# Patient Record
Sex: Female | Born: 1960 | Race: Black or African American | Hispanic: No | Smoking: Light tobacco smoker
Health system: Southern US, Community
[De-identification: ages and names within clinical notes are randomized; demographics above are authoritative.]

## PROBLEM LIST (undated history)

## (undated) DIAGNOSIS — I10 Essential (primary) hypertension: Secondary | ICD-10-CM

## (undated) DIAGNOSIS — I639 Cerebral infarction, unspecified: Secondary | ICD-10-CM

## (undated) HISTORY — PX: CHOLECYSTECTOMY: SHX55

---

## 2018-06-13 ENCOUNTER — Emergency Department (HOSPITAL_COMMUNITY): Payer: Medicaid Other

## 2018-06-13 ENCOUNTER — Emergency Department (HOSPITAL_COMMUNITY)
Admission: EM | Admit: 2018-06-13 | Discharge: 2018-06-14 | Disposition: A | Discharge status: Home / Self Care | Payer: Medicaid Other | Attending: Emergency Medicine | Admitting: Emergency Medicine

## 2018-06-13 ENCOUNTER — Encounter (HOSPITAL_COMMUNITY): Payer: Self-pay | Admitting: Emergency Medicine

## 2018-06-13 DIAGNOSIS — I10 Essential (primary) hypertension: Secondary | ICD-10-CM | POA: Diagnosis not present

## 2018-06-13 DIAGNOSIS — R0789 Other chest pain: Secondary | ICD-10-CM

## 2018-06-13 DIAGNOSIS — N939 Abnormal uterine and vaginal bleeding, unspecified: Secondary | ICD-10-CM | POA: Diagnosis not present

## 2018-06-13 DIAGNOSIS — R079 Chest pain, unspecified: Secondary | ICD-10-CM | POA: Diagnosis not present

## 2018-06-13 DIAGNOSIS — G44209 Tension-type headache, unspecified, not intractable: Secondary | ICD-10-CM

## 2018-06-13 DIAGNOSIS — D649 Anemia, unspecified: Secondary | ICD-10-CM | POA: Diagnosis not present

## 2018-06-13 DIAGNOSIS — R1030 Lower abdominal pain, unspecified: Secondary | ICD-10-CM | POA: Insufficient documentation

## 2018-06-13 DIAGNOSIS — K439 Ventral hernia without obstruction or gangrene: Secondary | ICD-10-CM | POA: Diagnosis not present

## 2018-06-13 DIAGNOSIS — R0602 Shortness of breath: Secondary | ICD-10-CM | POA: Diagnosis not present

## 2018-06-13 DIAGNOSIS — G4489 Other headache syndrome: Secondary | ICD-10-CM | POA: Diagnosis not present

## 2018-06-13 HISTORY — DX: Cerebral infarction, unspecified: I63.9

## 2018-06-13 HISTORY — DX: Essential (primary) hypertension: I10

## 2018-06-13 NOTE — ED Triage Notes (Signed)
Pt c/o non-radiating chest pain that started this morning at 1100, headache started at 1900 tonight. Denies nausea/vomiting/diaphoresis. Hx HTN/ CVA with right sided deficits. Given 324mg  aspirin PTA.

## 2018-06-14 ENCOUNTER — Emergency Department (HOSPITAL_COMMUNITY): Payer: Medicaid Other

## 2018-06-14 DIAGNOSIS — R0602 Shortness of breath: Secondary | ICD-10-CM | POA: Diagnosis not present

## 2018-06-14 DIAGNOSIS — R079 Chest pain, unspecified: Secondary | ICD-10-CM | POA: Diagnosis not present

## 2018-06-14 DIAGNOSIS — K439 Ventral hernia without obstruction or gangrene: Secondary | ICD-10-CM | POA: Diagnosis not present

## 2018-06-14 DIAGNOSIS — R279 Unspecified lack of coordination: Secondary | ICD-10-CM | POA: Diagnosis not present

## 2018-06-14 DIAGNOSIS — Z743 Need for continuous supervision: Secondary | ICD-10-CM | POA: Diagnosis not present

## 2018-06-14 LAB — BASIC METABOLIC PANEL
Anion gap: 9 (ref 5–15)
BUN: 13 mg/dL (ref 6–20)
CHLORIDE: 106 mmol/L (ref 98–111)
CO2: 22 mmol/L (ref 22–32)
CREATININE: 1.77 mg/dL — AB (ref 0.44–1.00)
Calcium: 8.4 mg/dL — ABNORMAL LOW (ref 8.9–10.3)
GFR calc Af Amer: 36 mL/min — ABNORMAL LOW (ref >60)
GFR calc non Af Amer: 31 mL/min — ABNORMAL LOW (ref >60)
GLUCOSE: 105 mg/dL — AB (ref 70–99)
Potassium: 4 mmol/L (ref 3.5–5.1)
Sodium: 137 mmol/L (ref 135–145)

## 2018-06-14 LAB — CBC
HCT: 33.7 % — ABNORMAL LOW (ref 36.0–46.0)
Hemoglobin: 9.9 g/dL — ABNORMAL LOW (ref 12.0–15.0)
MCH: 25.4 pg — AB (ref 26.0–34.0)
MCHC: 29.4 g/dL — ABNORMAL LOW (ref 30.0–36.0)
MCV: 86.6 fL (ref 78.0–100.0)
PLATELETS: 259 10*3/uL (ref 150–400)
RBC: 3.89 MIL/uL (ref 3.87–5.11)
RDW: 15.9 % — ABNORMAL HIGH (ref 11.5–15.5)
WBC: 8.5 10*3/uL (ref 4.0–10.5)

## 2018-06-14 LAB — I-STAT BETA HCG BLOOD, ED (MC, WL, AP ONLY): I-stat hCG, quantitative: <5 m[IU]/mL (ref <5)

## 2018-06-14 LAB — HEPATIC FUNCTION PANEL
ALT: 16 U/L (ref 0–44)
AST: 14 U/L — ABNORMAL LOW (ref 15–41)
Albumin: 3.2 g/dL — ABNORMAL LOW (ref 3.5–5.0)
Alkaline Phosphatase: 67 U/L (ref 38–126)
Total Bilirubin: 0.6 mg/dL (ref 0.3–1.2)
Total Protein: 7.5 g/dL (ref 6.5–8.1)

## 2018-06-14 LAB — I-STAT TROPONIN, ED
TROPONIN I, POC: 0.01 ng/mL (ref 0.00–0.08)
Troponin i, poc: 0 ng/mL (ref 0.00–0.08)

## 2018-06-14 LAB — LIPASE, BLOOD: LIPASE: 25 U/L (ref 11–51)

## 2018-06-14 MED ORDER — AMLODIPINE BESYLATE 5 MG PO TABS: 5.0000 mg | ORAL_TABLET | Freq: Every day | ORAL | 0 refills | Status: AC

## 2018-06-14 MED ORDER — AMLODIPINE BESYLATE 5 MG PO TABS
5.0000 mg | ORAL_TABLET | Freq: Once | ORAL | Status: AC
  Administered 2018-06-14: 5 mg via ORAL
  Filled 2018-06-14: qty 1

## 2018-06-14 MED ORDER — PROCHLORPERAZINE EDISYLATE 10 MG/2ML IJ SOLN
10.0000 mg | Freq: Once | INTRAMUSCULAR | Status: AC
  Administered 2018-06-14: 10 mg via INTRAVENOUS
  Filled 2018-06-14: qty 2

## 2018-06-14 MED ORDER — HYDROCHLOROTHIAZIDE 25 MG PO TABS: 25.0000 mg | ORAL_TABLET | Freq: Every day | ORAL | Status: DC

## 2018-06-14 MED ORDER — DIPHENHYDRAMINE HCL 50 MG/ML IJ SOLN
25.0000 mg | Freq: Once | INTRAMUSCULAR | Status: AC
  Administered 2018-06-14: 25 mg via INTRAVENOUS
  Filled 2018-06-14: qty 1

## 2018-06-14 NOTE — ED Notes (Signed)
Patient transported to CT 

## 2018-06-14 NOTE — ED Notes (Signed)
Signature pad unavailable at time of pt discharge. Pt verbalized understanding of instructions and prescriptions.

## 2018-06-14 NOTE — Discharge Instructions (Addendum)
He was seen today for multiple complaints.  Your work-up is largely reassuring.  You were found to be hypertensive.  You will be started on Norvasc.  You need to establish primary care for blood pressure recheck and adjustment of medications.  Additionally you should follow-up at [facility] given your vaginal bleeding.  You were found to be anemic but her vital signs were otherwise reassuring.  Increase iron intake.  If you have any new or worsening symptoms you should be reevaluated immediately.

## 2018-06-14 NOTE — ED Notes (Signed)
ED Provider at bedside. 

## 2018-06-14 NOTE — ED Provider Notes (Signed)
[place] EMERGENCY DEPARTMENT Provider Note   CSN: [id number] Arrival date & time: 06/13/18  2327     History   Chief Complaint Chief Complaint  Patient presents with  . Chest Pain    HPI Cristina Jordan is a 57 y.o. female.  HPI  This is a 57 year old female with history of morbid obesity, hypertension, stroke who presents with multiple complaints.  Patient reports chest pain, headache, and vaginal bleeding.  Patient reports today she had onset of anterior chest pain.  It was nonradiating.  Describes it as pressure.  No exertional symptoms.  Denies any shortness of breath or cough.  She additionally reports headache onset this afternoon.  It is frontal and nonradiating.  Rates her pain at 10 out of 10.  She took an Excedrin with no relief.  Denies neck pain or fevers.  Additionally, patient states that 10 years ago she had a "procedure" and has not had a.  Since that time.  However 2 to 3 months ago she began to have vaginal bleeding.  At times it is heavy with clots.  Sometimes she will go a day or 2 without bleeding but she has had continuous bleeding over the last 2 to 3 months.  She states in general she has not felt well since that time.  She does report some crampy lower abdominal pain.  Denies any nausea, vomiting, diarrhea.  Past Medical History:  Diagnosis Date  . Hypertension   . Stroke [facility])     There are no active problems to display for this patient.   Past Surgical History:  Procedure Laterality Date  . CHOLECYSTECTOMY       OB History   None      Home Medications    Prior to Admission medications   Medication Sig Start Date End Date Taking? Authorizing Provider  amLODipine (NORVASC) 5 MG tablet Take 1 tablet (5 mg total) by mouth daily. 06/14/18   Horton, Barbette Hair, MD    Family History No family history on file.  Social History Social History   Tobacco Use  . Smoking status: Never Smoker  . Smokeless tobacco: Never Used    Substance Use Topics  . Alcohol use: Not Currently    Frequency: Never  . Drug use: Never     Allergies   Patient has no known allergies.   Review of Systems Review of Systems  Constitutional: Negative for fever.  Eyes: Negative for photophobia.  Respiratory: Negative for shortness of breath.   Cardiovascular: Positive for chest pain. Negative for leg swelling.  Gastrointestinal: Positive for abdominal pain. Negative for diarrhea, nausea and vomiting.  Genitourinary: Positive for vaginal bleeding.  Musculoskeletal: Negative for neck pain and neck stiffness.  Skin: Negative for color change and wound.  Neurological: Positive for headaches. Negative for dizziness, weakness and numbness.  All other systems reviewed and are negative.    Physical Exam Updated Vital Signs BP (!) 177/74   Pulse 84   Temp 98.5 F (36.9 C) (Oral)   Resp 19   Ht 5\' 6"  (1.676 m)   Wt (!) 178.3 kg (393 lb)   SpO2 100%   BMI 63.43 kg/m   Physical Exam  Constitutional: She is oriented to person, place, and time.  Chronically ill-appearing, morbidly obese  HENT:  Head: Normocephalic and atraumatic.  Eyes: Pupils are equal, round, and reactive to light.  Neck: Normal range of motion. Neck supple.  Cardiovascular: Normal rate, regular rhythm, normal heart sounds and  normal pulses.  Pulmonary/Chest: Effort normal. No respiratory distress. She has no wheezes.  Limited secondary to body habitus  Abdominal: Soft. Bowel sounds are normal. There is no tenderness.  Musculoskeletal:       Right lower leg: She exhibits edema.       Left lower leg: She exhibits edema.  Neurological: She is alert and oriented to person, place, and time.  Speech is somewhat slowed, 5 out of 5 strength bilateral upper and lower extremities  Skin: Skin is warm and dry.  Psychiatric: She has a normal mood and affect.  Nursing note and vitals reviewed.    ED Treatments / Results  Labs (all labs ordered are listed, but  only abnormal results are displayed) Labs Reviewed  BASIC METABOLIC PANEL - Abnormal; Notable for the following components:      Result Value   Glucose, Bld 105 (*)    Creatinine, Ser 1.77 (*)    Calcium 8.4 (*)    GFR calc non Af Amer 31 (*)    GFR calc Af Amer 36 (*)    All other components within normal limits  CBC - Abnormal; Notable for the following components:   Hemoglobin 9.9 (*)    HCT 33.7 (*)    MCH 25.4 (*)    MCHC 29.4 (*)    RDW 15.9 (*)    All other components within normal limits  HEPATIC FUNCTION PANEL - Abnormal; Notable for the following components:   Albumin 3.2 (*)    AST 14 (*)    All other components within normal limits  LIPASE, BLOOD  I-STAT TROPONIN, ED  I-STAT BETA HCG BLOOD, ED ([facility], [facility], [facility] ONLY)  I-STAT TROPONIN, ED    EKG EKG Interpretation  Date/Time:  Tuesday June 13 2018 23:30:43 EDT Ventricular Rate:  92 PR Interval:    QRS Duration: 109 QT Interval:  396 QTC Calculation: 490 R Axis:   43 Text Interpretation:  Sinus rhythm Borderline prolonged PR interval Low voltage, precordial leads Borderline prolonged QT interval No prior for comparison Confirmed by Thayer Jew [phone]) on 06/14/2018 12:12:41 AM   Radiology Ct Abdomen Pelvis Wo Contrast  Result Date: 06/14/2018 CLINICAL DATA:  Chest and left-sided abdominal pain EXAM: CT ABDOMEN AND PELVIS WITHOUT CONTRAST TECHNIQUE: Multidetector CT imaging of the abdomen and pelvis was performed following the standard protocol without IV contrast. COMPARISON:  Chest x-ray 06/13/2018 FINDINGS: Lower chest: Lung bases demonstrate no acute consolidation or effusion. Mild cardiomegaly. Small hiatal hernia Hepatobiliary: No focal liver abnormality is seen. Status post cholecystectomy. No biliary dilatation. Pancreas: Unremarkable. No pancreatic ductal dilatation or surrounding inflammatory changes. Spleen: Normal in size without focal abnormality. Adrenals/Urinary Tract: Adrenal glands are unremarkable.  Kidneys are normal, without renal calculi, focal lesion, or hydronephrosis. Bladder is unremarkable. Stomach/Bowel: Stomach is within normal limits. Appendix appears normal. No evidence of bowel wall thickening, distention, or inflammatory changes. Sigmoid colon diverticular disease without acute inflammation. Vascular/Lymphatic: Mild aortic atherosclerosis without aneurysm. No significantly enlarged lymph nodes Reproductive: Calcified fibroid within the left uterus. No adnexal mass. Other: Negative for free air or free fluid. Umbilical and periumbilical hernia. Midline ventral hernia contains fat. Right paramedian hernia contains a small portion of small bowel. No obstruction identified. Musculoskeletal: Degenerative changes. No acute or suspicious abnormality IMPRESSION: 1. No CT evidence for acute intra-abdominal or pelvic abnormality 2. Mild cardiomegaly 3. Sigmoid colon diverticular disease without acute inflammation 4. Calcified fibroid in the uterus 5. Ventral hernias. Right paramedian ventral hernia contains a small  amount of small bowel but there is no evidence for obstruction or incarceration. Electronically Signed   By: Donavan Foil M.D.   On: 06/14/2018 02:36   Dg Chest 2 View  Result Date: 06/14/2018 CLINICAL DATA:  Chest pain and shortness of breath for 1 day. History of stroke, hypertension, nonsmoker. EXAM: CHEST - 2 VIEW COMPARISON:  None. FINDINGS: Shallow inspiration with vascular crowding in the lung bases. Heart size is prominent but likely normal for technique. No vascular congestion. No edema or consolidation. No blunting of costophrenic angles. No pneumothorax. Mediastinal contours appear intact. IMPRESSION: No active cardiopulmonary disease. Electronically Signed   By: Lucienne Capers M.D.   On: 06/14/2018 00:08    Procedures Procedures (including critical care time)  Medications Ordered in ED Medications  hydrochlorothiazide (HYDRODIURIL) tablet 25 mg (has no administration in  time range)  prochlorperazine (COMPAZINE) injection 10 mg (10 mg Intravenous Given 06/14/18 0021)  diphenhydrAMINE (BENADRYL) injection 25 mg (25 mg Intravenous Given 06/14/18 0021)  amLODipine (NORVASC) tablet 5 mg (5 mg Oral Given 06/14/18 0413)     Initial Impression / Assessment and Plan / ED Course  I have reviewed the triage vital signs and the nursing notes.  Pertinent labs & imaging results that were available during my care of the patient were reviewed by me and considered in my medical decision making (see chart for details).  Clinical Course as of Jun 15 447  Wed Jun 14, 2018  0405 She reports improvement of headache with migraine cocktail.  She is overall nontoxic-appearing.  She has been hypertensive.  Reports that her medications got lost during her move.  She is unsure what she used to take.  We will have pharmacy evaluate.   [CH]    Clinical Course User Index [CH] Horton, Barbette Hair, MD    Patient presents with multiple complaints.  She has never been seen in our system headache and chest pain is her primary complaints.  Also reports ongoing vaginal bleeding for the last 2 months.  She is overall nontoxic-appearing.  Notably hypertensive upon arrival.  Patient was given a migraine cocktail.  No other red flags of headache.  No signs or symptoms of meningitis.  Doubt subarachnoid hemorrhage.  Regarding her chest pain, she certainly has risk factors for ACS including obesity, hypertension.  EKG shows no evidence of acute ischemia.  Chest x-ray without pneumothorax or pneumonia.  Troponin x2-.  Basic lab work reveals a hemoglobin of 9.9.  Unknown baseline.  She does not appear symptomatic as her heart rate is 88 and her blood pressure is reassuring.  Additionally she has creatinine of 1.77.  Again, unknown baseline.  Patient remained hypertensive while in the ED.  Reports she has not had a blood pressure medication since moving to [place].  She does not know what she used to take.   Patient was given Norvasc and HCTZ.  Will initiate Norvasc.  I have encouraged patient to follow-up to establish primary care for repeat blood pressure check and medication adjustment.  Additionally, pelvic exam was deferred given multiple complaints and chronicity of symptoms.  However, follow-up with limited hospital recommended.  Given her age, she likely needs a vaginal ultrasound to rule out malignancy or abnormal endometrial lining.  Patient stated understanding.  After history, exam, and medical workup I feel the patient has been appropriately medically screened and is safe for discharge home. Pertinent diagnoses were discussed with the patient. Patient was given return precautions.   Final Clinical Impressions(s) /  ED Diagnoses   Final diagnoses:  Atypical chest pain  Acute non intractable tension-type headache  Anemia, unspecified type  Essential hypertension    ED Discharge Orders        Ordered    amLODipine (NORVASC) 5 MG tablet  Daily     06/14/18 0446       [name], MD 06/14/18 [phone]

## 2018-06-14 NOTE — ED Notes (Signed)
Cristina Jordan, Network engineer, to call [facility].

## 2020-05-27 ENCOUNTER — Encounter (HOSPITAL_COMMUNITY): Payer: Self-pay

## 2020-05-27 ENCOUNTER — Emergency Department (HOSPITAL_COMMUNITY): Payer: Medicaid Other

## 2020-05-27 ENCOUNTER — Emergency Department (HOSPITAL_COMMUNITY)
Admission: EM | Admit: 2020-05-27 | Discharge: 2020-05-28 | Disposition: A | Discharge status: Home / Self Care | Payer: Medicaid Other | Attending: Emergency Medicine | Admitting: Emergency Medicine

## 2020-05-27 ENCOUNTER — Other Ambulatory Visit: Payer: Self-pay

## 2020-05-27 DIAGNOSIS — S43004A Unspecified dislocation of right shoulder joint, initial encounter: Secondary | ICD-10-CM | POA: Diagnosis not present

## 2020-05-27 DIAGNOSIS — S0993XA Unspecified injury of face, initial encounter: Secondary | ICD-10-CM

## 2020-05-27 DIAGNOSIS — Z8673 Personal history of transient ischemic attack (TIA), and cerebral infarction without residual deficits: Secondary | ICD-10-CM | POA: Insufficient documentation

## 2020-05-27 DIAGNOSIS — Y9301 Activity, walking, marching and hiking: Secondary | ICD-10-CM | POA: Insufficient documentation

## 2020-05-27 DIAGNOSIS — Y92002 Bathroom of unspecified non-institutional (private) residence single-family (private) house as the place of occurrence of the external cause: Secondary | ICD-10-CM | POA: Insufficient documentation

## 2020-05-27 DIAGNOSIS — S00502A Unspecified superficial injury of oral cavity, initial encounter: Secondary | ICD-10-CM | POA: Diagnosis not present

## 2020-05-27 DIAGNOSIS — W19XXXA Unspecified fall, initial encounter: Secondary | ICD-10-CM | POA: Diagnosis not present

## 2020-05-27 DIAGNOSIS — S4991XA Unspecified injury of right shoulder and upper arm, initial encounter: Secondary | ICD-10-CM | POA: Diagnosis not present

## 2020-05-27 DIAGNOSIS — S43011A Anterior subluxation of right humerus, initial encounter: Secondary | ICD-10-CM | POA: Diagnosis not present

## 2020-05-27 DIAGNOSIS — R52 Pain, unspecified: Secondary | ICD-10-CM | POA: Diagnosis not present

## 2020-05-27 DIAGNOSIS — Z4789 Encounter for other orthopedic aftercare: Secondary | ICD-10-CM | POA: Diagnosis not present

## 2020-05-27 DIAGNOSIS — I10 Essential (primary) hypertension: Secondary | ICD-10-CM | POA: Insufficient documentation

## 2020-05-27 DIAGNOSIS — Y998 Other external cause status: Secondary | ICD-10-CM | POA: Diagnosis not present

## 2020-05-27 DIAGNOSIS — Z79899 Other long term (current) drug therapy: Secondary | ICD-10-CM | POA: Diagnosis not present

## 2020-05-27 DIAGNOSIS — M25519 Pain in unspecified shoulder: Secondary | ICD-10-CM | POA: Diagnosis not present

## 2020-05-27 NOTE — ED Triage Notes (Signed)
Pt fell at home while trying to transfer from her wheelchair, she fell on the right side of her body and complains of right shoulder pain, right face pain Pt had a stroke in 2017 with right sided deficit

## 2020-05-27 NOTE — ED Provider Notes (Signed)
[place] DEPT Provider Note   CSN: [id number] Arrival date & time: 05/27/20  2002     History Chief Complaint  Patient presents with  . Fall    Cristina Jordan is a 59 y.o. female.  The history is provided by the patient and medical records.   59 year old female with history of hypertension and prior stroke with residual right-sided deficits, presenting to the ED after a fall.  States she was walking to the bathroom with her daughter, daughter lost her footing causing patient to fall on her right side.  She fell directly on right shoulder and struck her face on the floor.  There was no loss of consciousness.  She reports she broke one of her upper teeth and knocked out one of her lower teeth, now feels like she has some swelling of her gums.  She mostly reports right shoulder pain and upper arm pain.  She cannot distinguish any new numbness or weakness, she does have decreased sensation at baseline from prior stroke.  She is right-hand dominant.  Past Medical History:  Diagnosis Date  . Hypertension   . Stroke [facility])     There are no problems to display for this patient.   Past Surgical History:  Procedure Laterality Date  . CHOLECYSTECTOMY       OB History   No obstetric history on file.     History reviewed. No pertinent family history.  Social History   Tobacco Use  . Smoking status: Never Smoker  . Smokeless tobacco: Never Used  Substance Use Topics  . Alcohol use: Not Currently  . Drug use: Never    Home Medications Prior to Admission medications   Medication Sig Start Date End Date Taking? Authorizing Provider  acetaminophen (TYLENOL) 500 MG tablet Take 500 mg by mouth every 6 (six) hours as needed for moderate pain.   Yes [provider]  amLODipine (NORVASC) 5 MG tablet Take 1 tablet (5 mg total) by mouth daily. Patient not taking: Reported on 05/27/2020 06/14/18   Horton, Barbette Hair, MD    Allergies    Patient  has no known allergies.  Review of Systems   Review of Systems  HENT: Positive for dental problem.   Musculoskeletal: Positive for arthralgias.  All other systems reviewed and are negative.   Physical Exam Updated Vital Signs BP (!) 146/114 (BP Location: Left Arm)   Pulse 94   Temp 98.8 F (37.1 C) (Oral)   Resp 18   Ht 5' 6.5" (1.689 m)   Wt (!) 176.9 kg   SpO2 100%   BMI 62.00 kg/m   Physical Exam Vitals and nursing note reviewed.  Constitutional:      Appearance: She is well-developed.  HENT:     Head: Normocephalic and atraumatic.     Comments: No visible head trauma    Mouth/Throat:     Comments: Teeth largely in poor dentition, left upper canine is broken horizontally and right lower canine is absent, no active bleeding from these sites, no signs of dental infection, handling secretions appropriately, no trismus, no facial or neck swelling, normal phonation without stridor      Eyes:     Conjunctiva/sclera: Conjunctivae normal.     Pupils: Pupils are equal, round, and reactive to light.  Cardiovascular:     Rate and Rhythm: Normal rate and regular rhythm.     Heart sounds: Normal heart sounds.  Pulmonary:     Effort: Pulmonary effort is normal. No  respiratory distress.     Breath sounds: Normal breath sounds. No rhonchi.  Abdominal:     General: Bowel sounds are normal.     Palpations: Abdomen is soft.     Tenderness: There is no abdominal tenderness. There is no rebound.  Musculoskeletal:        General: Normal range of motion.     Cervical back: Normal range of motion.     Comments: Right shoulder is grossly normal in appearance, there is no significant deformity noted, joint spaces feel overall symmetric when compared with left, she has limited passive range of motion of the right arm, does appear to have some baseline spasticity, radial pulse intact  Skin:    General: Skin is warm and dry.  Neurological:     Mental Status: She is alert and oriented to  person, place, and time.     Comments: AAOx3, some mild dysarthria noted      ED Results / Procedures / Treatments   Labs (all labs ordered are listed, but only abnormal results are displayed) Labs Reviewed - No data to display  EKG None  Radiology DG Shoulder Right  Result Date: 05/28/2020 CLINICAL DATA:  Postreduction EXAM: RIGHT SHOULDER - 2+ VIEW COMPARISON:  None. FINDINGS: The humeral head appears to be seated within the glenoid. No definite fracture seen. There is diffuse osteopenia. IMPRESSION: Interval reduction without acute osseous abnormality. Electronically Signed   By: Prudencio Pair M.D.   On: 05/28/2020 00:29   DG Shoulder Right  Result Date: 05/27/2020 CLINICAL DATA:  Shoulder pain fall at home EXAM: RIGHT SHOULDER - 2+ VIEW COMPARISON:  None. FINDINGS: There appears to be mild anterior subluxation of the humeral head, however somewhat limited due to patient position. There is diffuse osteopenia. No definite displaced fracture is noted. IMPRESSION: anterior subluxation of the humeral head. Electronically Signed   By: Prudencio Pair M.D.   On: 05/27/2020 23:07   DG Humerus Right  Result Date: 05/27/2020 CLINICAL DATA:  Fall pain EXAM: RIGHT HUMERUS - 2+ VIEW COMPARISON:  None. FINDINGS: No definite fracture or dislocation. There is diffuse osteopenia. There appears to be slight anterior of subluxation of the humeral head. IMPRESSION: Slight anterior subluxation of the humeral head. No definite fracture. Electronically Signed   By: Prudencio Pair M.D.   On: 05/27/2020 23:08   CT Maxillofacial Wo Contrast  Result Date: 05/27/2020 CLINICAL DATA:  Facial trauma, fall at home EXAM: CT MAXILLOFACIAL WITHOUT CONTRAST TECHNIQUE: Multidetector CT imaging of the maxillofacial structures was performed. Multiplanar CT image reconstructions were also generated. COMPARISON:  None. FINDINGS: Osseous: No acute fracture or other significant osseous abnormality.The nasal bone, mandibles,  zygomatic arches and pterygoid plates are intact. There is however linear lucency seen through the right lower lateral incisor and cuspid. With periapical lucency seen throughout the remainder of the teeth. There is poor dentition with missing right upper central incisor. There also appears to be a probable dental caries seen within the right upper first cuspid, best seen on series 9, image 20. Orbits: No fracture identified. Unremarkable appearance of globes and orbits. Sinuses: The visualized paranasal sinuses and mastoid air cells are unremarkable. Soft tissues:  No acute findings. Limited intracranial: No acute findings. IMPRESSION: 1. Possible nondisplaced fracture seen through the right lower lateral incisor and cuspid teeth. 2. No other acute facial fracture seen. Electronically Signed   By: Prudencio Pair M.D.   On: 05/27/2020 23:43    Procedures Reduction of dislocation  Date/Time: 05/28/2020  1:07 AM Performed by: Larene Pickett, PA-C Authorized by: Larene Pickett, PA-C  Consent: Verbal consent obtained. Risks and benefits: risks, benefits and alternatives were discussed Consent given by: patient Patient understanding: patient states understanding of the procedure being performed Required items: required blood products, implants, devices, and special equipment available Patient identity confirmed: verbally with patient Time out: Immediately prior to procedure a "time out" was called to verify the correct patient, procedure, equipment, support staff and site/side marked as required. Preparation: Patient was prepped and draped in the usual sterile fashion. Local anesthesia used: no  Anesthesia: Local anesthesia used: no  Sedation: Patient sedated: no  Patient tolerance: patient tolerated the procedure well with no immediate complications Comments: Right shoulder manipulated with traction and massage technique, tolerated well    (including critical care time)  Medications Ordered  in ED Medications - No data to display  ED Course  I have reviewed the triage vital signs and the nursing notes.  Pertinent labs & imaging results that were available during my care of the patient were reviewed by me and considered in my medical decision making (see chart for details).    MDM Rules/Calculators/A&P  59 year old female presenting to the ED after a fall.  Patient reports she was walking to the bathroom with her daughter, daughter lost her footing and she fell right side.  Impact on right shoulder, she did hit her face on the floor but denies any loss of consciousness.  Her biggest complaint is right shoulder pain and some gum swelling.  She did knock out one of her lower teeth during fall and cracked an upper tooth.  No bleeding noted on exam.  She is awake, alert, oriented to baseline.  Do not feel she needs emergent head CT at this time.  Facial CT with dental fractures but no jaw fracture or dislocation noted.  Shoulder films with subluxation of right shoulder.  12:13 AM Have manipulated shoulder at bedside with Dr. Ronnald Nian-- no significant subluxation or dislocation noted on exam.  She has fairly good passive ROM, not able to range on her own due to prior stroke.  After manipulation, states it feels sore but better than when she arrived.  Will repeat x-ray, may ultimately be positional.  Repeat x-ray with improved alignment.  Will place in shoulder sling and have her follow-up with orthopedics.  Will also refer to dentist for broken teeth.  She may return here for any new or acute changes.  Final Clinical Impression(s) / ED Diagnoses Final diagnoses:  Fall  Injury of right shoulder, initial encounter  Dental injury, initial encounter    Rx / DC Orders ED Discharge Orders    None       Larene Pickett, PA-C 05/28/20 [place], [place], DO 05/28/20 1648

## 2020-05-27 NOTE — ED Notes (Signed)
Patient transported to X-ray 

## 2020-05-28 ENCOUNTER — Emergency Department (HOSPITAL_COMMUNITY): Payer: Medicaid Other

## 2020-05-28 DIAGNOSIS — S43004A Unspecified dislocation of right shoulder joint, initial encounter: Secondary | ICD-10-CM | POA: Diagnosis not present

## 2020-05-28 DIAGNOSIS — Z4789 Encounter for other orthopedic aftercare: Secondary | ICD-10-CM | POA: Diagnosis not present

## 2020-05-28 DIAGNOSIS — I10 Essential (primary) hypertension: Secondary | ICD-10-CM | POA: Diagnosis not present

## 2020-05-28 DIAGNOSIS — Z7401 Bed confinement status: Secondary | ICD-10-CM | POA: Diagnosis not present

## 2020-05-28 DIAGNOSIS — M2550 Pain in unspecified joint: Secondary | ICD-10-CM | POA: Diagnosis not present

## 2020-05-28 DIAGNOSIS — R5381 Other malaise: Secondary | ICD-10-CM | POA: Diagnosis not present

## 2020-05-28 NOTE — ED Provider Notes (Signed)
Medical screening examination/treatment/procedure(s) were conducted as a shared visit with non-physician practitioner(s) and myself.  I personally evaluated the patient during the encounter. Briefly, the patient is a 59 y.o. female with history of stroke with right-sided weakness who presents to the ED after a fall.  Mechanical fall while transferring from her wheelchair.  Pain to the right shoulder and face.  Possible mild subluxation of the right shoulder.  Was reduced at the bedside by my physician associate.  Repeat x-ray shows improved alignment of right shoulder.  No sedation was needed.  Neurovascularly intact.  CT of the face shows some dental fractures we will have her follow-up with dentistry.  There were no obvious loose teeth on exam.  She is neurologically intact otherwise.  Will place in a sling and discharged in good condition.  This chart was dictated using voice recognition software.  Despite best efforts to proofread,  errors can occur which can change the documentation meaning.     EKG Interpretation None           Lennice Sites, DO 05/28/20 [phone]

## 2020-05-28 NOTE — ED Notes (Signed)
[facility] concerned about Pt's blood pressure.  Dr. Leonette Monarch reassessed Pt and deemed her appropriate for d/c.

## 2020-05-28 NOTE — Discharge Instructions (Signed)
I would wear shoulder sling for now, modify movement of right upper extremity for now.  Gradually start moving as you can. You can follow-up with Dr. Erlinda Hong about your shoulder. I would also see dentist about your teeth-- you can call in the morning for appt. Return here for any new/acute changes.

## 2020-06-04 ENCOUNTER — Ambulatory Visit: Payer: Medicaid Other | Admitting: Orthopaedic Surgery

## 2020-08-16 ENCOUNTER — Other Ambulatory Visit: Payer: Self-pay

## 2020-08-16 ENCOUNTER — Emergency Department (HOSPITAL_COMMUNITY): Payer: Medicaid Other

## 2020-08-16 ENCOUNTER — Inpatient Hospital Stay (HOSPITAL_COMMUNITY)
Admission: EM | Admit: 2020-08-16 | Discharge: 2020-10-13 | DRG: 335 | Disposition: E | Payer: Medicaid Other | Attending: Internal Medicine | Admitting: Internal Medicine

## 2020-08-16 ENCOUNTER — Encounter (HOSPITAL_COMMUNITY): Payer: Self-pay

## 2020-08-16 DIAGNOSIS — M6282 Rhabdomyolysis: Secondary | ICD-10-CM | POA: Diagnosis not present

## 2020-08-16 DIAGNOSIS — D62 Acute posthemorrhagic anemia: Secondary | ICD-10-CM | POA: Diagnosis not present

## 2020-08-16 DIAGNOSIS — I69351 Hemiplegia and hemiparesis following cerebral infarction affecting right dominant side: Secondary | ICD-10-CM | POA: Diagnosis not present

## 2020-08-16 DIAGNOSIS — E440 Moderate protein-calorie malnutrition: Secondary | ICD-10-CM | POA: Insufficient documentation

## 2020-08-16 DIAGNOSIS — K660 Peritoneal adhesions (postprocedural) (postinfection): Secondary | ICD-10-CM | POA: Diagnosis present

## 2020-08-16 DIAGNOSIS — R1110 Vomiting, unspecified: Secondary | ICD-10-CM

## 2020-08-16 DIAGNOSIS — K269 Duodenal ulcer, unspecified as acute or chronic, without hemorrhage or perforation: Secondary | ICD-10-CM | POA: Diagnosis not present

## 2020-08-16 DIAGNOSIS — K649 Unspecified hemorrhoids: Secondary | ICD-10-CM | POA: Diagnosis not present

## 2020-08-16 DIAGNOSIS — N939 Abnormal uterine and vaginal bleeding, unspecified: Secondary | ICD-10-CM | POA: Diagnosis not present

## 2020-08-16 DIAGNOSIS — I129 Hypertensive chronic kidney disease with stage 1 through stage 4 chronic kidney disease, or unspecified chronic kidney disease: Secondary | ICD-10-CM | POA: Diagnosis not present

## 2020-08-16 DIAGNOSIS — N1832 Chronic kidney disease, stage 3b: Secondary | ICD-10-CM | POA: Diagnosis present

## 2020-08-16 DIAGNOSIS — M79605 Pain in left leg: Secondary | ICD-10-CM | POA: Diagnosis not present

## 2020-08-16 DIAGNOSIS — D649 Anemia, unspecified: Secondary | ICD-10-CM | POA: Diagnosis not present

## 2020-08-16 DIAGNOSIS — N170 Acute kidney failure with tubular necrosis: Secondary | ICD-10-CM | POA: Diagnosis not present

## 2020-08-16 DIAGNOSIS — U071 COVID-19: Secondary | ICD-10-CM | POA: Diagnosis not present

## 2020-08-16 DIAGNOSIS — K529 Noninfective gastroenteritis and colitis, unspecified: Secondary | ICD-10-CM

## 2020-08-16 DIAGNOSIS — K430 Incisional hernia with obstruction, without gangrene: Secondary | ICD-10-CM | POA: Diagnosis present

## 2020-08-16 DIAGNOSIS — Z4659 Encounter for fitting and adjustment of other gastrointestinal appliance and device: Secondary | ICD-10-CM

## 2020-08-16 DIAGNOSIS — Z515 Encounter for palliative care: Secondary | ICD-10-CM | POA: Diagnosis not present

## 2020-08-16 DIAGNOSIS — I517 Cardiomegaly: Secondary | ICD-10-CM | POA: Diagnosis not present

## 2020-08-16 DIAGNOSIS — J811 Chronic pulmonary edema: Secondary | ICD-10-CM | POA: Diagnosis not present

## 2020-08-16 DIAGNOSIS — J9601 Acute respiratory failure with hypoxia: Secondary | ICD-10-CM | POA: Diagnosis not present

## 2020-08-16 DIAGNOSIS — Z7401 Bed confinement status: Secondary | ICD-10-CM | POA: Diagnosis not present

## 2020-08-16 DIAGNOSIS — N184 Chronic kidney disease, stage 4 (severe): Secondary | ICD-10-CM | POA: Diagnosis not present

## 2020-08-16 DIAGNOSIS — Z993 Dependence on wheelchair: Secondary | ICD-10-CM | POA: Diagnosis not present

## 2020-08-16 DIAGNOSIS — F17210 Nicotine dependence, cigarettes, uncomplicated: Secondary | ICD-10-CM | POA: Diagnosis present

## 2020-08-16 DIAGNOSIS — K8689 Other specified diseases of pancreas: Secondary | ICD-10-CM | POA: Diagnosis not present

## 2020-08-16 DIAGNOSIS — Z20822 Contact with and (suspected) exposure to covid-19: Secondary | ICD-10-CM | POA: Diagnosis present

## 2020-08-16 DIAGNOSIS — A09 Infectious gastroenteritis and colitis, unspecified: Secondary | ICD-10-CM | POA: Diagnosis not present

## 2020-08-16 DIAGNOSIS — Z66 Do not resuscitate: Secondary | ICD-10-CM

## 2020-08-16 DIAGNOSIS — D539 Nutritional anemia, unspecified: Secondary | ICD-10-CM | POA: Diagnosis present

## 2020-08-16 DIAGNOSIS — I69361 Other paralytic syndrome following cerebral infarction affecting right dominant side: Secondary | ICD-10-CM | POA: Diagnosis not present

## 2020-08-16 DIAGNOSIS — Z79899 Other long term (current) drug therapy: Secondary | ICD-10-CM

## 2020-08-16 DIAGNOSIS — K224 Dyskinesia of esophagus: Secondary | ICD-10-CM | POA: Diagnosis present

## 2020-08-16 DIAGNOSIS — E162 Hypoglycemia, unspecified: Secondary | ICD-10-CM | POA: Diagnosis present

## 2020-08-16 DIAGNOSIS — E869 Volume depletion, unspecified: Secondary | ICD-10-CM | POA: Diagnosis not present

## 2020-08-16 DIAGNOSIS — I10 Essential (primary) hypertension: Secondary | ICD-10-CM | POA: Diagnosis not present

## 2020-08-16 DIAGNOSIS — N179 Acute kidney failure, unspecified: Secondary | ICD-10-CM

## 2020-08-16 DIAGNOSIS — E559 Vitamin D deficiency, unspecified: Secondary | ICD-10-CM | POA: Diagnosis present

## 2020-08-16 DIAGNOSIS — R0602 Shortness of breath: Secondary | ICD-10-CM | POA: Diagnosis not present

## 2020-08-16 DIAGNOSIS — R1010 Upper abdominal pain, unspecified: Secondary | ICD-10-CM | POA: Diagnosis not present

## 2020-08-16 DIAGNOSIS — K641 Second degree hemorrhoids: Secondary | ICD-10-CM | POA: Diagnosis present

## 2020-08-16 DIAGNOSIS — G9341 Metabolic encephalopathy: Secondary | ICD-10-CM | POA: Diagnosis not present

## 2020-08-16 DIAGNOSIS — D631 Anemia in chronic kidney disease: Secondary | ICD-10-CM | POA: Diagnosis present

## 2020-08-16 DIAGNOSIS — K5730 Diverticulosis of large intestine without perforation or abscess without bleeding: Secondary | ICD-10-CM | POA: Diagnosis present

## 2020-08-16 DIAGNOSIS — K56609 Unspecified intestinal obstruction, unspecified as to partial versus complete obstruction: Secondary | ICD-10-CM | POA: Diagnosis not present

## 2020-08-16 DIAGNOSIS — D500 Iron deficiency anemia secondary to blood loss (chronic): Secondary | ICD-10-CM

## 2020-08-16 DIAGNOSIS — N898 Other specified noninflammatory disorders of vagina: Secondary | ICD-10-CM | POA: Diagnosis not present

## 2020-08-16 DIAGNOSIS — E872 Acidosis: Secondary | ICD-10-CM | POA: Diagnosis present

## 2020-08-16 DIAGNOSIS — R1915 Other abnormal bowel sounds: Secondary | ICD-10-CM

## 2020-08-16 DIAGNOSIS — R531 Weakness: Secondary | ICD-10-CM

## 2020-08-16 DIAGNOSIS — Z7189 Other specified counseling: Secondary | ICD-10-CM | POA: Diagnosis not present

## 2020-08-16 DIAGNOSIS — K2289 Other specified disease of esophagus: Secondary | ICD-10-CM | POA: Diagnosis present

## 2020-08-16 DIAGNOSIS — E876 Hypokalemia: Secondary | ICD-10-CM | POA: Diagnosis not present

## 2020-08-16 DIAGNOSIS — I468 Cardiac arrest due to other underlying condition: Secondary | ICD-10-CM | POA: Diagnosis not present

## 2020-08-16 DIAGNOSIS — K2950 Unspecified chronic gastritis without bleeding: Secondary | ICD-10-CM | POA: Diagnosis not present

## 2020-08-16 DIAGNOSIS — R079 Chest pain, unspecified: Secondary | ICD-10-CM | POA: Diagnosis not present

## 2020-08-16 DIAGNOSIS — I9589 Other hypotension: Secondary | ICD-10-CM | POA: Diagnosis not present

## 2020-08-16 DIAGNOSIS — K644 Residual hemorrhoidal skin tags: Secondary | ICD-10-CM | POA: Diagnosis present

## 2020-08-16 DIAGNOSIS — R5383 Other fatigue: Secondary | ICD-10-CM

## 2020-08-16 DIAGNOSIS — R5381 Other malaise: Secondary | ICD-10-CM | POA: Diagnosis present

## 2020-08-16 DIAGNOSIS — R112 Nausea with vomiting, unspecified: Secondary | ICD-10-CM | POA: Diagnosis not present

## 2020-08-16 DIAGNOSIS — R6881 Early satiety: Secondary | ICD-10-CM | POA: Diagnosis not present

## 2020-08-16 DIAGNOSIS — G1220 Motor neuron disease, unspecified: Secondary | ICD-10-CM | POA: Diagnosis not present

## 2020-08-16 DIAGNOSIS — K449 Diaphragmatic hernia without obstruction or gangrene: Secondary | ICD-10-CM

## 2020-08-16 DIAGNOSIS — D72829 Elevated white blood cell count, unspecified: Secondary | ICD-10-CM | POA: Diagnosis not present

## 2020-08-16 DIAGNOSIS — N1330 Unspecified hydronephrosis: Secondary | ICD-10-CM | POA: Diagnosis not present

## 2020-08-16 DIAGNOSIS — Z9911 Dependence on respirator [ventilator] status: Secondary | ICD-10-CM | POA: Diagnosis not present

## 2020-08-16 DIAGNOSIS — Z789 Other specified health status: Secondary | ICD-10-CM

## 2020-08-16 DIAGNOSIS — K2980 Duodenitis without bleeding: Secondary | ICD-10-CM | POA: Diagnosis not present

## 2020-08-16 DIAGNOSIS — R9431 Abnormal electrocardiogram [ECG] [EKG]: Secondary | ICD-10-CM | POA: Diagnosis not present

## 2020-08-16 DIAGNOSIS — J90 Pleural effusion, not elsewhere classified: Secondary | ICD-10-CM | POA: Diagnosis not present

## 2020-08-16 DIAGNOSIS — K5669 Other partial intestinal obstruction: Secondary | ICD-10-CM | POA: Diagnosis not present

## 2020-08-16 DIAGNOSIS — Z6841 Body Mass Index (BMI) 40.0 and over, adult: Secondary | ICD-10-CM | POA: Diagnosis not present

## 2020-08-16 DIAGNOSIS — Z9049 Acquired absence of other specified parts of digestive tract: Secondary | ICD-10-CM | POA: Diagnosis not present

## 2020-08-16 DIAGNOSIS — R1310 Dysphagia, unspecified: Secondary | ICD-10-CM | POA: Diagnosis not present

## 2020-08-16 DIAGNOSIS — I69320 Aphasia following cerebral infarction: Secondary | ICD-10-CM

## 2020-08-16 DIAGNOSIS — R109 Unspecified abdominal pain: Secondary | ICD-10-CM | POA: Diagnosis not present

## 2020-08-16 DIAGNOSIS — I493 Ventricular premature depolarization: Secondary | ICD-10-CM | POA: Diagnosis present

## 2020-08-16 DIAGNOSIS — I69392 Facial weakness following cerebral infarction: Secondary | ICD-10-CM

## 2020-08-16 DIAGNOSIS — K439 Ventral hernia without obstruction or gangrene: Secondary | ICD-10-CM | POA: Diagnosis not present

## 2020-08-16 DIAGNOSIS — R579 Shock, unspecified: Secondary | ICD-10-CM | POA: Diagnosis not present

## 2020-08-16 DIAGNOSIS — E861 Hypovolemia: Secondary | ICD-10-CM | POA: Diagnosis not present

## 2020-08-16 DIAGNOSIS — D6489 Other specified anemias: Secondary | ICD-10-CM | POA: Diagnosis present

## 2020-08-16 DIAGNOSIS — R739 Hyperglycemia, unspecified: Secondary | ICD-10-CM | POA: Diagnosis not present

## 2020-08-16 DIAGNOSIS — L8990 Pressure ulcer of unspecified site, unspecified stage: Secondary | ICD-10-CM | POA: Insufficient documentation

## 2020-08-16 DIAGNOSIS — Z452 Encounter for adjustment and management of vascular access device: Secondary | ICD-10-CM | POA: Diagnosis not present

## 2020-08-16 DIAGNOSIS — J9690 Respiratory failure, unspecified, unspecified whether with hypoxia or hypercapnia: Secondary | ICD-10-CM | POA: Diagnosis not present

## 2020-08-16 DIAGNOSIS — N183 Chronic kidney disease, stage 3 unspecified: Secondary | ICD-10-CM | POA: Diagnosis not present

## 2020-08-16 DIAGNOSIS — R1111 Vomiting without nausea: Secondary | ICD-10-CM | POA: Diagnosis not present

## 2020-08-16 DIAGNOSIS — R52 Pain, unspecified: Secondary | ICD-10-CM | POA: Diagnosis not present

## 2020-08-16 DIAGNOSIS — R197 Diarrhea, unspecified: Secondary | ICD-10-CM | POA: Diagnosis not present

## 2020-08-16 DIAGNOSIS — K565 Intestinal adhesions [bands], unspecified as to partial versus complete obstruction: Secondary | ICD-10-CM | POA: Diagnosis not present

## 2020-08-16 DIAGNOSIS — N950 Postmenopausal bleeding: Secondary | ICD-10-CM | POA: Diagnosis present

## 2020-08-16 DIAGNOSIS — N3289 Other specified disorders of bladder: Secondary | ICD-10-CM | POA: Diagnosis not present

## 2020-08-16 DIAGNOSIS — R933 Abnormal findings on diagnostic imaging of other parts of digestive tract: Secondary | ICD-10-CM | POA: Diagnosis not present

## 2020-08-16 DIAGNOSIS — E161 Other hypoglycemia: Secondary | ICD-10-CM | POA: Diagnosis not present

## 2020-08-16 DIAGNOSIS — Z978 Presence of other specified devices: Secondary | ICD-10-CM

## 2020-08-16 DIAGNOSIS — K432 Incisional hernia without obstruction or gangrene: Secondary | ICD-10-CM | POA: Diagnosis not present

## 2020-08-16 LAB — CBC WITH DIFFERENTIAL/PLATELET
Abs Immature Granulocytes: 0.21 10*3/uL — ABNORMAL HIGH (ref 0.00–0.07)
Basophils Absolute: 0 10*3/uL (ref 0.0–0.1)
Basophils Relative: 0 %
Eosinophils Absolute: 0.2 10*3/uL (ref 0.0–0.5)
Eosinophils Relative: 2 %
HCT: 25.4 % — ABNORMAL LOW (ref 36.0–46.0)
Hemoglobin: 8.3 g/dL — ABNORMAL LOW (ref 12.0–15.0)
Immature Granulocytes: 1 %
Lymphocytes Relative: 9 %
Lymphs Abs: 1.3 10*3/uL (ref 0.7–4.0)
MCH: 28.6 pg (ref 26.0–34.0)
MCHC: 32.7 g/dL (ref 30.0–36.0)
MCV: 87.6 fL (ref 80.0–100.0)
Monocytes Absolute: 0.9 10*3/uL (ref 0.1–1.0)
Monocytes Relative: 6 %
Neutro Abs: 12.4 10*3/uL — ABNORMAL HIGH (ref 1.7–7.7)
Neutrophils Relative %: 82 %
Platelets: 258 10*3/uL (ref 150–400)
RBC: 2.9 MIL/uL — ABNORMAL LOW (ref 3.87–5.11)
RDW: 23.3 % — ABNORMAL HIGH (ref 11.5–15.5)
WBC: 15.1 10*3/uL — ABNORMAL HIGH (ref 4.0–10.5)
nRBC: 0.3 % — ABNORMAL HIGH (ref 0.0–0.2)

## 2020-08-16 LAB — URINALYSIS, ROUTINE W REFLEX MICROSCOPIC
Bilirubin Urine: NEGATIVE
Glucose, UA: NEGATIVE mg/dL
Ketones, ur: NEGATIVE mg/dL
Leukocytes,Ua: NEGATIVE
Nitrite: NEGATIVE
Protein, ur: 30 mg/dL — AB
Specific Gravity, Urine: 1.006 (ref 1.005–1.030)
pH: 5 (ref 5.0–8.0)

## 2020-08-16 LAB — CK: Total CK: 1266 U/L — ABNORMAL HIGH (ref 38–234)

## 2020-08-16 LAB — COMPREHENSIVE METABOLIC PANEL
ALT: 18 U/L (ref 0–44)
AST: 34 U/L (ref 15–41)
Albumin: 2.4 g/dL — ABNORMAL LOW (ref 3.5–5.0)
Alkaline Phosphatase: 104 U/L (ref 38–126)
Anion gap: 16 — ABNORMAL HIGH (ref 5–15)
BUN: 73 mg/dL — ABNORMAL HIGH (ref 6–20)
CO2: 9 mmol/L — ABNORMAL LOW (ref 22–32)
Calcium: 5.5 mg/dL — CL (ref 8.9–10.3)
Chloride: 111 mmol/L (ref 98–111)
Creatinine, Ser: 10.25 mg/dL — ABNORMAL HIGH (ref 0.44–1.00)
GFR calc Af Amer: 4 mL/min — ABNORMAL LOW (ref >60)
GFR calc non Af Amer: 4 mL/min — ABNORMAL LOW (ref >60)
Glucose, Bld: 75 mg/dL (ref 70–99)
Potassium: <2 mmol/L — CL (ref 3.5–5.1)
Sodium: 136 mmol/L (ref 135–145)
Total Bilirubin: 0.9 mg/dL (ref 0.3–1.2)
Total Protein: 6.4 g/dL — ABNORMAL LOW (ref 6.5–8.1)

## 2020-08-16 LAB — NA AND K (SODIUM & POTASSIUM), RAND UR
Potassium Urine: 6 mmol/L
Sodium, Ur: 31 mmol/L

## 2020-08-16 LAB — LACTIC ACID, PLASMA: Lactic Acid, Venous: 1.3 mmol/L (ref 0.5–1.9)

## 2020-08-16 LAB — LIPASE, BLOOD: Lipase: 272 U/L — ABNORMAL HIGH (ref 11–51)

## 2020-08-16 LAB — CBG MONITORING, ED: Glucose-Capillary: 111 mg/dL — ABNORMAL HIGH (ref 70–99)

## 2020-08-16 MED ORDER — POTASSIUM CHLORIDE 10 MEQ/100ML IV SOLN
10.0000 meq | INTRAVENOUS | Status: AC
  Administered 2020-08-16: 10 meq via INTRAVENOUS
  Filled 2020-08-16 (×2): qty 100

## 2020-08-16 MED ORDER — SODIUM CHLORIDE 0.9 % IV SOLN: INTRAVENOUS | Status: DC

## 2020-08-16 MED ORDER — SODIUM CHLORIDE 0.9% FLUSH
3.0000 mL | Freq: Two times a day (BID) | INTRAVENOUS | Status: DC
  Administered 2020-08-16 – 2020-09-14 (×15): 3 mL via INTRAVENOUS

## 2020-08-16 MED ORDER — POTASSIUM CHLORIDE 10 MEQ/100ML IV SOLN
10.0000 meq | INTRAVENOUS | Status: DC
  Administered 2020-08-16: 10 meq via INTRAVENOUS
  Filled 2020-08-16: qty 100

## 2020-08-16 MED ORDER — HEPARIN SODIUM (PORCINE) 5000 UNIT/ML IJ SOLN
5000.0000 [IU] | Freq: Three times a day (TID) | INTRAMUSCULAR | Status: DC
  Administered 2020-08-17 (×2): 5000 [IU] via SUBCUTANEOUS
  Filled 2020-08-16 (×2): qty 1

## 2020-08-16 MED ORDER — MAGNESIUM SULFATE 2 GM/50ML IV SOLN
2.0000 g | INTRAVENOUS | Status: AC
  Administered 2020-08-16: 2 g via INTRAVENOUS
  Filled 2020-08-16: qty 50

## 2020-08-16 MED ORDER — CALCIUM GLUCONATE-NACL 1-0.675 GM/50ML-% IV SOLN
1.0000 g | Freq: Once | INTRAVENOUS | Status: AC
  Administered 2020-08-16: 1000 mg via INTRAVENOUS
  Filled 2020-08-16: qty 50

## 2020-08-16 MED ORDER — SODIUM CHLORIDE 0.9 % IV BOLUS: 2000.0000 mL | Freq: Once | INTRAVENOUS | Status: DC

## 2020-08-16 MED ORDER — POTASSIUM CHLORIDE 10 MEQ/100ML IV SOLN
10.0000 meq | Freq: Once | INTRAVENOUS | Status: AC
  Administered 2020-08-16: 10 meq via INTRAVENOUS
  Filled 2020-08-16: qty 100

## 2020-08-16 NOTE — ED Triage Notes (Signed)
Pt BIB [facility] for eval of weakness and hypoglycemia. Pt recently had ALL teeth extracted 3 days PTA. Pt reports dentist told her if she "couldn't get up in 3 days call 911". Pt w/ hx of CVA affecting R side, R side paralysis, R side facial droop, slurred speech, wheelchair bound at baseline. Pt reports weakness, inability to get to wheelchair like normally able, hypoglycemic to 66 for EMS on their arrival, no hx of DM. EMS admin 12.5 mg of D10 w/ improvement of CBG to 111 on their arrival. Pt had a CVA in 2017, has been off all meds since the end of last year d/t a "move and running out"

## 2020-08-16 NOTE — ED Notes (Signed)
This NT bladder scanned pt per orders. First scan showed 38mL and the second scan showed 78mL.

## 2020-08-16 NOTE — ED Provider Notes (Signed)
[facility] EMERGENCY DEPARTMENT Provider Note   CSN: [id number] Arrival date & time: 08/24/2020  1227     History Chief Complaint  Patient presents with  . Hypoglycemia  . Weakness   This patient is a 59 year old female, she is chronically ill with a significant stroke which left her essentially immobile.  She currently lives with her daughter here in [place].  She used to live in [place] where she got her care until the end of last year but now lives here.  She does not have a family doctor, she does not take any daily medications, she is supposed to but ran out and has not established care here.  She denies the use of alcohol or tobacco or other drugs.  The patient arrives by ambulance transport after the call went out for generalized weakness.  According to the patient she has not had much to eat except for chicken broth for the last couple of weeks and then since having all of her teeth pulled 3 days ago at the [facility] office she has had essentially nothing to eat or drink.  She continues to be weak, she can no longer get out of bed with assistance and is totally immobile in the bed.  She reports that she cannot lift either of her leg secondary to weakness.  There has been no vomiting however she does state that she has had diarrhea "as long as I can remember" however when trying to pin down the timing she states it has been about 1 week give or take a couple of days.  She is not a great historian and she has some difficulties with her speech secondary to the prior stroke.  The patient denies chest pain or shortness of breath, she denies swelling of the legs, she denies difficulty with urination other than the fact that she cannot get out of bed to urinate.  The paramedics reported that the patient was hypoglycemic with a number of 66, they give some D10, she was not altered at all, her vital signs have been normal without tachycardia or  hypotension.  My review of the prior medical record shows that she was on amlodipine in the past with prior ER visits, she states she is not on that at this time nor does she take any anticoagulants.  After the dental extraction the patient was placed on amoxicillin which she has taken for the last 3 days.  Kristin Barcus is a 59 y.o. female.   Hypoglycemia Associated symptoms: weakness   Weakness      Past Medical History:  Diagnosis Date  . Hypertension   . Stroke [facility])     There are no problems to display for this patient.   Past Surgical History:  Procedure Laterality Date  . CHOLECYSTECTOMY       OB History   No obstetric history on file.     History reviewed. No pertinent family history.  Social History   Tobacco Use  . Smoking status: Never Smoker  . Smokeless tobacco: Never Used  Substance Use Topics  . Alcohol use: Not Currently  . Drug use: Never    Home Medications Prior to Admission medications   Medication Sig Start Date End Date Taking? Authorizing Provider  acetaminophen (TYLENOL) 500 MG tablet Take 500 mg by mouth every 6 (six) hours as needed for moderate pain.    [provider]  amLODipine (NORVASC) 5 MG tablet Take 1 tablet (5 mg total) by  mouth daily. Patient not taking: Reported on 05/27/2020 06/14/18   Horton, Barbette Hair, MD  amoxicillin (AMOXIL) 500 MG capsule Take 500 mg by mouth 3 (three) times daily. 08/12/20   [provider]  HYDROcodone-acetaminophen (NORCO) 10-325 MG tablet Take 1 tablet by mouth every 6 (six) hours as needed for pain. 08/12/20   [provider]    Allergies    Patient has no known allergies.  Review of Systems   Review of Systems  Neurological: Positive for weakness.  All other systems reviewed and are negative.   Physical Exam Updated Vital Signs BP 112/77 (BP Location: Right Arm)   Pulse 97   Temp 98.2 F (36.8 C) (Oral)   Resp 13   Ht 1.689 m (5' 6.5")   Wt (!) 177 kg    SpO2 100%   BMI 62.04 kg/m   Physical Exam Vitals and nursing note reviewed.  Constitutional:      General: She is not in acute distress.    Appearance: She is well-developed.  HENT:     Head: Normocephalic and atraumatic.     Mouth/Throat:     Pharynx: No oropharyngeal exudate.     Comments: There are no teeth left, there are multiple sutures in the mouth, there is no bleeding or swelling. Eyes:     General: No scleral icterus.       Right eye: No discharge.        Left eye: No discharge.     Conjunctiva/sclera: Conjunctivae normal.     Pupils: Pupils are equal, round, and reactive to light.  Neck:     Thyroid: No thyromegaly.     Vascular: No JVD.  Cardiovascular:     Rate and Rhythm: Normal rate and regular rhythm.     Heart sounds: Normal heart sounds. No murmur heard.  No friction rub. No gallop.   Pulmonary:     Effort: Pulmonary effort is normal. No respiratory distress.     Breath sounds: Normal breath sounds. No wheezing or rales.  Abdominal:     General: Bowel sounds are normal. There is no distension.     Palpations: Abdomen is soft. There is no mass.     Tenderness: There is abdominal tenderness.     Comments: Mild diffuse abdominal tenderness without guarding or peritoneal signs, nonfocal  Musculoskeletal:        General: No tenderness. Normal range of motion.     Cervical back: Normal range of motion and neck supple.  Lymphadenopathy:     Cervical: No cervical adenopathy.  Skin:    General: Skin is warm and dry.     Findings: No erythema or rash.  Neurological:     Mental Status: She is alert.     Coordination: Coordination normal.     Comments: Slight facial droop, right arm weakness with contractures, able to use the left arm to grip well.  Able to move both legs but very very weak bilaterally.  Seems to be a little bit asymmetrical right greater than left weakness however is able to move both legs.  Psychiatric:        Behavior: Behavior normal.      ED Results / Procedures / Treatments   Labs (all labs ordered are listed, but only abnormal results are displayed) Labs Reviewed  CBC WITH DIFFERENTIAL/PLATELET - Abnormal; Notable for the following components:      Result Value   WBC 15.1 (*)    RBC 2.90 (*)  Hemoglobin 8.3 (*)    HCT 25.4 (*)    RDW 23.3 (*)    nRBC 0.3 (*)    Neutro Abs 12.4 (*)    Abs Immature Granulocytes 0.21 (*)    All other components within normal limits  URINALYSIS, ROUTINE W REFLEX MICROSCOPIC - Abnormal; Notable for the following components:   APPearance HAZY (*)    Hgb urine dipstick LARGE (*)    Protein, ur 30 (*)    Bacteria, UA FEW (*)    All other components within normal limits  COMPREHENSIVE METABOLIC PANEL - Abnormal; Notable for the following components:   Potassium <2.0 (*)    CO2 9 (*)    BUN 73 (*)    Creatinine, Ser 10.25 (*)    Calcium 5.5 (*)    Total Protein 6.4 (*)    Albumin 2.4 (*)    GFR calc non Af Amer 4 (*)    GFR calc Af Amer 4 (*)    Anion gap 16 (*)    All other components within normal limits  LIPASE, BLOOD - Abnormal; Notable for the following components:   Lipase 272 (*)    All other components within normal limits  CK - Abnormal; Notable for the following components:   Total CK 1,266 (*)    All other components within normal limits  CBG MONITORING, ED - Abnormal; Notable for the following components:   Glucose-Capillary 111 (*)    All other components within normal limits  C DIFFICILE QUICK SCREEN W PCR REFLEX  URINE CULTURE  LACTIC ACID, PLASMA  NA AND K (SODIUM & POTASSIUM), RAND UR  NA AND K (SODIUM & POTASSIUM), 24 H UR  CBG MONITORING, ED    EKG EKG Interpretation  Date/Time:  Saturday August 16 2020 12:39:35 EDT Ventricular Rate:  94 PR Interval:    QRS Duration: 105 QT Interval:  455 QTC Calculation: 570 R Axis:   40 Text Interpretation: Normal sinus rhythm Abnormal T, consider ischemia, diffuse leads Prolonged QT interval  Since last tracing Nonspecific T wave abnormality now present. Confirmed by Noemi Chapel [id number]) on 09/05/2020 12:44:44 PM   Radiology DG Chest Port 1 View  Result Date: 09/05/2020 CLINICAL DATA:  Weakness. EXAM: PORTABLE CHEST 1 VIEW COMPARISON:  June 14, 2018. FINDINGS: The heart size and mediastinal contours are within normal limits. Both lungs are clear. The visualized skeletal structures are unremarkable. IMPRESSION: No active disease. Electronically Signed   By: Marijo Conception M.D.   On: 09/10/2020 13:42    Procedures .Critical Care Performed by: Noemi Chapel, MD Authorized by: Noemi Chapel, MD   Critical care provider statement:    Critical care time (minutes):  75   Critical care time was exclusive of:  Separately billable procedures and treating other patients and teaching time   Critical care was necessary to treat or prevent imminent or life-threatening deterioration of the following conditions:  Renal failure and endocrine crisis   Critical care was time spent personally by me on the following activities:  Blood draw for specimens, development of treatment plan with patient or surrogate, discussions with consultants, evaluation of patient's response to treatment, examination of patient, obtaining history from patient or surrogate, ordering and performing treatments and interventions, ordering and review of laboratory studies, ordering and review of radiographic studies, pulse oximetry, re-evaluation of patient's condition and review of old charts   (including critical care time)  Medications Ordered in ED Medications  0.9 %  sodium chloride infusion (  Intravenous New Bag/Given 09/10/2020 1706)  magnesium sulfate IVPB 2 g 50 mL (has no administration in time range)  potassium chloride 10 mEq in 100 mL IVPB (10 mEq Intravenous New Bag/Given 08/19/2020 1715)  calcium gluconate 1 g/ 50 mL sodium chloride IVPB (has no administration in time range)    ED Course  I have reviewed the  triage vital signs and the nursing notes.  Pertinent labs & imaging results that were available during my care of the patient were reviewed by me and considered in my medical decision making (see chart for details).    MDM Rules/Calculators/A&P                          The patient appears dehydrated, she may have electrolyte abnormalities or kidney injury given to dehydration.  Will need to evaluate with labs, urinalysis, send for C. difficile given that she is on antibiotics with the diarrhea and has abdominal pain.  She was hypoglycemic to 66 but not altered.  This is likely related to intake.  Resuscitate with fluids  This patient has multiple critical labs, potassium is undetectably low at less than 2, calcium is critically low at 5.5, creatinine is over 10 and the BUN is 73.  As an aside the patient does have a lipase of 272 and a creatine kinase of 1266.  I doubt that the creatinine kinase is called rhabdomyolysis that would account for a creatinine of 10.  The patient is getting IV fluids, she will need potassium, magnesium, she will be placed on a cardiac monitor since the EKG does show a prolonged QT.  She will need to be admitted to the hospital.  She is critically ill  Prior labs show creatinine of 1.7, calcium of 8.4 and a BUN of 13, that was in 2019 when she last had labs drawn.  Consult with nephrology, consult with inpatient medical team, IV fluids started, CT scan pending however urinary bladder was emptied on in and out catheterization.  Doubt this is urinary obstructive.  D/w Dr. Jonnie Finner - requests renal US and urine lytes - admit to hospitalist - he will consult in person.  Discussed with inpatient internal medicine resident who will admit the patient to the hospital.  Final Clinical Impression(s) / ED Diagnoses Final diagnoses:  Acute renal failure, unspecified acute renal failure type ([place])  Hypokalemia  Hypocalcemia  Anemia, unspecified type      Noemi Chapel,  MD 09/03/2020 1759

## 2020-08-16 NOTE — ED Notes (Signed)
Attempted Report-5MW Refused

## 2020-08-16 NOTE — H&P (Addendum)
Date: 09/07/2020               Patient Name:  Cristina Jordan MRN: [id number]  DOB: 1961/06/01 Age / Sex: 59 y.o., female   PCP: Patient, No Pcp Per         Medical Service: Internal Medicine Teaching Service         Attending Physician: Dr. Sid Falcon, MD    First Contact: Dr. Konrad Penta Pager: [phone]  Second Contact: Dr. Myrtie Hawk  Pager: [phone]       After Hours (After 5p/  First Contact Pager: [phone]  weekends / holidays): Second Contact Pager: [phone]   Chief Complaint: Weakness, Poor PO intake  History of Present Illness:   Cristina Jordan is a morbidly obese 59 y.o. F w/ hx CVA in 2017 and HTN, presenting with 1 week of worsening weakness.  She states that she first developed nausea 2 weeks ago with vomiting, 2-3x per day since onset. Her vomit is yellow and acidic. She states since onset of N/V, she has been unable to eat anything and vomits after attempting to drink fluids. She also notes 1 week of intermittent, squeezing, abdominal pain that lasts 15-20 minutes at a time. Her pain is currently an 8/10. She states her pain comes on suddenly and doesn't seem to be particularly worse during certain times of day or after food, despite limited PO intake. She says she has also had diarrhea for the past week. She has had 2 to 3 loose stools per day for the past week, no watery bowel movements. She says that she uses a wheelchair to get around at baseline and says her daughter who lives with her at home is home "all the time" and helps her with her daily activities. She notes, however, that she has been too weak to get out of bed much over the past couple days and said her daughter has had to change her after her BM's. She states her daughter never mentioned any blood in her stools. The patient states she had all of her teeth removed by a surgeon 3 days ago but denies mouth pain. She endorses chills and sore throat but denies any SOB, light-headedness, dizziness, pre-syncope, syncope,  CP, swelling or any other symptoms at this time.   Home Meds: Amoxicillin 500mg  TID Patient is prescribed amlodipine 5mg  daily but says she has not been taking this.  Allergies: Allergies as of 09/03/2020  . (No Known Allergies)   Past Medical History:  Diagnosis Date  . Hypertension   . Stroke [facility])     Family History: Patient's father and grandfather had kidney disease.   Social History: Patient has history of cholelithiasis. Denies other surgeries other than recent oral surgery.  Review of Systems: All others negative except as noted in HPI.  Physical Exam: Blood pressure (!) 142/94, pulse 93, temperature 98.1 F (36.7 C), temperature source Oral, resp. rate 18, height 5' 6.5" (1.689 m), weight (!) 177 kg, SpO2 100 %.  Physical Exam Vitals and nursing note reviewed.  Constitutional:      General: She is not in acute distress.    Appearance: She is obese. She is ill-appearing.  HENT:     Head: Normocephalic and atraumatic.  Cardiovascular:     Rate and Rhythm: Normal rate and regular rhythm.     Heart sounds: No murmur heard.   Pulmonary:     Effort: No respiratory distress.  Abdominal:     General: There is no  distension.     Palpations: Abdomen is soft.  Musculoskeletal:     Right lower leg: No edema.     Left lower leg: No edema.  Skin:    General: Skin is warm and dry.  Neurological:     Mental Status: She is alert and oriented to person, place, and time.     Comments: Residual weakness of rt upper and lower extremity and slurred speech  Psychiatric:        Behavior: Behavior normal.   Normal range of affect.   EKG: personally reviewed my interpretation is QRS prolongation  CXR: personally reviewed my interpretation is no acute cardiopulmonary abnormality  Assessment & Plan by Problem: Active Problems:   Rhabdomyolysis   Hypokalemia   Hypocalcemia  59 y.o. female with past medical history of CVA with residual right-sided paralysis and recent  tooth extraction presenting with worsening of weakness, poor PO intake, N/V/D, and abdominal pain.  Rhabdomyolysis Sever AKI: Likely Prerenal (2/2 poor Po intake) + ATN (due to rhabdo) Patient is wheelchair-bound. She had worsening of weakness and poor PO intake recently. On arrival CK was elevated at 1266, creatinine was 10.2 (was 1.7 on 2019). She had hematuria on UA. LA 1.3. - Bladder scan did not show retension - Nephro consulted in ED and ordered US and CT scan as well as urinary Na and K. Will follow nephro rec. -Giving IV fluid -Strict I&O's -Avoid nephrotixics  Severe hypokalemia Potassium less than 2 on arrival, likely in setting of poor p.o. intake and diarrhea. Received 65mEq IV potassium in ED.  - Giving additional IV KCl 73mEq - Monitor CMP q6hr and replace as needed - Continuous cardiac monitoring  Hypocalcemia Calcium 5.5 (corrected ~6.4). Received calcium gluconate 1g in ED.  - Monitor morning CMP q6hr and continue to replace as needed   Hypoglycemia, Resolved CBG was 66 on EMS arrival. Improved with D10 infusion.  - Continue to monitor CBG  Prolonged QT -Cardiac monitoring -Replacing electrolyte -Avoiding QT prolonging meds.   Leukocytosis WBC 15.1, afebrile, without source of infection thus far. CXR without consolidation and U/A with only bacteriuria and epithelial cells. May be secondary to recent surgical procedure.  - Follow up urine culture and blood culture  Diarrhea Patient has been on amoxicillin, having 2 to 3 loose stools (not watery or bloody) for 1 week. VS stable.  - C diff pending  New generalized weakness with Chronic R-sided weakness R-sided weakness residual from previous stroke; however, generalized weakness new and worsening, likely in setting of poor PO intake, N/V/D.  - Will monitor for improvement with electrolyte replacement / fluids.  Addendum 08/17/20:  Code Status: Full code DVT PPx: Heparin 5000U SQ TID IVF: NS 200 mL/Hr    Dispo: Admit patient to Inpatient with expected length of stay greater than 2 midnights.  Signed: Jeralyn Bennett, MD 09/04/2020, 9:41 PM  Pager: [phone] After 5pm on weekdays and 1pm on weekends: On Call pager: [phone]

## 2020-08-16 NOTE — Progress Notes (Signed)
Attempted to get report from ED RN.  Secretary to have the RN to call me.  Earleen Reaper RN

## 2020-08-16 NOTE — Consult Note (Addendum)
Renal Service Consult Note Cristina Jordan [facility]  Cristina Jordan 09/11/2020 Sol Blazing Requesting Physician: Dr. Daryll Drown  Reason for Consult:  Renal failure HPI: The patient is a 59 y.o. year-old w/ hx of CVA and HTN, cared for by family at home, bed to [facility] bound. She lives w/ her dtr in [place], does not take any medications and does not have a family doctor since moving from [place] about a year ago. Pt presented for gen'd weakness, also nausea and vomiting. Pt has had much to eat except broth for the last 2 wks, also she had all of her teeth pulled 3 d ago, and has not been eating well since the surgery reportedly . She is very weak and cannot get OOB w/ assistance as previously. No vomiting, but does have diarrhea but this is chronic issue. Has R sided weakness and some aphasia as CVA complications but communicates very well. In the ED creat is 10.25, K < 2.0, Na 136  CO2 9  AG 16  Alb 2.4  tprot 6.4  AST /ALT okay, eGFR 4 ml/min, Hb 8  WBC 15k  plt 258  CPK 1266  UA 0-5 wbc/ rbc, large Hb, hazy, prot 30, few bact. UNa 31    ROS  denies CP  no joint pain   no HA  no blurry vision  no rash  no diarrhea  no nausea/ vomiting  no dysuria  no difficulty voiding  no change in urine color    Past Medical History  Past Medical History:  Diagnosis Date  . Hypertension   . Stroke [facility])    Past Surgical History  Past Surgical History:  Procedure Laterality Date  . CHOLECYSTECTOMY     Family History History reviewed. No pertinent family history. Social History  reports that she has never smoked. She has never used smokeless tobacco. She reports previous alcohol use. She reports that she does not use drugs. Allergies No Known Allergies Home medications Prior to Admission medications   Medication Sig Start Date End Date Taking? Authorizing Provider  amoxicillin (AMOXIL) 500 MG capsule Take 500 mg by mouth 3 (three) times daily. 08/12/20  Yes [provider]  HYDROcodone-acetaminophen (NORCO) 10-325 MG tablet Take 1 tablet by mouth every 6 (six) hours as needed (pain).  08/12/20  Yes [provider]  OVER THE COUNTER MEDICATION Take 2 tablets by mouth daily as needed (pain). "tylenol migraine"   Yes [provider]  amLODipine (NORVASC) 5 MG tablet Take 1 tablet (5 mg total) by mouth daily. Patient not taking: Reported on 05/27/2020 06/14/18   [name], MD     Vitals:   08/18/2020 1704 08/21/2020 1715 09/07/2020 1954 09/02/2020 2059  BP: 112/77 (!) 147/88 (!) 146/89 (!) 142/94  Pulse: 97 92 91 93  Resp: _0 Temp:    98.1 F (36.7 C)  TempSrc:    Oral  SpO2: 100% 100% 100%   Weight:      Height:       Exam Gen pleasant, aphasic AAF, lying flat, c/o back pain, no distress No rash, cyanosis or gangrene Sclera anicteric, throat clear, dry mouth  No jvd or bruits, flat neck veins  Chest clear bilat to bases no rales or wheezing RRR no MRG Abd soft ntnd no mass or ascites +bs obese GU defer MS no joint effusions or deformity Ext no LE or UE edema, no wounds or ulcers Neuro is alert, Ox 3 , nf,  no asterixis    Home meds:  - norvasc 5 qd/ amoxcillin 500 tid/ norco qid prn   Cr 10.25, K < 2.0, Na 136  CO2 9  AG 16  Alb 2.4  tprot 6.4  AST /ALT ok   eGFR 4 ml/min  Hb 8  WBC 15k  plt 258  CPK 1266  Ca++ 5.5, corr Ca++ 6.7 UA 0-5 wbc/ rbc, large Hb, hazy, prot 30, few bact UNa 31 CXR - No active disease. CT abd - No hydronephrosis or perinephric edema. Lobulated bilateral renal contours. Partially distended urinary bladder without wall thickening. US Renal - IMPRESSION: 1. Mildly increased echogenicity of both kidneys, compatible with medical renal disease. 8.3 and 8.9 cm long.  2. No hydronephrosis.   Last creat in Epic was July 2019 creat = 1.77, eGFR 36, nothing in CE  Assessment/ Plan: 1. Renal failure - b/l creat from 2019 was 1.77, eGFR 36.  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  Pt looks dry  and may be pre-renal.  Kidneys on US and CT look diseased/ on the small side, but no signs of obstruction.  Possibly rhabdo is playing a role but the level is not real high as would usually be expected to cause AKI.  She has room for volume, getting IVF's at 250 cc/hr now, will adjust and give 2L bolus then 150 cc/ hr IVF's overnight.  Foley cath, daily labs.  +N/V, poor appetite and gen'd weakness which may or may not be uremia related. Pt is alert and responsive.  Hopefully renal function can recover to some reasonable baseline.  Will follow. 2. Hypomag/ hypokalemia/ hypocalcemia - appreciate primary / pharm assistance w/ replacing these 3. H/o CVA - R hemiparesis and aphasia 4. Chronic debility - bed to WC-bound  5. Anemia - Hb 8.5, maybe d/t CKD in part      Rob Schertz  MD 08/31/2020, 9:18 PM  Recent Labs  Lab 08/24/2020 1253  WBC 15.1*  HGB 8.3*   Recent Labs  Lab 08/21/2020 1559  K <2.0*  BUN 73*  CREATININE 10.25*  CALCIUM 5.5*    

## 2020-08-17 ENCOUNTER — Other Ambulatory Visit: Payer: Self-pay

## 2020-08-17 ENCOUNTER — Encounter (HOSPITAL_COMMUNITY): Payer: Self-pay | Admitting: Internal Medicine

## 2020-08-17 ENCOUNTER — Inpatient Hospital Stay: Payer: Self-pay

## 2020-08-17 DIAGNOSIS — D72829 Elevated white blood cell count, unspecified: Secondary | ICD-10-CM

## 2020-08-17 DIAGNOSIS — N179 Acute kidney failure, unspecified: Secondary | ICD-10-CM

## 2020-08-17 DIAGNOSIS — I69361 Other paralytic syndrome following cerebral infarction affecting right dominant side: Secondary | ICD-10-CM

## 2020-08-17 LAB — CBC
HCT: 22.5 % — ABNORMAL LOW (ref 36.0–46.0)
HCT: 23.4 % — ABNORMAL LOW (ref 36.0–46.0)
Hemoglobin: 7.5 g/dL — ABNORMAL LOW (ref 12.0–15.0)
Hemoglobin: 7.6 g/dL — ABNORMAL LOW (ref 12.0–15.0)
MCH: 28.8 pg (ref 26.0–34.0)
MCH: 28.8 pg (ref 26.0–34.0)
MCHC: 33.3 g/dL (ref 30.0–36.0)
MCHC: 32.5 g/dL (ref 30.0–36.0)
MCV: 86.5 fL (ref 80.0–100.0)
MCV: 88.6 fL (ref 80.0–100.0)
Platelets: 248 10*3/uL (ref 150–400)
Platelets: 255 10*3/uL (ref 150–400)
RBC: 2.6 MIL/uL — ABNORMAL LOW (ref 3.87–5.11)
RBC: 2.64 MIL/uL — ABNORMAL LOW (ref 3.87–5.11)
RDW: 22.9 % — ABNORMAL HIGH (ref 11.5–15.5)
RDW: 23.5 % — ABNORMAL HIGH (ref 11.5–15.5)
WBC: 13.1 10*3/uL — ABNORMAL HIGH (ref 4.0–10.5)
WBC: 13.9 10*3/uL — ABNORMAL HIGH (ref 4.0–10.5)
nRBC: 0.4 % — ABNORMAL HIGH (ref 0.0–0.2)
nRBC: 0.4 % — ABNORMAL HIGH (ref 0.0–0.2)

## 2020-08-17 LAB — MAGNESIUM: Magnesium: 1.4 mg/dL — ABNORMAL LOW (ref 1.7–2.4)

## 2020-08-17 LAB — C DIFFICILE QUICK SCREEN W PCR REFLEX
C Diff antigen: NEGATIVE
C Diff interpretation: NOT DETECTED
C Diff toxin: NEGATIVE

## 2020-08-17 LAB — URINE CULTURE: Culture: NO GROWTH

## 2020-08-17 LAB — HEMOGLOBIN A1C
Hgb A1c MFr Bld: 4.6 % — ABNORMAL LOW (ref 4.8–5.6)
Mean Plasma Glucose: 85.32 mg/dL

## 2020-08-17 LAB — RENAL FUNCTION PANEL
Albumin: 2.1 g/dL — ABNORMAL LOW (ref 3.5–5.0)
BUN: 72 mg/dL — ABNORMAL HIGH (ref 6–20)
CO2: <7 mmol/L — ABNORMAL LOW (ref 22–32)
Calcium: 5.7 mg/dL — CL (ref 8.9–10.3)
Chloride: 115 mmol/L — ABNORMAL HIGH (ref 98–111)
Creatinine, Ser: 9.9 mg/dL — ABNORMAL HIGH (ref 0.44–1.00)
GFR calc Af Amer: 5 mL/min — ABNORMAL LOW (ref >60)
GFR calc non Af Amer: 4 mL/min — ABNORMAL LOW (ref >60)
Glucose, Bld: 65 mg/dL — ABNORMAL LOW (ref 70–99)
Phosphorus: 4 mg/dL (ref 2.5–4.6)
Potassium: 2.5 mmol/L — CL (ref 3.5–5.1)
Sodium: 138 mmol/L (ref 135–145)

## 2020-08-17 LAB — COMPREHENSIVE METABOLIC PANEL
ALT: 17 U/L (ref 0–44)
AST: 27 U/L (ref 15–41)
Albumin: 2.2 g/dL — ABNORMAL LOW (ref 3.5–5.0)
Alkaline Phosphatase: 97 U/L (ref 38–126)
BUN: 71 mg/dL — ABNORMAL HIGH (ref 6–20)
CO2: <7 mmol/L — ABNORMAL LOW (ref 22–32)
Calcium: 5.5 mg/dL — CL (ref 8.9–10.3)
Chloride: 111 mmol/L (ref 98–111)
Creatinine, Ser: 9.62 mg/dL — ABNORMAL HIGH (ref 0.44–1.00)
GFR calc Af Amer: 5 mL/min — ABNORMAL LOW (ref >60)
GFR calc non Af Amer: 4 mL/min — ABNORMAL LOW (ref >60)
Glucose, Bld: 66 mg/dL — ABNORMAL LOW (ref 70–99)
Potassium: <2 mmol/L — CL (ref 3.5–5.1)
Sodium: 135 mmol/L (ref 135–145)
Total Bilirubin: 0.9 mg/dL (ref 0.3–1.2)
Total Protein: 5.7 g/dL — ABNORMAL LOW (ref 6.5–8.1)

## 2020-08-17 LAB — SARS CORONAVIRUS 2 (TAT 6-24 HRS): SARS Coronavirus 2: NEGATIVE

## 2020-08-17 LAB — IRON AND TIBC
Iron: 64 ug/dL (ref 28–170)
Saturation Ratios: 30 % (ref 10.4–31.8)
TIBC: 214 ug/dL — ABNORMAL LOW (ref 250–450)
UIBC: 150 ug/dL

## 2020-08-17 LAB — CREATININE, URINE, RANDOM: Creatinine, Urine: 56.46 mg/dL

## 2020-08-17 LAB — LIPASE, BLOOD: Lipase: 263 U/L — ABNORMAL HIGH (ref 11–51)

## 2020-08-17 LAB — HIV ANTIBODY (ROUTINE TESTING W REFLEX): HIV Screen 4th Generation wRfx: NONREACTIVE

## 2020-08-17 LAB — FERRITIN: Ferritin: 86 ng/mL (ref 11–307)

## 2020-08-17 MED ORDER — POTASSIUM CHLORIDE 10 MEQ/100ML IV SOLN
10.0000 meq | INTRAVENOUS | Status: AC
  Administered 2020-08-17 (×2): 10 meq via INTRAVENOUS
  Filled 2020-08-17: qty 100

## 2020-08-17 MED ORDER — SCOPOLAMINE 1 MG/3DAYS TD PT72
1.0000 | MEDICATED_PATCH | TRANSDERMAL | Status: DC
  Administered 2020-08-17 – 2020-09-10 (×9): 1.5 mg via TRANSDERMAL
  Filled 2020-08-17 (×9): qty 1

## 2020-08-17 MED ORDER — CALCIUM CARBONATE ANTACID 500 MG PO CHEW
400.0000 mg | CHEWABLE_TABLET | Freq: Two times a day (BID) | ORAL | Status: DC
  Administered 2020-08-18 – 2020-08-24 (×5): 400 mg via ORAL
  Filled 2020-08-17 (×12): qty 2

## 2020-08-17 MED ORDER — POTASSIUM CHLORIDE CRYS ER 20 MEQ PO TBCR
30.0000 meq | EXTENDED_RELEASE_TABLET | Freq: Four times a day (QID) | ORAL | Status: AC
  Administered 2020-08-17 – 2020-08-18 (×2): 30 meq via ORAL
  Filled 2020-08-17 (×2): qty 1

## 2020-08-17 MED ORDER — SODIUM BICARBONATE 8.4 % IV SOLN
INTRAVENOUS | Status: DC
  Filled 2020-08-17: qty 100

## 2020-08-17 MED ORDER — POTASSIUM CHLORIDE CRYS ER 20 MEQ PO TBCR
40.0000 meq | EXTENDED_RELEASE_TABLET | Freq: Once | ORAL | Status: AC
  Administered 2020-08-17: 40 meq via ORAL
  Filled 2020-08-17: qty 2

## 2020-08-17 MED ORDER — POTASSIUM CHLORIDE 10 MEQ/100ML IV SOLN: 10.0000 meq | INTRAVENOUS | Status: DC

## 2020-08-17 MED ORDER — SODIUM BICARBONATE 8.4 % IV SOLN
INTRAVENOUS | Status: DC
  Filled 2020-08-17 (×23): qty 100

## 2020-08-17 MED ORDER — CALCIUM GLUCONATE-NACL 1-0.675 GM/50ML-% IV SOLN
1.0000 g | Freq: Once | INTRAVENOUS | Status: AC
  Administered 2020-08-17: 1000 mg via INTRAVENOUS
  Filled 2020-08-17: qty 50

## 2020-08-17 MED ORDER — CALCIUM GLUCONATE-NACL 2-0.675 GM/100ML-% IV SOLN
2.0000 g | Freq: Once | INTRAVENOUS | Status: AC
  Administered 2020-08-17: 2000 mg via INTRAVENOUS
  Filled 2020-08-17: qty 100

## 2020-08-17 MED ORDER — MAGNESIUM SULFATE 2 GM/50ML IV SOLN
2.0000 g | INTRAVENOUS | Status: AC
  Administered 2020-08-17: 2 g via INTRAVENOUS
  Filled 2020-08-17: qty 50

## 2020-08-17 MED ORDER — MAGNESIUM SULFATE 2 GM/50ML IV SOLN
2.0000 g | Freq: Once | INTRAVENOUS | Status: AC
  Administered 2020-08-17: 2 g via INTRAVENOUS
  Filled 2020-08-17: qty 50

## 2020-08-17 MED ORDER — ACETAMINOPHEN 325 MG PO TABS
650.0000 mg | ORAL_TABLET | Freq: Four times a day (QID) | ORAL | Status: DC | PRN
  Administered 2020-08-18 – 2020-08-26 (×3): 650 mg via ORAL
  Filled 2020-08-17 (×5): qty 2

## 2020-08-17 MED ORDER — CHLORHEXIDINE GLUCONATE CLOTH 2 % EX PADS
6.0000 | MEDICATED_PAD | Freq: Every day | CUTANEOUS | Status: DC
  Administered 2020-08-17 – 2020-09-16 (×22): 6 via TOPICAL

## 2020-08-17 MED ORDER — METRONIDAZOLE IN NACL 5-0.79 MG/ML-% IV SOLN
500.0000 mg | Freq: Three times a day (TID) | INTRAVENOUS | Status: DC
  Administered 2020-08-17 – 2020-08-19 (×6): 500 mg via INTRAVENOUS
  Filled 2020-08-17 (×6): qty 100

## 2020-08-17 NOTE — Progress Notes (Addendum)
Able to gain access of basilic and brachial veins with great blood return.  Unable to thread guidewire beyond shoulder area.   Cephalic vein too small for access.  Recommend CVC or IR to place central access.  RN aware of attempts.  Secure chat sent to Dr Luvenia Redden and Dr Jonnie Finner.

## 2020-08-17 NOTE — Progress Notes (Signed)
Telephone consent obtained from daughter, Jazzlynn Rawe for PICC placement.

## 2020-08-17 NOTE — Progress Notes (Addendum)
   Subjective: Patient was seen and evaluated at bedside on morning rounds. There was some IV access issue overnight and some delay regarding administration of medication. She had some vaginal bleeding this morning. Her appetite improved but still reports some nausea and diarrhea abdominal pain.  Objective:  Vital signs in last 24 hours: Vitals:   09/06/2020 2059 08/17/20 0259 08/17/20 0609 08/17/20 1634  BP: (!) 142/94 129/75 133/69 128/73  Pulse: 93 92 91 92  Resp: 18 18 17 18   Temp: 98.1 F (36.7 C) (!) 97.5 F (36.4 C) 97.6 F (36.4 C) 97.8 F (36.6 C)  TempSrc: Oral Oral Oral Oral  SpO2:  100% 100% 100%  Weight: 133 kg     Height:       Constitutional: Obese lady, in no acute distress.  Cardiovascular: RRR, nl S1S2, no murmur, no LEE Respiratory: Effort normal and breath sounds normal. No respiratory distress. No wheezes.  GI: Soft. Bowel sounds are normal. No distension.  Mild tenderness at LUQ without guarding or rebound tenderness. Neurological: Is alert.  Residual right side paralysis (at baseline was ( Skin: Not diaphoretic. No erythema.   Assessment/Plan:  Active Problems:   Rhabdomyolysis   Hypokalemia   Hypocalcemia   Acute renal failure ([place])   59 year old female with history of CVA and residual right side paralysis, wheelchair-bound, presented with nausea vomiting, abdominal pain, diarrhea, weakness and found to have severe AKI, severe electrolyte abnormalities including hypokalemia, hypocalcemia, hypomagnesemia.  Colitis:  Patient presented with abdominal pain and nonwatery diarrhea. Had leukocytosis of 15.  Blood culture negative so far. CT abdomen pelvis without contrast was suggestive of diffuse colonic wall thickening with pericolonic edema involving the descending and sigmoid colon suspicious for colitis. This may be infectious or inflammatory.  -C. difficile negative. -GI panel -IV fluid -Started metronidazole -Bowel rest except  medications -Scopolamine patch for nausea  Electrolyte abnormalities:  Hypokalemia K<2 on arrival Hypocalcemia  Hypomagnesemia Hemodynamically stable.  EKG with prolonged QT, no arrhythmia. Due to to poor p.o. intake and diarrhea/colitis  -Unfortunately patient has had IV access issue -P.o. potassium supplement -Replace mag and calcium with IV supplement -IR plan to place a central line for access -Monitoring electrolytes: Follow-up BMP at 4 PM and replace as needed -Avoid QT prolonging medications   Rhabdomyolysis: CK~200.  UA consistent with myoglobinuria Severe AKI: Creatinine on arrival was 10.2 (was 1.7 on 2019) prerenal 2/2 poor p.o. intake and diarrhea + 2/2 rhabdomyolysis.  Ultrasound showed mildly increased echogenicity of both kidneys, compatible with medical renal disease.  -Continue IV fluid (currently on 200 mL/h normal saline) -Nephrology is following.  Appreciate recommendation -Foley catheter is in place -Strict in and out -Avoid nephrotoxic  Normocytic anemia: Hemoglobin today decreased to 7.5 (  Can be dilutional) from 8.3 yesterday. Vaginal bleeding  -Iron panel and ferritin -Repeat CBC this evening and transfuse for goal of HB>7 -DC subcutaneous heparin -CBC daily -Will need more OBGYN evaluation regarding GI bleeding when clinically improves  Prior to Admission Living Arrangement: Home Anticipated Discharge Location: To be determined Barriers to Discharge: Medical treatment Dispo: Anticipated discharge in approximately 4-5 daysday(s).   [name], MD 08/17/2020, 6:07 PM Pager: [phone] After 5pm on weekdays and 1pm on weekends: On Call pager [phone]

## 2020-08-17 NOTE — Plan of Care (Signed)
  Problem: Education: Goal: Knowledge of General Education information will improve Description Including pain rating scale, medication(s)/side effects and non-pharmacologic comfort measures Outcome: Progressing   

## 2020-08-17 NOTE — Progress Notes (Signed)
Spoke with MD Schertz via secure chat about the placement of a midline for this patient. The patient's veins were to deep for me to access and the patient is unable to extended her arm up to 90 degrees. Spoke with MD about possibly using the left arm and he prefers the right arm to be used for access. At this time, the recommendation would be a central line.

## 2020-08-17 NOTE — Progress Notes (Signed)
Rectal tube was placed. Patient has bleeding from her vagina . MD was notified.

## 2020-08-17 NOTE — Progress Notes (Addendum)
[facility] Progress Note  Subjective: good UOP 850 cc, creat 9.9, pt was same c/o's as yesterday, aches and pains mostly no sob   Vitals:   09/11/2020 2059 08/17/20 0259 08/17/20 0609 08/17/20 1634  BP: (!) 142/94 129/75 133/69 128/73  Pulse: 93 92 91 92  Resp: _0 Temp: 98.1 F (36.7 C) (!) 97.5 F (36.4 C) 97.6 F (36.4 C) 97.8 F (36.6 C)  TempSrc: Oral Oral Oral Oral  SpO2:  100% 100% 100%  Weight: 133 kg     Height:        Exam: Gen pleasant, very obese, aphasic AAF, lying flat no distress No jvd or bruits, flat neck veins  Chest clear bilat to bases no rales or wheezing RRR no MRG Abd soft ntnd no mass or ascites +bs obese GU foley in place Ext no LE or UE edema Neuro is alert , nf, no asterixis, sig debilitated/ weakened    Home meds:  - norvasc 5 qd/ amoxcillin 500 tid/ norco qid prn   UA 0-5 wbc/ rbc, large Hb, hazy, prot 30, few bact UNa 31 CXR - No active disease. CT abd - No hydronephrosis or perinephric edema. Lobulated bilateral renal contours. Partially distended urinary bladder without wall thickening. US Renal - IMPRESSION: 1. Mildly increased echogenicity of both kidneys, compatible with medical renal disease. 8.3 and 8.9 cm long.  2. No hydronephrosis.   Last creat in Epic was July 2019 creat = 1.77, eGFR 36, nothing in CE   Lab today > K 2.5 ^, BUN 72, Creat 9.90 , Ca 5.7  , P 4 , alb 2.1, CO2 < 7   Assessment/ Plan: 1. Renal failure - b/l creat from 2019 was 1.77, eGFR 36.  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  Sig N/V for weeks prior to admission. Creat 10 on admit,  down to 9's today, good UOP 850 cc. Pt still has room for volume. Also has sig acidosis, will change to sod bicarb IVF"s, lower to 150 / hr. Gave approval for PICC line but the veins were too deep. I spoke w/ dtr that hopefully she will recover enough renal function to maintain her QOL, but if she doesn't that her mother would not be a good  candidate for dialysis given her debilitated state and we would have to pursue a hospice-type situation. Dtr understands and had no questions.  2. Hypokalemia - up to 2.5, KDur 30 x 2 today and repeat in am 3. Hypocalcemia - starting Tums 2 bid 4. H/o CVA - R hemiparesis and aphasia 5. Chronic debility - bed to WC-bound, immobile x 2-3 weeks since she got sick 6. Anemia - Hb 8.5, poss CKD related      Rob Yelena Metzer 08/17/2020, 5:43 PM   Recent Labs  Lab 09/10/2020 1559 08/17/20 0400 08/17/20 0836 08/17/20 1602 08/17/20 1613  K   < >  --  <2.0*  --  2.5*  BUN   < >  --  71*  --  72*  CREATININE   < >  --  9.62*  --  9.90*  CALCIUM   < >  --  5.5*  --  5.7*  PHOS  --   --   --   --  4.0  HGB  --  7.5*  --  7.6*  --    < > = values in this interval not displayed.   Inpatient medications: . Chlorhexidine Gluconate Cloth  6 each  Topical Daily  . scopolamine  1 patch Transdermal Q72H  . sodium chloride flush  3 mL Intravenous Q12H   . sodium chloride 200 mL/hr at 08/17/20 1306  . metronidazole 500 mg (08/17/20 1552)

## 2020-08-18 DIAGNOSIS — Z7189 Other specified counseling: Secondary | ICD-10-CM

## 2020-08-18 DIAGNOSIS — R5383 Other fatigue: Secondary | ICD-10-CM

## 2020-08-18 DIAGNOSIS — N179 Acute kidney failure, unspecified: Secondary | ICD-10-CM

## 2020-08-18 DIAGNOSIS — Z515 Encounter for palliative care: Secondary | ICD-10-CM

## 2020-08-18 DIAGNOSIS — E440 Moderate protein-calorie malnutrition: Secondary | ICD-10-CM | POA: Insufficient documentation

## 2020-08-18 LAB — RENAL FUNCTION PANEL
Albumin: 2.1 g/dL — ABNORMAL LOW (ref 3.5–5.0)
Albumin: 1.9 g/dL — ABNORMAL LOW (ref 3.5–5.0)
Anion gap: 15 (ref 5–15)
Anion gap: 12 (ref 5–15)
BUN: 70 mg/dL — ABNORMAL HIGH (ref 6–20)
BUN: 66 mg/dL — ABNORMAL HIGH (ref 6–20)
CO2: 8 mmol/L — ABNORMAL LOW (ref 22–32)
CO2: 12 mmol/L — ABNORMAL LOW (ref 22–32)
Calcium: 6.2 mg/dL — CL (ref 8.9–10.3)
Calcium: 6 mg/dL — CL (ref 8.9–10.3)
Chloride: 114 mmol/L — ABNORMAL HIGH (ref 98–111)
Chloride: 110 mmol/L (ref 98–111)
Creatinine, Ser: 9.76 mg/dL — ABNORMAL HIGH (ref 0.44–1.00)
Creatinine, Ser: 8.89 mg/dL — ABNORMAL HIGH (ref 0.44–1.00)
GFR calc Af Amer: 5 mL/min — ABNORMAL LOW (ref >60)
GFR calc Af Amer: 5 mL/min — ABNORMAL LOW (ref >60)
GFR calc non Af Amer: 4 mL/min — ABNORMAL LOW (ref >60)
GFR calc non Af Amer: 4 mL/min — ABNORMAL LOW (ref >60)
Glucose, Bld: 92 mg/dL (ref 70–99)
Glucose, Bld: 237 mg/dL — ABNORMAL HIGH (ref 70–99)
Phosphorus: 3.8 mg/dL (ref 2.5–4.6)
Phosphorus: 2.4 mg/dL — ABNORMAL LOW (ref 2.5–4.6)
Potassium: 2.8 mmol/L — ABNORMAL LOW (ref 3.5–5.1)
Potassium: 2.6 mmol/L — CL (ref 3.5–5.1)
Sodium: 137 mmol/L (ref 135–145)
Sodium: 134 mmol/L — ABNORMAL LOW (ref 135–145)

## 2020-08-18 LAB — GASTROINTESTINAL PANEL BY PCR, STOOL (REPLACES STOOL CULTURE)

## 2020-08-18 LAB — MAGNESIUM: Magnesium: 2.3 mg/dL (ref 1.7–2.4)

## 2020-08-18 MED ORDER — CALCIUM GLUCONATE-NACL 2-0.675 GM/100ML-% IV SOLN
2.0000 g | Freq: Once | INTRAVENOUS | Status: AC
  Administered 2020-08-19: 2000 mg via INTRAVENOUS
  Filled 2020-08-18: qty 100

## 2020-08-18 MED ORDER — SODIUM BICARBONATE 650 MG PO TABS
650.0000 mg | ORAL_TABLET | Freq: Two times a day (BID) | ORAL | Status: DC
  Administered 2020-08-18 – 2020-08-19 (×3): 650 mg via ORAL
  Filled 2020-08-18 (×4): qty 1

## 2020-08-18 MED ORDER — POTASSIUM CHLORIDE CRYS ER 20 MEQ PO TBCR
40.0000 meq | EXTENDED_RELEASE_TABLET | Freq: Four times a day (QID) | ORAL | Status: AC
  Administered 2020-08-18 – 2020-08-19 (×2): 40 meq via ORAL
  Filled 2020-08-18 (×2): qty 2

## 2020-08-18 MED ORDER — POTASSIUM CHLORIDE 10 MEQ/100ML IV SOLN
10.0000 meq | INTRAVENOUS | Status: AC
  Administered 2020-08-18 – 2020-08-19 (×4): 10 meq via INTRAVENOUS
  Filled 2020-08-18 (×4): qty 100

## 2020-08-18 MED ORDER — CALCIUM GLUCONATE-NACL 1-0.675 GM/50ML-% IV SOLN
1.0000 g | Freq: Once | INTRAVENOUS | Status: AC
  Administered 2020-08-18: 1000 mg via INTRAVENOUS
  Filled 2020-08-18: qty 50

## 2020-08-18 MED ORDER — POTASSIUM CHLORIDE CRYS ER 20 MEQ PO TBCR
30.0000 meq | EXTENDED_RELEASE_TABLET | Freq: Four times a day (QID) | ORAL | Status: AC
  Administered 2020-08-18 (×2): 30 meq via ORAL
  Filled 2020-08-18 (×2): qty 1

## 2020-08-18 NOTE — Progress Notes (Signed)
[facility] Progress Note  Subjective: Minimal improvement in labs, Cr still in the 9s.  IV access is difficult  Vitals:   08/17/20 2108 08/18/20 0500 08/18/20 0531 08/18/20 0942  BP: 139/81  97/73 (!) 141/73  Pulse: 89  91 89  Resp: _0 Temp: 98.6 F (37 C)  98.2 F (36.8 C) 97.7 F (36.5 C)  TempSrc:   Oral Oral  SpO2: 100%  100% 100%  Weight:  (!) 137.9 kg    Height:        Exam: Gen pleasant, very obese, lying flat no distress No jvd or bruits, flat neck veins  Chest clear bilat to bases no rales or wheezing RRR no MRG Abd soft ntnd no mass or ascites +bs obese GU foley in place Ext no LE or UE edema Neuro is alert , nf, no asterixis, sig debilitated/ weakened    Home meds:  - norvasc 5 qd/ amoxcillin 500 tid/ norco qid prn   UA 0-5 wbc/ rbc, large Hb, hazy, prot 30, few bact UNa 31 CXR - No active disease. CT abd - No hydronephrosis or perinephric edema. Lobulated bilateral renal contours. Partially distended urinary bladder without wall thickening. US Renal - IMPRESSION: 1. Mildly increased echogenicity of both kidneys, compatible with medical renal disease. 8.3 and 8.9 cm long.  2. No hydronephrosis.   Last creat in Epic was July 2019 creat = 1.77, eGFR 36, nothing in CE   Lab today > K 2.5 ^, BUN 72, Creat 9.90 , Ca 5.7  , P 4 , alb 2.1, CO2 < 7   Assessment/ Plan: 1. Renal failure - b/l creat from 2019 was 1.77, eGFR 36.  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  Sig N/V for weeks prior to admission. Creat 10 on admit,  down to 9's good UOP 850 cc. Continue Na bicarb, aggressive repletion of Ca, bicarb.  Will consult pall care today- we are likely approaching a hospice situation if no improvement  2. Hypokalemia - up to 2.5--> 2.8, KDur 30 x 2 again 3. Hypocalcemia - starting Tums 2 bid 4. H/o CVA - R hemiparesis and aphasia 5. Chronic debility - bed to WC-bound, immobile x 2-3 weeks since she got sick 6. Anemia - Hb 8.5,  poss CKD related      Madelon Lips MD 08/18/2020, 11:15 AM   Recent Labs  Lab 08/17/20 0400 08/17/20 0836 08/17/20 1602 08/17/20 1613 08/18/20 0406  K  --    < >  --  2.5* 2.8*  BUN  --    < >  --  72* 70*  CREATININE  --    < >  --  9.90* 9.76*  CALCIUM  --    < >  --  5.7* 6.2*  PHOS  --   --   --  4.0 3.8  HGB 7.5*  --  7.6*  --   --    < > = values in this interval not displayed.   Inpatient medications: . calcium carbonate  400 mg of elemental calcium Oral BID WC  . Chlorhexidine Gluconate Cloth  6 each Topical Daily  . potassium chloride  30 mEq Oral Q6H  . scopolamine  1 patch Transdermal Q72H  . sodium bicarbonate  650 mg Oral BID  . sodium chloride flush  3 mL Intravenous Q12H   . calcium gluconate    . metronidazole Stopped (08/18/20 0730)  . sodium bicarbonate in D5W 1000 mL infusion  150 mL/hr at 08/18/20 [phone]

## 2020-08-18 NOTE — Plan of Care (Signed)
  Problem: Education: Goal: Knowledge of General Education information will improve Description: Including pain rating scale, medication(s)/side effects and non-pharmacologic comfort measures Outcome: Progressing   Problem: Clinical Measurements: Goal: Diagnostic test results will improve Outcome: Progressing   Problem: Coping: Goal: Level of anxiety will decrease Outcome: Progressing   

## 2020-08-18 NOTE — Progress Notes (Signed)
I was asked by Internal Medicine to place a central line for patient with poor peripheral access.  In brief patient is 59 year old female who was admitted with severe nausea and vomiting in the setting of acute colitis, noted to have acute kidney injury and severe electrolyte abnormalities. Patient currently has 1 peripheral line.  Her vital signs are stable.  I think she would be better served to have midline placed by either IV team or by IR.  Getting central access can pose high risk of central line associated bacterial infection, as it will be accessed multiple times and labs will be drawn from the central line, considering that it would harm the patient then give her benefit.  Spoke with primary team and expressed my concern that patient should not get central line but instead she should get double lumen midline by IV team or IR.   Jacky Kindle MD Critical care physician [facility]  Pager: [phone] Mobile: [phone]

## 2020-08-18 NOTE — Progress Notes (Signed)
Subjective:   Patient states that she is feeling better than yesterday. She says her nausea, abdominal pain, and generalized weakness have improved since yesterday. Her abdominal pain continues to be intermittent. She says that she has continued to have decreased PO intake due to her nausea but the scopolamine patch is helping. She says she feels tired and continues to endorse cold intolerance. She says she was told she continues to have vaginal bleeding but cannot tell. No CP, palpitations, SOB or other symptoms.  Objective:  Vital signs in last 24 hours: Vitals:   08/17/20 2108 08/18/20 0500 08/18/20 0531 08/18/20 0942  BP: 139/81  97/73 (!) 141/73  Pulse: 89  91 89  Resp: 18  16 18   Temp: 98.6 F (37 C)  98.2 F (36.8 C) 97.7 F (36.5 C)  TempSrc:   Oral Oral  SpO2: 100%  100% 100%  Weight:  (!) 137.9 kg    Height:       General: Patient is morbidly obese, appears tired but well. No acute distress. Eyes: Sclera non-icteric. No conjunctival injection.  Respiratory: Lung sounds decreased but CTA anteriorly with no wheezes, rales, or rhonchi.  Cardiovascular: Regular rate and rhythm. No murmurs, rubs, or gallops. No lower extremity edema.  Abdominal: Soft with very mild LUQ abdominal TTP. Bowel sounds intact. No rebound or guarding. No distention. Genitourinary: Scant vaginal bleeding present on pad. Skin: No lesions. No rashes.   Assessment/Plan:  Active Problems:   Rhabdomyolysis   Hypokalemia   Hypocalcemia   Acute renal failure ([facility])   Malnutrition of moderate degree  # Colitis Patient presented with ABD pain, N/V/D, decreased PO intake x 1-2 weeks. CT abdomen pelvis suggestive of diffuse colonic wall thickening with pericolonic edema involving the descending/sigmoid colon suspicious for colitis. Consider inflammatory vs. Infectious cause. Continues to experience improving nausea, int. Vomiting, and diarrhea. - C. Diff negative  - GI panel pending  - Patient on day 2  of metronidazole  - Patient on clear liquid diet  - Improving with scopolamine patch for nausea - Avoid QTc prolonging medications given prolonged QTc and   # Rhabdomyolysis # Severe AKI  Creatinine on arrival was 10.2 (up from 1.7 in 2019). Likely prerenal due to poor PO intake and diarrhea. Renal Ultrasound showed increased echogenicity consistent with medical renal disease.  - Nephrology on board, appreciate their recommendations - Patient is a poor dialysis candidate and has palliative care discussion scheduled for tomorrow, 9am with patient's daughter; will begin goals of care discussions - IVF switched to sodium bicarbonate 135mL/hr  - I&O significantly net positive although patient continues to make small amounts of yellow urine  - Will attempt to gain 2nd peripheral IV line for access  # Electrolyte Abnormalities  Hypokalemia < 2 on arrival:  Hypocalcemia Hypomagnesemia  Remains hemodynamically stable despite EKG with prolonged QTc.   # Stable Normocytic Anemia  Initial Hgb 8.3 --> 7.5 --> 7.6. Iron panel WNL except for high TIBC. Likely due to worsening renal disease, although patient also has continued scant vaginal bleeding.  - On SCDs for DVT PPx - Continue to monitor CBC, vaginal bleeding - will need OP pap smear, TVUS  # Hypertension  Single hypotensive reading although otherwise BP stable and mildly elevated. Patient states not taking amlodipine at home.  - Will hold off on antihypertensives for now and continue to monitor.  Prior to Admission Living Arrangement: Home  Anticipated Discharge Location: SNF vs. Hospice  Barriers to Discharge: Electrolyte instability, Poor  renal function, IV Florene Route, MD 08/18/2020, 2:03 PM Pager: [phone] After 5pm on weekdays and 1pm on weekends: On Call pager [phone]

## 2020-08-18 NOTE — Progress Notes (Signed)
Initial Nutrition Assessment  DOCUMENTATION CODES:   Non-severe (moderate) malnutrition in context of chronic illness  INTERVENTION:   Boost Breeze po TID, each supplement provides 250 kcal and 9 grams of protein  NUTRITION DIAGNOSIS:   Moderate Malnutrition related to chronic illness (chronic renal insufficiency) as evidenced by percent weight loss, mild muscle depletion, energy intake < 75% for > or equal to 1 month (22% weight loss x 3 months).  GOAL:   Patient will meet greater than or equal to 90% of their needs  MONITOR:   PO intake, Supplement acceptance, Diet advancement, Skin, Labs  REASON FOR ASSESSMENT:   Malnutrition Screening Tool    ASSESSMENT:   59 yo female admitted with severe AKI, hypokalemia, hypocalcemia, hypomagnesemia. PMH includes CVA with residual R sided paralysis, wheelchair bound, HTN, chronic renal insufficiency.   Patient reports minimal oral intake for the past 2 weeks. She was able to tolerate small amounts of broth at home, but was not eating anything else for 2 weeks. Currently on clear liquids, eating nothing on meal trays per patient due to nausea.  Per Nephrology notes, patient is not a long-term HD candidate.  Hypokalemia and hypocalcemia are being repleted.  PICC line unable to be placed because veins too deep.   Labs reviewed. K 2.5, BUN 72, Creat 9.9, calcium corrected to 7.22  Medications reviewed and include calcium carbonate, potassium chloride. IVF: sodium bicarbonate 100 mEq in D5 at 150 ml/h  Patient has had a 22% weight loss within the past 3 months. Unable to determine amount of actual (not fluid related) weight loss, but with recent minimal intake, she has lost some lean body mass. Mild muscle depletion present. Suspect a lot of her weight loss is related to minimal intake. Suspect current edema is masking some of her muscle and fat depletion and that she may actually be malnourished to a higher degree than currently able to  establish.   NUTRITION - FOCUSED PHYSICAL EXAM:    Most Recent Value  Orbital Region No depletion  Upper Arm Region No depletion  Thoracic and Lumbar Region No depletion  Buccal Region No depletion  Temple Region Mild depletion  Clavicle Bone Region Mild depletion  Clavicle and Acromion Bone Region No depletion  Scapular Bone Region No depletion  Dorsal Hand Mild depletion  Patellar Region No depletion  Anterior Thigh Region No depletion  Posterior Calf Region No depletion  Edema (RD Assessment) Moderate  Hair Reviewed  Eyes Reviewed  Mouth Reviewed  Skin Reviewed  Nails Reviewed       Diet Order:   Diet Order            Diet clear liquid Room service appropriate? Yes; Fluid consistency: Thin  Diet effective now                 EDUCATION NEEDS:   Not appropriate for education at this time  Skin:  Skin Assessment: Reviewed RN Assessment (MASD; fissure to buttocks)  Last BM:  9/5 type 7  Height:   Ht Readings from Last 1 Encounters:  08/26/2020 5' 6.5" (1.689 m)    Weight:   Wt Readings from Last 1 Encounters:  08/18/20 (!) 137.9 kg    Ideal Body Weight:  60.2 kg  BMI:  Body mass index is 48.33 kg/m.  Estimated Nutritional Needs:   Kcal:  1900-2200  Protein:  100-130 gm  Fluid:  1.9-2.2 L    Lucas Mallow, RD, LDN, CNSC Please refer to Amion for contact  information.

## 2020-08-18 NOTE — Progress Notes (Signed)
[facility]ST consult received to obtain 2nd IV access as this patient has multiple fluids and meds that are needed. PICC team attempted unsuccessfully to place PICC 08/17/20. VAST was under the impression that PICC would be placed by IR on 9/6. VAST reached out to care team via [place]. Jeralyn Bennett, MD was informed by someone in IR "they do not typically place PICC/central lines and was told to call PCCM. PCCM did not feel central line was indicated and were concerned it would get infected on the floor." Konrad Penta, MD and Davis Gourd, NP felt that the best access for this patient would be a PICC. A midline is not an option as pt is receiving IV meds that cannot infuse via a midline.  VAST will attempt to place a 2nd USGIV and will notify MD if unsuccessful.

## 2020-08-18 NOTE — Consult Note (Signed)
Consultation Note Date: 08/18/2020   Patient Name: Cristina Jordan  DOB: 04-05-1961  MRN: [id number]  Age / Sex: 59 y.o., female  PCP: Patient, No Pcp Per Referring Physician: Sid Falcon, MD  Reason for Consultation: Establishing goals of care and Psychosocial/spiritual support  HPI/Patient Profile: 59 y.o. female   admitted on 09/10/2020 with  h/o CVA, WC bound, presenting with worsening weakness.  She also has nausea, vomiting, decreased PO intake, abdominal pain, diarrhea X 1 week and weak to the point of not being able to get out of bed.    She was found to have colitis, Severe AKI, and electrolyte abnormalities.    Of note, she had all teeth pulled about 4 days prior to admission and has been on amoxicillin.   Worsening renal failure, creatinine today is 1.76, patient is lethargic.  Patient and family face treatment option decisions, advanced directive decisions and anticipatory care needs.   Clinical Assessment and Goals of Care:   This NP Wadie Lessen reviewed medical records, received report from team, assessed the patient and then spoke to her daughter/Cristina Jordan by telephone at patient's bedside  to discuss diagnosis, prognosis, GOC, disposition and options.  Patient lethargy prevented her from participating in conversation today   Concept of Palliative Care was introduced as specialized medical care for people and their families living with serious illness.  If focuses on providing relief from the symptoms and stress of a serious illness.  The goal is to improve quality of life for both the patient and the family.   Education offered today regarding advanced directives.  Concepts specific to code status, artifical feeding and hydration, continued IV antibiotics and rehospitalization was had.  The difference between a aggressive medical intervention path  and a palliative comfort care path for  this patient at this time was had.  Values and goals of care important to patient and family were attempted to be elicited.     Questions and concerns addressed.  Patient  encouraged to call with questions or concerns.     PMT will continue to support holistically.     No documented healthcare power of attorney or advanced directive.  Education offered regarding the  importance of documenting and offered support via spiritual care department and securing these documents.  Plan is to meet tomorrow morning 0900 with daughter at bedside for continued goals of care conversation.    SUMMARY OF RECOMMENDATIONS    Code Status/Advance Care Planning:  Full code   Palliative Prophylaxis:   Aspiration, Bowel Regimen, Delirium Protocol and Frequent Pain Assessment  Additional Recommendations (Limitations, Scope, Preferences):  Full Scope Treatment  Psycho-social/Spiritual:   Desire for further Chaplaincy support:yes   Prognosis:   Unable to determine  Discharge Planning: To Be Determined      Primary Diagnoses: Present on Admission: . Rhabdomyolysis   I have reviewed the medical record, interviewed the patient and family, and examined the patient. The following aspects are pertinent.  Past Medical History:  Diagnosis Date  . Hypertension   .  Stroke [facility])    Social History   Socioeconomic History  . Marital status: Divorced    Spouse name: Not on file  . Number of children: Not on file  . Years of education: Not on file  . Highest education level: Not on file  Occupational History  . Not on file  Tobacco Use  . Smoking status: Never Smoker  . Smokeless tobacco: Never Used  Substance and Sexual Activity  . Alcohol use: Not Currently  . Drug use: Never  . Sexual activity: Not Currently  Other Topics Concern  . Not on file  Social History Narrative  . Not on file   Social Determinants of Health   Financial Resource Strain:   . Difficulty of Paying Living  Expenses: Not on file  Food Insecurity:   . Worried About Charity fundraiser in the Last Year: Not on file  . Ran Out of Food in the Last Year: Not on file  Transportation Needs:   . Lack of Transportation (Medical): Not on file  . Lack of Transportation (Non-Medical): Not on file  Physical Activity:   . Days of Exercise per Week: Not on file  . Minutes of Exercise per Session: Not on file  Stress:   . Feeling of Stress : Not on file  Social Connections:   . Frequency of Communication with Friends and Family: Not on file  . Frequency of Social Gatherings with Friends and Family: Not on file  . Attends Religious Services: Not on file  . Active Member of Clubs or Organizations: Not on file  . Attends Archivist Meetings: Not on file  . Marital Status: Not on file   History reviewed. No pertinent family history. Scheduled Meds: . calcium carbonate  400 mg of elemental calcium Oral BID WC  . Chlorhexidine Gluconate Cloth  6 each Topical Daily  . scopolamine  1 patch Transdermal Q72H  . sodium bicarbonate  650 mg Oral BID  . sodium chloride flush  3 mL Intravenous Q12H   Continuous Infusions: . metronidazole 500 mg (08/18/20 1413)  . sodium bicarbonate in D5W 1000 mL infusion 150 mL/hr at 08/18/20 1208   PRN Meds:.acetaminophen Medications Prior to Admission:  Prior to Admission medications   Medication Sig Start Date End Date Taking? Authorizing Provider  amoxicillin (AMOXIL) 500 MG capsule Take 500 mg by mouth 3 (three) times daily. 08/12/20  Yes [provider]  HYDROcodone-acetaminophen (NORCO) 10-325 MG tablet Take 1 tablet by mouth every 6 (six) hours as needed (pain).  08/12/20  Yes [provider]  OVER THE COUNTER MEDICATION Take 2 tablets by mouth daily as needed (pain). "tylenol migraine"   Yes [provider]  amLODipine (NORVASC) 5 MG tablet Take 1 tablet (5 mg total) by mouth daily. Patient not taking: Reported on 05/27/2020 06/14/18    Horton, Barbette Hair, MD   No Active Allergies Review of Systems  Physical Exam  Vital Signs: BP (!) 141/73 (BP Location: Left Wrist)   Pulse 89   Temp 97.7 F (36.5 C) (Oral)   Resp 18   Ht 5' 6.5" (1.689 m)   Wt (!) 137.9 kg   SpO2 100%   BMI 48.33 kg/m  Pain Scale: 0-10   Pain Score: 0-No pain   SpO2: SpO2: 100 % O2 Device:SpO2: 100 % O2 Flow Rate: .   IO: Intake/output summary:   Intake/Output Summary (Last 24 hours) at 08/18/2020 1420 Last data filed at 08/18/2020 1300 Gross per 24  hour  Intake 3512.96 ml  Output 850 ml  Net 2662.96 ml    LBM: Last BM Date: 08/17/20 Baseline Weight: Weight: (!) 177 kg Most recent weight: Weight: (!) 137.9 kg     Palliative Assessment/Data: 30 % at best   Discussed with Dr Myrtie Hawk  Time In: 1310 Time Out: 1420 Time Total: 70 minutes Greater than 50%  of this time was spent counseling and coordinating care related to the above assessment and plan.  Signed by: Wadie Lessen, NP   Please contact Palliative Medicine Team phone at [phone] for questions and concerns.  For individual provider: See Shea Evans

## 2020-08-19 DIAGNOSIS — M6282 Rhabdomyolysis: Secondary | ICD-10-CM | POA: Diagnosis not present

## 2020-08-19 DIAGNOSIS — R531 Weakness: Secondary | ICD-10-CM

## 2020-08-19 DIAGNOSIS — E876 Hypokalemia: Secondary | ICD-10-CM

## 2020-08-19 DIAGNOSIS — K529 Noninfective gastroenteritis and colitis, unspecified: Secondary | ICD-10-CM | POA: Diagnosis not present

## 2020-08-19 DIAGNOSIS — I10 Essential (primary) hypertension: Secondary | ICD-10-CM

## 2020-08-19 DIAGNOSIS — N179 Acute kidney failure, unspecified: Secondary | ICD-10-CM | POA: Diagnosis not present

## 2020-08-19 DIAGNOSIS — Z66 Do not resuscitate: Secondary | ICD-10-CM

## 2020-08-19 LAB — RENAL FUNCTION PANEL
Albumin: 1.8 g/dL — ABNORMAL LOW (ref 3.5–5.0)
Albumin: 1.9 g/dL — ABNORMAL LOW (ref 3.5–5.0)
Anion gap: 14 (ref 5–15)
Anion gap: 14 (ref 5–15)
BUN: 66 mg/dL — ABNORMAL HIGH (ref 6–20)
BUN: 62 mg/dL — ABNORMAL HIGH (ref 6–20)
CO2: 12 mmol/L — ABNORMAL LOW (ref 22–32)
CO2: 15 mmol/L — ABNORMAL LOW (ref 22–32)
Calcium: 6.6 mg/dL — ABNORMAL LOW (ref 8.9–10.3)
Calcium: 6.7 mg/dL — ABNORMAL LOW (ref 8.9–10.3)
Chloride: 114 mmol/L — ABNORMAL HIGH (ref 98–111)
Chloride: 111 mmol/L (ref 98–111)
Creatinine, Ser: 8.79 mg/dL — ABNORMAL HIGH (ref 0.44–1.00)
Creatinine, Ser: 8.43 mg/dL — ABNORMAL HIGH (ref 0.44–1.00)
GFR calc Af Amer: 5 mL/min — ABNORMAL LOW (ref >60)
GFR calc Af Amer: 6 mL/min — ABNORMAL LOW (ref >60)
GFR calc non Af Amer: 5 mL/min — ABNORMAL LOW (ref >60)
GFR calc non Af Amer: 5 mL/min — ABNORMAL LOW (ref >60)
Glucose, Bld: 106 mg/dL — ABNORMAL HIGH (ref 70–99)
Glucose, Bld: 115 mg/dL — ABNORMAL HIGH (ref 70–99)
Phosphorus: 2.1 mg/dL — ABNORMAL LOW (ref 2.5–4.6)
Phosphorus: 1.9 mg/dL — ABNORMAL LOW (ref 2.5–4.6)
Potassium: 3.1 mmol/L — ABNORMAL LOW (ref 3.5–5.1)
Potassium: 3.3 mmol/L — ABNORMAL LOW (ref 3.5–5.1)
Sodium: 140 mmol/L (ref 135–145)
Sodium: 140 mmol/L (ref 135–145)

## 2020-08-19 LAB — CBC
HCT: 18.9 % — ABNORMAL LOW (ref 36.0–46.0)
Hemoglobin: 6.6 g/dL — CL (ref 12.0–15.0)
MCH: 29.9 pg (ref 26.0–34.0)
MCHC: 34.9 g/dL (ref 30.0–36.0)
MCV: 85.5 fL (ref 80.0–100.0)
Platelets: 215 10*3/uL (ref 150–400)
RBC: 2.21 MIL/uL — ABNORMAL LOW (ref 3.87–5.11)
RDW: 22.5 % — ABNORMAL HIGH (ref 11.5–15.5)
WBC: 9.4 10*3/uL (ref 4.0–10.5)
nRBC: 0.6 % — ABNORMAL HIGH (ref 0.0–0.2)

## 2020-08-19 LAB — GLUCOSE, CAPILLARY
Glucose-Capillary: 99 mg/dL (ref 70–99)
Glucose-Capillary: 109 mg/dL — ABNORMAL HIGH (ref 70–99)
Glucose-Capillary: 122 mg/dL — ABNORMAL HIGH (ref 70–99)

## 2020-08-19 LAB — MAGNESIUM
Magnesium: 1.8 mg/dL (ref 1.7–2.4)
Magnesium: 1.7 mg/dL (ref 1.7–2.4)

## 2020-08-19 LAB — ABO/RH: ABO/RH(D): O POS

## 2020-08-19 LAB — PREPARE RBC (CROSSMATCH)

## 2020-08-19 LAB — HEMOGLOBIN AND HEMATOCRIT, BLOOD
HCT: 22 % — ABNORMAL LOW (ref 36.0–46.0)
Hemoglobin: 7.6 g/dL — ABNORMAL LOW (ref 12.0–15.0)

## 2020-08-19 MED ORDER — CALCIUM GLUCONATE-NACL 2-0.675 GM/100ML-% IV SOLN
2.0000 g | Freq: Once | INTRAVENOUS | Status: AC
  Administered 2020-08-19: 2000 mg via INTRAVENOUS
  Filled 2020-08-19: qty 100

## 2020-08-19 MED ORDER — POTASSIUM CHLORIDE 10 MEQ/100ML IV SOLN
10.0000 meq | INTRAVENOUS | Status: AC
  Administered 2020-08-19 (×3): 10 meq via INTRAVENOUS
  Filled 2020-08-19 (×3): qty 100

## 2020-08-19 MED ORDER — PANTOPRAZOLE SODIUM 40 MG PO TBEC
40.0000 mg | DELAYED_RELEASE_TABLET | Freq: Every day | ORAL | Status: DC
  Administered 2020-08-19 – 2020-08-21 (×2): 40 mg via ORAL
  Filled 2020-08-19 (×5): qty 1

## 2020-08-19 MED ORDER — POTASSIUM CHLORIDE 10 MEQ/100ML IV SOLN
10.0000 meq | INTRAVENOUS | Status: AC
  Administered 2020-08-19 (×4): 10 meq via INTRAVENOUS
  Filled 2020-08-19 (×4): qty 100

## 2020-08-19 MED ORDER — SODIUM CHLORIDE 0.9% IV SOLUTION: Freq: Once | INTRAVENOUS | Status: AC

## 2020-08-19 MED ORDER — POTASSIUM CHLORIDE CRYS ER 20 MEQ PO TBCR
40.0000 meq | EXTENDED_RELEASE_TABLET | Freq: Four times a day (QID) | ORAL | Status: AC
  Administered 2020-08-19: 40 meq via ORAL
  Filled 2020-08-19: qty 2

## 2020-08-19 MED ORDER — POTASSIUM PHOSPHATES 15 MMOLE/5ML IV SOLN
20.0000 mmol | Freq: Once | INTRAVENOUS | Status: AC
  Administered 2020-08-20: 20 mmol via INTRAVENOUS
  Filled 2020-08-19: qty 6.67

## 2020-08-19 NOTE — Progress Notes (Signed)
Patient ID: Cristina Jordan, female   DOB: 1961/01/27, 59 y.o.   MRN: [id number]  This NP visited patient at the bedside as a follow up to  yesterday's [place] and to meet with daughter/Cristina Jordan at the bedside for continued conversation regarding current medical situation; diagnosis, prognosis, treatment option decisions, advanced directive decisions, and anticipatory care needs.  Patient is alert and oriented and is able to participate in discussion regarding her current hospitalization and related diagnoses.  Education offered regarding end-stage renal disease.  Discussed concepts specific to the logistics of dialysis.  We discussed the impact of end-stage renal disease on electrolyte imbalances anemia of chronic disease.  Patient is open to all offered and available medical interventions to prolong life.  She verbalizes she would agree to dialysis if offered.  Education offered on the importance of advanced care planning and naming a H POA.   Education offered regarding DNR/DNI status understanding evidenced based poor outcomes in similar hospitalized patient, as the cause of arrest is likely associated with advanced chronic illness rather than an easily reversible acute cardio-pulmonary event.  Patient opts for DNR/DNI status and daughter supports this decision.  Advance care planning blue book left for review and spiritual care consulted to assist with creating advance care planning documents.  Discussed with patient the importance of continued conversation with her family and the medical providers regarding overall plan of care and treatment options,  ensuring decisions are within the context of the patients values and GOCs.  Questions and concerns addressed   Discussed with [place] via secure chat  PMT will continue to support holistically  Total time spent on the unit was 35 minutes Greater than 50% of the time was spent in counseling and coordination of care  Wadie Lessen NP   Palliative Medicine Team Team Phone # [phone] Pager [phone]

## 2020-08-19 NOTE — Progress Notes (Signed)
Subjective:   This morning, patient states that she continues to have intermittent abdominal pain that has remained constant since admission but says her pain is currently resolved. She endorses mild nausea that has improved with her scopolamine patch. She says she has vomited x 4 in the hospital but denies blood in her vomit. She is concerned regarding her diarrhea, saying it continues. She is unsure if it has improved or not. She denies fatigue and tiredness and chills currently. She says she has continued to have vaginal bleeding, unusual for her. Denies CP, SOB, palpitations, light-headedness, and dizziness.   Objective:  Vital signs in last 24 hours: Vitals:   08/18/20 0531 08/18/20 0942 08/18/20 1624 08/18/20 2100  BP: 97/73 (!) 141/73 135/78 135/77  Pulse: 91 89 98 94  Resp: 16 18 20 14   Temp: 98.2 F (36.8 C) 97.7 F (36.5 C) 97.6 F (36.4 C) 98.3 F (36.8 C)  TempSrc: Oral Oral Oral Oral  SpO2: 100% 100% 100% 100%  Weight:      Height:       General: Patient is morbidly obese, appears tired but well. No acute distress. Eyes: Sclera non-icteric. No conjunctival injection.  Respiratory: Lung sounds decreased but clear to auscultation bilaterally without wheezing, rales, rhonchi.  Cardiovascular: Regular rate and rhythm. No murmurs, rubs, or gallops. No lower extremity edema.  Abdominal: Soft with no abdominal TTP. No obvious distention. Bowel sounds intact. No guarding or rebound. Flexi-seal in place, draining watery brown stool that does not appear to contain gross blood. Genitourinary: There is increased vaginal bleeding since yesterday's examination. No obvious hematuria with foley in place. Skin: Healed, old midline scar on abdomen. No rashes noted.    Assessment/Plan:  Active Problems:   Rhabdomyolysis   Hypokalemia   Hypocalcemia   Acute renal failure (HCC)   Malnutrition of moderate degree   Palliative care by specialist   DNR (do not resuscitate) discussion    Lethargy  # Colitis Patient presented with ABD pain, N/V/D, decreased PO intake x 1-2 weeks. CT abdomen pelvis suggestive of diffuse colonic wall thickening with pericolonic edema involving the descending/sigmoid colon suspicious for colitis. Initial WBC 15.1, LA WNL.  - C. Diff and GI panel negative, very likely inflammatory  - Patient on day 3 of metronidazole 500mg  TID - discontinued this morning  - Patient may require prednisone, though will hold and see how bowel rest improves symptoms. Flexi-seal in place. - Started Protonix 40mg  daily  - Patient on clear liquid diet; continue to encourage PO intake  - Continue scopolamine for nausea, avoid QTc-prolonging medications given prolonged QTc on EKG.  # Mild Rhabdomyolysis CK 1266, U/A large hemoglobin, no RBC's. 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  - Continue IVF   # Severe AKI  Creatinine on arrival was 10.23 (up from 1.7 in 2019), BUN 73. Improving slowly to today to 8.79 and 66, respectively. GFR 4 --> 5, previous 36 in 2019. Likely prerenal due to prolonged poor PO intake and diarrhea. Renal Ultrasound showed increased echogenicity consistent with medical renal disease.  - Nephrology on board, appreciate their recommendations - Patient is a poor dialysis candidate and had consult with daughter and palliative care this morning. She was changed from full code to DNR/DNI and discussions regarding complications of renal disease and advantages of comfort care were discussed. Patient seemed surprised by severity of her current medical state, but seemed to understand. Daughter  on board. Will continue goals of care discussions. - Continue sodium bicarbonate 137mEq, 5% dextrose at 155mL/hr; will check more frequent CBG given unknown hx of DM with increased BG yesterday  - I&O significantly net positive although patient continues to make small amounts of  yellow urine   # Electrolyte Abnormalities with Moderate Malnutrition Patient has only been consuming broth for at least 2 weeks at home with N/V/D. Nutrition endorses 22% weight loss over past 3 months. Patient was able to receive 2nd peripheral line in LUE. May consider midline if patient demonstrates need for this. Hypokalemia < 2 on arrival: 3.1 today, given 31mEq IV KCl this morning Hypocalcemia - corrected to 6.8 on arrival: corrects to 8.4 today, given 2g calcium gluconate and TUMS Hypomagnesemia - Initially 1.4: 1.8 today Hypophosphatemia - slightly low at 2.4: phosphorous 2.1 today, continue to monitor  - Remains hemodynamically stable despite EKG with prolonged QTc. Continue cardiac monitoring - Nephrology also following - Nutrition started patient on nutrition supplement, will monitor renal function panel and magnesium this afternoon and TID starting tomorrow  # Stable Normocytic Anemia  Initial Hgb 8.3 --> 7.5 --> 7.6 --> 6.6 today. Iron panel WNL except for high TIBC, although ferritin 86 may be falsely elevated. Likely due to worsening renal disease, although patient also has continued and slightly worsening vaginal bleeding (10 years post-menopausal without regular OB-GYN follow-up). - Gave 1 RBC transfusion - Will recheck CBC and monitor daily CBC's / bleeding - Will hold off on iron for now given GI inflammation though will likely require this vs. EPO in future - On SCDs for DVT PPx - will need OP pap smear, TVUS if patient does not go to hospice  # Hypertension  Again, single hypotensive reading although otherwise BP stable and mildly elevated. Patient states not taking amlodipine at home.  - Will hold off on antihypertensives for now and continue to monitor.  Prior to Admission Living Arrangement: Home  Anticipated Discharge Location: SNF vs. Hospice  Barriers to Discharge: Electrolyte instability, Poor renal function, Active Colitis  Code Status: DNR/DNI Diet: Clear  Liquids IVF: NaHCO3 167mEq, D5 175mL/hr DVT PPx: SCD's  Jeralyn Bennett, MD 08/19/2020, 5:46 AM Pager: [phone] After 5pm on weekdays and 1pm on weekends: On Call pager [phone]

## 2020-08-19 NOTE — Progress Notes (Signed)
   08/19/20 1100  Clinical Encounter Type  Visited With Patient  Visit Type Follow-up  Referral From Nurse  Consult/Referral To Chaplain  Spiritual Encounters  Spiritual Needs Other (Comment) (Daughter  wanted to discuss AD)  Chaplain responded to Pt. Consult.  Ms. Cristina Jordan stated her daughter wanted to dicussed AD and will be back tomorrow.  Informed Pt. to have nurse page Chaplain when daughter arrives.

## 2020-08-19 NOTE — Progress Notes (Signed)
[facility] Progress Note  Subjective: PICC still difficult to place  Vitals:   08/18/20 1624 08/18/20 2100 08/19/20 0612 08/19/20 0945  BP: 135/78 135/77 (!) 92/59 (!) 144/82  Pulse: 98 94 100 95  Resp: 20 14 16 14  Temp: 97.6 F (36.4 C) 98.3 F (36.8 C) 98.2 F (36.8 C) 98.9 F (37.2 C)  TempSrc: Oral Oral Oral Oral  SpO2: 100% 100% 100% 100%  Weight:      Height:        Exam: Gen pleasant, very obese, sitting in bed NECK: No jvd or bruits, flat neck veins  PULM: clear bilat to bases no rales or wheezing CV: RRR no MRG ABD: soft ntnd no mass or ascites +bs obese Ext no LE or UE edema Neuro is alert , nf, no asterixis, sig debilitated/ weakened    Home meds:  - norvasc 5 qd/ amoxcillin 500 tid/ norco qid prn   UA 0-5 wbc/ rbc, large Hb, hazy, prot 30, few bact UNa 31 CXR - No active disease. CT abd - No hydronephrosis or perinephric edema. Lobulated bilateral renal contours. Partially distended urinary bladder without wall thickening. US Renal - IMPRESSION: 1. Mildly increased echogenicity of both kidneys, compatible with medical renal disease. 8.3 and 8.9 cm long.  2. No hydronephrosis.   Last creat in Epic was July 2019 creat = 1.77, eGFR 36, nothing in CE   Lab today > K 2.5 ^, BUN 72, Creat 9.90 , Ca 5.7  , P 4 , alb 2.1, CO2 < 7   Assessment/ Plan: 1. Renal failure - b/l creat from 2019 was 1.77, eGFR 36.  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  Sig N/V for weeks prior to admission. Creat 10 on admit,  down to 9's--> 8.77 today 08/19/20. Continue Na bicarb, aggressive repletion of Ca, bicarb, K.  Will consult pall care today- we are likely approaching a hospice situation if no improvement  She is a very poor dialysis candidate.    2. Hypokalemia - up to 2.5--> 2.8--> 3.1.  IV and PO repletion 3. Hypocalcemia - starting Tums 2 bid, calcium gluconate 2 g prn 4. H/o CVA - R hemiparesis and aphasia 5. Chronic debility - bed to WC-bound,  immobile x 2-3 weeks since she got sick 6. Anemia - Hb 8.5--> 6.6, getting I u pRBCs today      Elizabeth Upton MD 08/19/2020, 12:15 PM   Recent Labs  Lab 08/17/20 1602 08/17/20 1613 08/18/20 1942 08/19/20 0656  K  --    < > 2.6* 3.1*  BUN  --    < > 66* 66*  CREATININE  --    < > 8.89* 8.79*  CALCIUM  --    < > 6.0* 6.6*  PHOS  --    < > 2.4* 2.1*  HGB 7.6*  --   --  6.6*   < > = values in this interval not displayed.   Inpatient medications: . sodium chloride   Intravenous Once  . calcium carbonate  400 mg of elemental calcium Oral BID WC  . Chlorhexidine Gluconate Cloth  6 each Topical Daily  . pantoprazole  40 mg Oral Daily  . scopolamine  1 patch Transdermal Q72H  . sodium bicarbonate  650 mg Oral BID  . sodium chloride flush  3 mL Intravenous Q12H   . calcium gluconate    . potassium chloride 10 mEq (08/19/20 1102)  . sodium bicarbonate in D5W 1000 mL infusion 150   mL/hr at 08/19/20 1102          

## 2020-08-19 NOTE — Progress Notes (Signed)
Pt was complaining of infusing potassium stinging. Infusion rate decreased from 100 to 75. Will reassess.

## 2020-08-19 NOTE — Plan of Care (Signed)
  Problem: Clinical Measurements: Goal: Ability to maintain clinical measurements within normal limits will improve Outcome: Progressing   Problem: Nutrition: Goal: Adequate nutrition will be maintained Outcome: Progressing   Problem: Coping: Goal: Level of anxiety will decrease Outcome: Progressing   

## 2020-08-19 NOTE — Progress Notes (Signed)
Pt. A&Ox4. Respiration even and unlabored, heart rate regular/rhythm, skin warm, dry, intact. Assisted with q2 turns. Foley in place; drainage is yellow/clear. Pt reports "my stomach is uncomfortable"; she has had several diarrhea episodes with FMS in place.  Bloody drainage noted from her vagina. Assisted with peri/foley care x4. Pt. Has severely limited mobility due to hx of stroke.  Pt currently receiving first bag of blood; tolerating appropriately.

## 2020-08-20 DIAGNOSIS — R1010 Upper abdominal pain, unspecified: Secondary | ICD-10-CM

## 2020-08-20 LAB — RENAL FUNCTION PANEL
Albumin: 1.9 g/dL — ABNORMAL LOW (ref 3.5–5.0)
Albumin: 1.8 g/dL — ABNORMAL LOW (ref 3.5–5.0)
Albumin: 1.9 g/dL — ABNORMAL LOW (ref 3.5–5.0)
Anion gap: 17 — ABNORMAL HIGH (ref 5–15)
Anion gap: 15 (ref 5–15)
Anion gap: 15 (ref 5–15)
BUN: 62 mg/dL — ABNORMAL HIGH (ref 6–20)
BUN: 60 mg/dL — ABNORMAL HIGH (ref 6–20)
BUN: 61 mg/dL — ABNORMAL HIGH (ref 6–20)
CO2: 14 mmol/L — ABNORMAL LOW (ref 22–32)
CO2: 17 mmol/L — ABNORMAL LOW (ref 22–32)
CO2: 19 mmol/L — ABNORMAL LOW (ref 22–32)
Calcium: 6.7 mg/dL — ABNORMAL LOW (ref 8.9–10.3)
Calcium: 6.6 mg/dL — ABNORMAL LOW (ref 8.9–10.3)
Calcium: 6.4 mg/dL — CL (ref 8.9–10.3)
Chloride: 105 mmol/L (ref 98–111)
Chloride: 104 mmol/L (ref 98–111)
Chloride: 101 mmol/L (ref 98–111)
Creatinine, Ser: 8.33 mg/dL — ABNORMAL HIGH (ref 0.44–1.00)
Creatinine, Ser: 8.09 mg/dL — ABNORMAL HIGH (ref 0.44–1.00)
Creatinine, Ser: 7.81 mg/dL — ABNORMAL HIGH (ref 0.44–1.00)
GFR calc Af Amer: 6 mL/min — ABNORMAL LOW (ref >60)
GFR calc Af Amer: 6 mL/min — ABNORMAL LOW (ref >60)
GFR calc Af Amer: 6 mL/min — ABNORMAL LOW (ref >60)
GFR calc non Af Amer: 5 mL/min — ABNORMAL LOW (ref >60)
GFR calc non Af Amer: 5 mL/min — ABNORMAL LOW (ref >60)
GFR calc non Af Amer: 5 mL/min — ABNORMAL LOW (ref >60)
Glucose, Bld: 119 mg/dL — ABNORMAL HIGH (ref 70–99)
Glucose, Bld: 113 mg/dL — ABNORMAL HIGH (ref 70–99)
Glucose, Bld: 113 mg/dL — ABNORMAL HIGH (ref 70–99)
Phosphorus: 2.9 mg/dL (ref 2.5–4.6)
Phosphorus: 2.7 mg/dL (ref 2.5–4.6)
Phosphorus: 2.5 mg/dL (ref 2.5–4.6)
Potassium: 3 mmol/L — ABNORMAL LOW (ref 3.5–5.1)
Potassium: 3.2 mmol/L — ABNORMAL LOW (ref 3.5–5.1)
Potassium: 3 mmol/L — ABNORMAL LOW (ref 3.5–5.1)
Sodium: 136 mmol/L (ref 135–145)
Sodium: 136 mmol/L (ref 135–145)
Sodium: 135 mmol/L (ref 135–145)

## 2020-08-20 LAB — MAGNESIUM
Magnesium: 1.8 mg/dL (ref 1.7–2.4)
Magnesium: 1.7 mg/dL (ref 1.7–2.4)
Magnesium: 1.6 mg/dL — ABNORMAL LOW (ref 1.7–2.4)

## 2020-08-20 LAB — CBC
HCT: 22.7 % — ABNORMAL LOW (ref 36.0–46.0)
Hemoglobin: 7.8 g/dL — ABNORMAL LOW (ref 12.0–15.0)
MCH: 28.7 pg (ref 26.0–34.0)
MCHC: 34.4 g/dL (ref 30.0–36.0)
MCV: 83.5 fL (ref 80.0–100.0)
Platelets: 186 10*3/uL (ref 150–400)
RBC: 2.72 MIL/uL — ABNORMAL LOW (ref 3.87–5.11)
RDW: 20.6 % — ABNORMAL HIGH (ref 11.5–15.5)
WBC: 12.3 10*3/uL — ABNORMAL HIGH (ref 4.0–10.5)
nRBC: 0.4 % — ABNORMAL HIGH (ref 0.0–0.2)

## 2020-08-20 LAB — TYPE AND SCREEN
ABO/RH(D): O POS
Antibody Screen: NEGATIVE
Unit division: 0

## 2020-08-20 LAB — BPAM RBC
Blood Product Expiration Date: 202110072359
ISSUE DATE / TIME: 202109071554
Unit Type and Rh: 5100

## 2020-08-20 LAB — GLUCOSE, CAPILLARY
Glucose-Capillary: 112 mg/dL — ABNORMAL HIGH (ref 70–99)
Glucose-Capillary: 116 mg/dL — ABNORMAL HIGH (ref 70–99)
Glucose-Capillary: 116 mg/dL — ABNORMAL HIGH (ref 70–99)
Glucose-Capillary: 120 mg/dL — ABNORMAL HIGH (ref 70–99)

## 2020-08-20 MED ORDER — SODIUM BICARBONATE 650 MG PO TABS
1300.0000 mg | ORAL_TABLET | Freq: Three times a day (TID) | ORAL | Status: DC
  Filled 2020-08-20 (×4): qty 2

## 2020-08-20 MED ORDER — POTASSIUM CHLORIDE 10 MEQ/100ML IV SOLN
10.0000 meq | INTRAVENOUS | Status: AC
  Administered 2020-08-20 (×3): 10 meq via INTRAVENOUS
  Filled 2020-08-20 (×3): qty 100

## 2020-08-20 MED ORDER — CALCIUM GLUCONATE-NACL 2-0.675 GM/100ML-% IV SOLN
2.0000 g | Freq: Once | INTRAVENOUS | Status: AC
  Administered 2020-08-20: 2000 mg via INTRAVENOUS
  Filled 2020-08-20: qty 100

## 2020-08-20 MED ORDER — POTASSIUM CHLORIDE CRYS ER 20 MEQ PO TBCR: 40.0000 meq | EXTENDED_RELEASE_TABLET | Freq: Four times a day (QID) | ORAL | Status: DC

## 2020-08-20 MED ORDER — POTASSIUM CHLORIDE 10 MEQ/100ML IV SOLN
10.0000 meq | INTRAVENOUS | Status: AC
  Administered 2020-08-21 (×3): 10 meq via INTRAVENOUS
  Filled 2020-08-20 (×3): qty 100

## 2020-08-20 MED ORDER — LOPERAMIDE HCL 2 MG PO CAPS
2.0000 mg | ORAL_CAPSULE | ORAL | Status: DC | PRN
  Administered 2020-08-21: 2 mg via ORAL
  Filled 2020-08-20 (×2): qty 1

## 2020-08-20 MED ORDER — SODIUM CHLORIDE 0.9 % IV SOLN
INTRAVENOUS | Status: DC | PRN
  Administered 2020-08-31: 500 mL via INTRAVENOUS
  Administered 2020-09-03 – 2020-09-11 (×2): 250 mL via INTRAVENOUS

## 2020-08-20 MED ORDER — MAGNESIUM SULFATE 2 GM/50ML IV SOLN
2.0000 g | Freq: Once | INTRAVENOUS | Status: AC
  Administered 2020-08-20: 2 g via INTRAVENOUS
  Filled 2020-08-20: qty 50

## 2020-08-20 MED ORDER — AMLODIPINE BESYLATE 5 MG PO TABS
5.0000 mg | ORAL_TABLET | Freq: Every day | ORAL | Status: DC
  Administered 2020-08-23: 5 mg via ORAL
  Filled 2020-08-20 (×3): qty 1

## 2020-08-20 NOTE — Progress Notes (Addendum)
[facility] Progress Note  Subjective: vomited up potassium pills yesterday.  Cr is slightly better today.    Still having diarrhea.    Vitals:   08/19/20 1845 08/19/20 2030 08/20/20 0436 08/20/20 0919  BP: 140/82 (!) 141/77 (!) 144/89 (!) 151/75  Pulse: 99 (!) 102 97 91  Resp: _0 Temp: 98.6 F (37 C) 98.9 F (37.2 C) 98.4 F (36.9 C) 98.9 F (37.2 C)  TempSrc: Oral Oral    SpO2: 100% 100% 100% 99%  Weight:      Height:        Exam: Gen pleasant, very obese, sitting in bed NECK: No jvd or bruits, flat neck veins  PULM: clear bilat to bases no rales or wheezing CV: RRR no MRG ABD: soft ntnd no mass or ascites +bs obese Ext no LE or UE edema Neuro is alert , nf, no asterixis, sig debilitated/ weakened    Home meds:  - norvasc 5 qd/ amoxcillin 500 tid/ norco qid prn   UA 0-5 wbc/ rbc, large Hb, hazy, prot 30, few bact UNa 31 CXR - No active disease. CT abd - No hydronephrosis or perinephric edema. Lobulated bilateral renal contours. Partially distended urinary bladder without wall thickening. US Renal - IMPRESSION: 1. Mildly increased echogenicity of both kidneys, compatible with medical renal disease. 8.3 and 8.9 cm long.  2. No hydronephrosis.   Last creat in Epic was July 2019 creat = 1.77, eGFR 36, nothing in CE   Lab today > K 2.5 ^, BUN 72, Creat 9.90 , Ca 5.7  , P 4 , alb 2.1, CO2 < 7   Assessment/ Plan: 1. Renal failure - b/l creat from 2019 was 1.77, eGFR 36.  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  Sig N/V for weeks prior to admission. Creat 10 on admit,  down to 9's--> 8.77--> 8.3. Continue Na bicarb, aggressive repletion of Ca, bicarb, K.  Appreciate palliative care- we are likely approaching a hospice situation if no improvement  She is a very poor dialysis candidate.    2. Hypokalemia - up to 2.5--> 2.8--> 3.1.  IV repletion 3. Hypocalcemia - starting Tums 2 bid, calcium gluconate prn 4. Anion and nonanion gap metabolic  acidosis- on bicarb gtt and PO as well 5. H/o CVA - R hemiparesis and aphasia 6. Chronic debility - bed to WC-bound, immobile x 2-3 weeks since she got sick 7. Anemia - Hb 8.5--> 6.6, s/p 1 u pRBCs 9/7      Madelon Lips MD 08/20/2020, 12:28 PM   Recent Labs  Lab 08/19/20 2216 08/20/20 0751  K 3.3* 3.0*  BUN 62* 62*  CREATININE 8.43* 8.33*  CALCIUM 6.7* 6.7*  PHOS 1.9* 2.9  HGB 7.6* 7.8*   Inpatient medications: . calcium carbonate  400 mg of elemental calcium Oral BID WC  . Chlorhexidine Gluconate Cloth  6 each Topical Daily  . pantoprazole  40 mg Oral Daily  . scopolamine  1 patch Transdermal Q72H  . sodium bicarbonate  1,300 mg Oral TID  . sodium chloride flush  3 mL Intravenous Q12H   . potassium chloride 10 mEq (08/20/20 1210)  . sodium bicarbonate in D5W 1000 mL infusion 150 mL/hr at 08/20/20 [id number]

## 2020-08-20 NOTE — Progress Notes (Signed)
Pt has been fatigued and pleasant this shift. Pt A&Ox4. Pt has slurred speech due to previous stroke. Pt is not able to use RUE and RLE due to stroke. Pt has general weakness. Edema present in BLE and SCDs are in place. Heart rate regular with hypertension. S1 and S2 heard. Pt on telemetry. Breath sounds diminished but clear bilaterally. Foley and Flexiseal in place. Pt reports constant nausea and has been refusing all oral medications. MD aware. Pt upgraded to full liquid diet but has poor appetite. Bowel sounds present in all four quadrants. Pt reports intermittent abdominal pain. Pt has fissure on sacrum covered with foam sacral dressing. Pt receiving sodium bicarbonate in 5% dextrose solution continuous in L hand PIV. Pt receiving potassium 10 meq/129mL in L forearm PIV. Will continue to monitor.

## 2020-08-20 NOTE — Progress Notes (Signed)
   08/20/20 1505  Clinical Encounter Type  Visited With Patient  Visit Type Follow-up (AD)  Referral From Nurse  Consult/Referral To Chaplain  Spiritual Encounters  Spiritual Needs Other (Comment) (AD Notery)  Chaplain followed up with Pt. On AD. Pt was well aware of the terms and what she was signing for AD.  2 witnesses were present and notary was complete.  2 copies were made.  Original and one copy given to PT and placed on the food tray next to her bed.  One copy placed in her chart.

## 2020-08-20 NOTE — Progress Notes (Signed)
Patient continues to refuse PO medications. MD notified and made aware. No further orders. Will continue to monitor.

## 2020-08-20 NOTE — Progress Notes (Signed)
Patient ID: Cristina Jordan, female   DOB: May 03, 1961, 59 y.o.   MRN: [id number]  This NP visited patient at the bedside as a follow up to  yesterday's [place] and to meet with daughter/[name] at the bedside for continued conversation regarding current medical situation; diagnosis, prognosis, treatment option decisions, advanced directive decisions, and anticipatory care needs.  Patient is alert and oriented and is able to participate in discussion regarding her current hospitalization and related diagnoses.  Education offered regarding end-stage renal disease.  Discussed concepts specific to the logistics of dialysis.  We discussed the impact of end-stage renal disease on electrolyte imbalances anemia of chronic disease.  Patient is open to all offered and available medical interventions to prolong life.  She verbalizes she would agree to dialysis if offered.  Education offered on the importance of advanced care planning and naming a  HPOA.   Education offered regarding DNR/DNI status understanding evidenced based poor outcomes in similar hospitalized patient, as the cause of arrest is likely associated with advanced chronic illness rather than an easily reversible acute cardio-pulmonary event.  Patient opts for DNR/DNI status and daughter supports this decision.  Advance care planning blue book left for review and spiritual care consulted to assist with creating advance care planning documents.  Discussed with patient the importance of continued conversation with her family and the medical providers regarding overall plan of care and treatment options,  ensuring decisions are within the context of the patients values and GOCs.  Questions and concerns addressed   Discussed with [place] via secure chat  PMT will continue to support holistically  Total time spent on the unit was 35 minutes Greater than 50% of the time was spent in counseling and coordination of care  Wadie Lessen NP   Palliative Medicine Team Team Phone # [phone] Pager [phone]

## 2020-08-20 NOTE — Progress Notes (Signed)
Subjective:   Cristina Jordan states that she continues to have intermittent abdominal pain that has remained constant in severity since admission. She does endorse mild nausea today although she notes her scopolamine patch was replaced. She says that she has vomited three times this morning. She says her vomit was yellow and non-bloody. She believes she still has vaginal bleeding but is unsure how much. She is unsure if her diarrhea has improved, but feels the urge to defecate. Denies any fevers, chills, swelling, CP, SOB, palpitations, light-headedness or dizziness, itching, or complications from her transfusion yesterday. She says that she has not been able to tolerate PO fluids as well this morning. She says she will have another discussion with palliative care this morning.   Objective:  Vital signs in last 24 hours: Vitals:   08/19/20 1845 08/19/20 2030 08/20/20 0436 08/20/20 0919  BP: 140/82 (!) 141/77 (!) 144/89 (!) 151/75  Pulse: 99 (!) 102 97 91  Resp: 18 18 19 20   Temp: 98.6 F (37 C) 98.9 F (37.2 C) 98.4 F (36.9 C) 98.9 F (37.2 C)  TempSrc: Oral Oral    SpO2: 100% 100% 100% 99%  Weight:      Height:       General: Patient is morbidly obese, appears less tired today. No acute distress.  Eyes: Sclera non-icteric. No conjunctival injection; however, there is conjunctival pallor.  Respiratory: Lung sounds decreased but clear to auscultation anteriorly bilaterally without wheezing, rales, rhonchi.  Cardiovascular: Borderline tachycardia present, rhythm is regular. No murmurs, rubs, or gallops. There is trace bilateral lower extremity pitting edema. SCDs in place. Abdominal: Soft with minimal, diffuse tenderness to palpation, without guarding or rebound. No obvious distention. Bowel sounds intact. Genitourinary: There is no profuse vaginal bleeding. Foley in place, draining urine.  Skin: Healed, old midline scar on abdomen. No rashes noted.    Assessment/Plan:  Active  Problems:   Rhabdomyolysis   Hypokalemia   Hypocalcemia   Acute renal failure (HCC)   Malnutrition of moderate degree   Palliative care by specialist   DNR (do not resuscitate) discussion   Lethargy   DNR (do not resuscitate)   Weakness generalized  # Colitis Patient presented with ABD pain, N/V/D, decreased PO intake x 1-2 weeks. CT abdomen pelvis suggestive of diffuse colonic wall thickening with pericolonic edema involving the descending/sigmoid colon suspicious for colitis. Initial WBC 15.1 --> 12.3 , LA WNL.  - C. Diff and GI panel negative, very likely inflammatory  - Started Imodium PRN  - Patient may require prednisone, though will hold and see how bowel rest improves symptoms. Flexi-seal in place. - Switched patient from clear liquid diet to full liquid diet in hopes she may tolerate Boost Breeze TID that nutrition recommends - Started Protonix 40mg  daily  - Continue scopolamine for nausea. Will repeat EKG to see if QTc prolongation has improved enough for additional medication trials.  # Mild Rhabdomyolysis CK 1266, U/A large hemoglobin, no RBC's. 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  - Continue IVF   # Severe AKI with Metabolic Acidosis  Creatinine on arrival was 10.23 (up from 1.7 in 2019), BUN 73. Improving slowly to 8.33 and 62, respectively. GFR 4 --> 6, previous 36 in 2019. Likely prerenal due to prolonged poor PO intake and diarrhea. Renal Ultrasound showed increased echogenicity consistent with medical renal disease. Bicarb stable at 14, although now has anion gap of 17.  -  Nephrology on board, appreciate their recommendations  - Patient is a poor dialysis candidate and may require hospice if renal function does not improve. Patient made DNR following discussion with Palliative Care team.  - Will continue goals of care discussions.  - Continue sodium bicarbonate 19mEq, 5%  dextrose at 142mL/hr, now also replaing orally; BG stable but continuing monitoring - Strict I&O - urine output increased to -1.1L today  # Electrolyte Abnormalities with Moderate Malnutrition Patient has only been consuming broth for at least 2 weeks at home with N/V/D. Nutrition endorses 22% weight loss over past 3 months. Patient was able to receive 2nd peripheral line in LUE. May consider midline if patient demonstrates need for this. Hypokalemia < 2 on arrival: 3.3 today, unable to tolerate PO KCl today, resumed IV replacement only Hypocalcemia - corrected to 6.8 on arrival: corrects to 8.4 today, continue TUMS / calcium gluconate IV as needed Hypomagnesemia - Initially 1.4: 1.8 today Hypophosphatemia, resolved - improved from 1.9 to 2.9 - Hypokalemia likely due to continued diarrhea; will start Imodium PRN and add Boost - Continue cardiac monitoring - Appreciate nephrology's assistance in electrolyte replacements  # Stable Normocytic Anemia  Initial Hgb 8.3 --> 6.6, improved to 7.8 today s/p single RBC transfusion 08/21/20. Iron panel WNL except for high TIBC, although ferritin 86 may be falsely elevated. Likely due to worsening renal disease, although patient also has vaginal bleeding during admission only (10 years post-menopausal without regular OB-GYN follow-up). - Gave 1 RBC transfusion  - Will recheck CBC and monitor daily CBC's / bleeding - Will hold off on iron for now given GI inflammation though will likely require this vs. EPO in future - On SCDs for DVT PPx - will need OP pap smear, TVUS if patient does not go to hospice  # Hypertension  Blood pressures continue to increase slightly up to 818'H systolic today. - Will resume home amlodipine 5mg  and continue to monitor   Prior to Admission Living Arrangement: Home  Anticipated Discharge Location: SNF vs. Hospice  Barriers to Discharge: Electrolyte instability, Poor renal function, Active Colitis  Code Status: DNR/DNI Diet:  full Liquid diet IVF: NaHCO3 155mEq, D5 179mL/hr DVT PPx: SCD's given vaginal bleeding - although may resume medicinal anticoagulation if vaginal bleeding resolves  Jeralyn Bennett, MD 08/20/2020, 1:42 PM Pager: [phone] After 5pm on weekdays and 1pm on weekends: On Call pager [phone]

## 2020-08-21 DIAGNOSIS — R197 Diarrhea, unspecified: Secondary | ICD-10-CM

## 2020-08-21 DIAGNOSIS — R933 Abnormal findings on diagnostic imaging of other parts of digestive tract: Secondary | ICD-10-CM

## 2020-08-21 DIAGNOSIS — R112 Nausea with vomiting, unspecified: Secondary | ICD-10-CM

## 2020-08-21 LAB — GLUCOSE, CAPILLARY
Glucose-Capillary: 102 mg/dL — ABNORMAL HIGH (ref 70–99)
Glucose-Capillary: 107 mg/dL — ABNORMAL HIGH (ref 70–99)
Glucose-Capillary: 110 mg/dL — ABNORMAL HIGH (ref 70–99)
Glucose-Capillary: 110 mg/dL — ABNORMAL HIGH (ref 70–99)

## 2020-08-21 LAB — CBC
HCT: 23 % — ABNORMAL LOW (ref 36.0–46.0)
Hemoglobin: 7.7 g/dL — ABNORMAL LOW (ref 12.0–15.0)
MCH: 28.6 pg (ref 26.0–34.0)
MCHC: 33.5 g/dL (ref 30.0–36.0)
MCV: 85.5 fL (ref 80.0–100.0)
Platelets: 201 10*3/uL (ref 150–400)
RBC: 2.69 MIL/uL — ABNORMAL LOW (ref 3.87–5.11)
RDW: 20.6 % — ABNORMAL HIGH (ref 11.5–15.5)
WBC: 11.1 10*3/uL — ABNORMAL HIGH (ref 4.0–10.5)
nRBC: 0.4 % — ABNORMAL HIGH (ref 0.0–0.2)

## 2020-08-21 LAB — RENAL FUNCTION PANEL
Albumin: 1.9 g/dL — ABNORMAL LOW (ref 3.5–5.0)
Albumin: 2 g/dL — ABNORMAL LOW (ref 3.5–5.0)
Albumin: 1.8 g/dL — ABNORMAL LOW (ref 3.5–5.0)
Anion gap: 14 (ref 5–15)
Anion gap: 15 (ref 5–15)
Anion gap: 15 (ref 5–15)
BUN: 58 mg/dL — ABNORMAL HIGH (ref 6–20)
BUN: 57 mg/dL — ABNORMAL HIGH (ref 6–20)
BUN: 57 mg/dL — ABNORMAL HIGH (ref 6–20)
CO2: 20 mmol/L — ABNORMAL LOW (ref 22–32)
CO2: 22 mmol/L (ref 22–32)
CO2: 24 mmol/L (ref 22–32)
Calcium: 6.6 mg/dL — ABNORMAL LOW (ref 8.9–10.3)
Calcium: 6.9 mg/dL — ABNORMAL LOW (ref 8.9–10.3)
Calcium: 6.6 mg/dL — ABNORMAL LOW (ref 8.9–10.3)
Chloride: 101 mmol/L (ref 98–111)
Chloride: 99 mmol/L (ref 98–111)
Chloride: 98 mmol/L (ref 98–111)
Creatinine, Ser: 7.65 mg/dL — ABNORMAL HIGH (ref 0.44–1.00)
Creatinine, Ser: 7.6 mg/dL — ABNORMAL HIGH (ref 0.44–1.00)
Creatinine, Ser: 7.54 mg/dL — ABNORMAL HIGH (ref 0.44–1.00)
GFR calc Af Amer: 6 mL/min — ABNORMAL LOW (ref >60)
GFR calc Af Amer: 6 mL/min — ABNORMAL LOW (ref >60)
GFR calc Af Amer: 6 mL/min — ABNORMAL LOW (ref >60)
GFR calc non Af Amer: 5 mL/min — ABNORMAL LOW (ref >60)
GFR calc non Af Amer: 5 mL/min — ABNORMAL LOW (ref >60)
GFR calc non Af Amer: 5 mL/min — ABNORMAL LOW (ref >60)
Glucose, Bld: 119 mg/dL — ABNORMAL HIGH (ref 70–99)
Glucose, Bld: 116 mg/dL — ABNORMAL HIGH (ref 70–99)
Glucose, Bld: 119 mg/dL — ABNORMAL HIGH (ref 70–99)
Phosphorus: 2.5 mg/dL (ref 2.5–4.6)
Phosphorus: 2.6 mg/dL (ref 2.5–4.6)
Phosphorus: 2.6 mg/dL (ref 2.5–4.6)
Potassium: 3.2 mmol/L — ABNORMAL LOW (ref 3.5–5.1)
Potassium: 3.2 mmol/L — ABNORMAL LOW (ref 3.5–5.1)
Potassium: 3.2 mmol/L — ABNORMAL LOW (ref 3.5–5.1)
Sodium: 135 mmol/L (ref 135–145)
Sodium: 136 mmol/L (ref 135–145)
Sodium: 137 mmol/L (ref 135–145)

## 2020-08-21 LAB — MAGNESIUM
Magnesium: 1.6 mg/dL — ABNORMAL LOW (ref 1.7–2.4)
Magnesium: 2 mg/dL (ref 1.7–2.4)

## 2020-08-21 MED ORDER — CALCIUM GLUCONATE-NACL 2-0.675 GM/100ML-% IV SOLN
2.0000 g | Freq: Once | INTRAVENOUS | Status: AC
  Administered 2020-08-21: 2000 mg via INTRAVENOUS
  Filled 2020-08-21: qty 100

## 2020-08-21 MED ORDER — POTASSIUM CHLORIDE 10 MEQ/100ML IV SOLN
10.0000 meq | INTRAVENOUS | Status: AC
  Administered 2020-08-22 (×3): 10 meq via INTRAVENOUS
  Filled 2020-08-21: qty 100

## 2020-08-21 MED ORDER — MAGNESIUM SULFATE 2 GM/50ML IV SOLN
2.0000 g | Freq: Once | INTRAVENOUS | Status: AC
  Administered 2020-08-21: 2 g via INTRAVENOUS
  Filled 2020-08-21: qty 50

## 2020-08-21 MED ORDER — POTASSIUM CHLORIDE 10 MEQ/100ML IV SOLN
10.0000 meq | INTRAVENOUS | Status: AC
  Administered 2020-08-21 (×3): 10 meq via INTRAVENOUS
  Filled 2020-08-21 (×3): qty 100

## 2020-08-21 MED ORDER — PROMETHAZINE HCL 25 MG/ML IJ SOLN
6.2500 mg | Freq: Once | INTRAMUSCULAR | Status: AC
  Administered 2020-08-21: 6.25 mg via INTRAVENOUS
  Filled 2020-08-21: qty 1

## 2020-08-21 MED ORDER — LABETALOL HCL 5 MG/ML IV SOLN
10.0000 mg | Freq: Two times a day (BID) | INTRAVENOUS | Status: DC
  Administered 2020-08-21: 10 mg via INTRAVENOUS
  Filled 2020-08-21: qty 4

## 2020-08-21 NOTE — Consult Note (Signed)
[facility] Consult: 11:41 AM 08/21/2020  LOS: 5 days    Referring Provider: Dr Heber [place]  Primary Care Physician:  None, uses ED for med issues.   Primary Gastroenterologist:  unassigned    Reason for Consultation:  N/V. Diarrhea, abd pain.   HPI: Cristina Jordan is a 59 y.o. female.  PMH Morbid obesity.  CVA w R hemiparesis, aphasia, facial droop.  At baseline is bed to wheelchair bound.  [place] Anemia, Hgb 9.9 in 06/2018.  Sigmoid diverticulosis, ventral hernias, uterine fibroids per CT wo contrast in 06/2018.  S/p cholecystectomy, complicated by surgical sponge left in place requiring laparotomy (at [facility] in [place]).  No previous colonoscopy or EGD.    All remaining, total # 22,teeth extracted 3 d PTA.  Rx Amoxicillin afterwards.   For 2 weeks PTA had nausea, yellow bilious emesis, watery brown diarrhea ~ 10 x day, bil lower abdominal pain.  Mostly consuming broth for ~ 2 weeks.   Admitted w worse than baseline weakness,  Ruled in for mild rhabdo, AKI, met acidosis, electrolyte derangements, hyperglycemia.   Hgb 8.5.  Normal MCV.  Normal LFTS at arrival.    Also noted: malnutrition, anemia Hgb low of 6.6.  Normal MCV.   C/o new N/V, loose stools, abd pain.  No blood po or pr CTAP w/o contrast: 1. S/O diffuse colonic wall thickening with pericolonic edema involving the descending and sigmoid colon, suspicious for colitis. This may be infectious or inflammatory. 2. Distal colonic diverticulosis without focal diverticulitis. 3. Right paramidline ventral abdominal wall hernia contains short segment of small bowel without obstruction or inflammatory change.  Left paramidline ventral abdominal wall hernia at the same level contains only fat.  Pt now has flexiseal in place, watery brown stool has not been  measured.   Vomit yellow clear liquid w po intake.        Past Medical History:  Diagnosis Date  . Hypertension   . Stroke [facility])     Past Surgical History:  Procedure Laterality Date  . CHOLECYSTECTOMY      Prior to Admission medications   Medication Sig Start Date End Date Taking? Authorizing Provider  amoxicillin (AMOXIL) 500 MG capsule Take 500 mg by mouth 3 (three) times daily. 08/12/20  Yes [provider]  HYDROcodone-acetaminophen (NORCO) 10-325 MG tablet Take 1 tablet by mouth every 6 (six) hours as needed (pain).  08/12/20  Yes [provider]  OVER THE COUNTER MEDICATION Take 2 tablets by mouth daily as needed (pain). "tylenol migraine"   Yes [provider]  amLODipine (NORVASC) 5 MG tablet Take 1 tablet (5 mg total) by mouth daily. Patient not taking: Reported on 05/27/2020 06/14/18   [name], MD    Scheduled Meds: . amLODipine  5 mg Oral Daily  . calcium carbonate  400 mg of elemental calcium Oral BID WC  . Chlorhexidine Gluconate Cloth  6 each Topical Daily  . pantoprazole  40 mg Oral Daily  . scopolamine  1 patch Transdermal Q72H  . sodium bicarbonate  1,300 mg Oral  TID  . sodium chloride flush  3 mL Intravenous Q12H   Infusions: . sodium chloride    . sodium bicarbonate in D5W 1000 mL infusion 150 mL/hr at 08/21/20 1129   PRN Meds: sodium chloride, acetaminophen, loperamide   Allergies as of 08/19/2020  . (No Known Allergies)    History reviewed. No pertinent family history.  Social History   Socioeconomic History  . Marital status: Divorced    Spouse name: Not on file  . Number of children: Not on file  . Years of education: Not on file  . Highest education level: Not on file  Occupational History  . Not on file  Tobacco Use  . Smoking status: Never Smoker  . Smokeless tobacco: Never Used  Substance and Sexual Activity  . Alcohol use: Not Currently  . Drug use: Never  . Sexual activity: Not Currently   Other Topics Concern  . Not on file  Social History Narrative  . Not on file   Social Determinants of Health   Financial Resource Strain:   . Difficulty of Paying Living Expenses: Not on file  Food Insecurity:   . Worried About Charity fundraiser in the Last Year: Not on file  . Ran Out of Food in the Last Year: Not on file  Transportation Needs:   . Lack of Transportation (Medical): Not on file  . Lack of Transportation (Non-Medical): Not on file  Physical Activity:   . Days of Exercise per Week: Not on file  . Minutes of Exercise per Session: Not on file  Stress:   . Feeling of Stress : Not on file  Social Connections:   . Frequency of Communication with Friends and Family: Not on file  . Frequency of Social Gatherings with Friends and Family: Not on file  . Attends Religious Services: Not on file  . Active Member of Clubs or Organizations: Not on file  . Attends Archivist Meetings: Not on file  . Marital Status: Not on file  Intimate Partner Violence:   . Fear of Current or Ex-Partner: Not on file  . Emotionally Abused: Not on file  . Physically Abused: Not on file  . Sexually Abused: Not on file    REVIEW OF SYSTEMS: Constitutional:  See HPI ENT:  No nose bleeds Pulm:  No SOB, no cough CV:  No palpitations, no LE edema.  GU:  No hematuria, no frequency GI:  See HPI. Heme:  No excessive or unusual bleeding or brusing   Transfusions:  1 PRBC this admission Neuro:  No headaches, no peripheral tingling or numbness Derm:  No itching, no rash or sores.  Endocrine:  No sweats or chills.  No polyuria or dysuria Immunization:  Not queried Travel:  None beyond local counties in last few months.    PHYSICAL EXAM: Vital signs in last 24 hours: Vitals:   08/21/20 0437 08/21/20 0903  BP: (!) 173/75 (!) 156/109  Pulse: 98 (!) 110  Resp: 15 18  Temp: 98.1 F (36.7 C) 98.7 F (37.1 C)  SpO2: 98% 99%   Wt Readings from Last 3 Encounters:  08/18/20 (!)  137.9 kg  05/27/20 (!) 176.9 kg  06/13/18 (!) 178.3 kg    General: Morbidly obese, comfortable, alert, aphasic.  Does not look as bad as chart would suggest.  Does not appear acutely ill. Head: No signs of head trauma. Eyes: No conjunctival pallor.  No scleral icterus. Ears:  Not HOH.    Nose:  No congestion or discharge Mouth:  Mucosa moist and pink. No teeth.   Neck:  No jvd, no masses Lungs:  No labored breathing.  No cough. Heart: RRR.  No MRG.  S1, S2 present. Abdomen: Obese.  Soft.  Surgical scars around the umbilicus and at midline above the umbilicus are all well-healed.  Not able to appreciate hernias and multiple skin folds.  Minimal bilateral lower abdominal tenderness.  Bowel sounds active.   Rectal: Flexi-Seal in place.  Watery, brown, nonbloody stool ~250 mL in bag. GU: ~150 mL pale yellow urine in catheter bag. Musc/Skeltl: No gross joint deformities. Extremities: No CCE. Neurologic: Right hemiparesis.  Unable to move her right arm.  No tremors.  Fully alert and appropriate.  Able to provide good history despite her delayed speech/aphasia. Skin: No rash, no sores, no suspicious lesions, no significant purpura or bruising.  However did not turn patient over to examine her backside. Nodes: No cervical adenopathy Psych: Cooperative, pleasant, calm.  Appropriate.  Intake/Output from previous day: 09/08 0701 - 09/09 0700 In: 5266.7 [P.O.:480; I.V.:3642.6; IV Piggyback:1144.1] Out: 1675 [Urine:1675] Intake/Output this shift: Total I/O In: 303.3 [I.V.:298.1; IV Piggyback:5.2] Out: 250 [Urine:250]  LAB RESULTS: Recent Labs    08/19/20 0656 08/19/20 0656 08/19/20 2216 08/20/20 0751 08/21/20 0433  WBC 9.4  --   --  12.3* 11.1*  HGB 6.6*   < > 7.6* 7.8* 7.7*  HCT 18.9*   < > 22.0* 22.7* 23.0*  PLT 215  --   --  186 201   < > = values in this interval not displayed.   BMET Lab Results  Component Value Date   NA 135 08/21/2020   NA 135 08/20/2020   NA 136  08/20/2020   K 3.2 (L) 08/21/2020   K 3.0 (L) 08/20/2020   K 3.2 (L) 08/20/2020   CL 101 08/21/2020   CL 101 08/20/2020   CL 104 08/20/2020   CO2 20 (L) 08/21/2020   CO2 19 (L) 08/20/2020   CO2 17 (L) 08/20/2020   GLUCOSE 119 (H) 08/21/2020   GLUCOSE 113 (H) 08/20/2020   GLUCOSE 113 (H) 08/20/2020   BUN 58 (H) 08/21/2020   BUN 61 (H) 08/20/2020   BUN 60 (H) 08/20/2020   CREATININE 7.65 (H) 08/21/2020   CREATININE 7.81 (H) 08/20/2020   CREATININE 8.09 (H) 08/20/2020   CALCIUM 6.6 (L) 08/21/2020   CALCIUM 6.4 (LL) 08/20/2020   CALCIUM 6.6 (L) 08/20/2020   LFT Recent Labs    08/20/20 1413 08/20/20 2023 08/21/20 0433  ALBUMIN 1.8* 1.9* 1.9*   PT/INR No results found for: INR, PROTIME Hepatitis Panel No results for input(s): HEPBSAG, HCVAB, HEPAIGM, HEPBIGM in the last 72 hours. C-Diff No components found for: CDIFF Lipase     Component Value Date/Time   LIPASE 263 (H) 08/17/2020 0505    Drugs of Abuse  No results found for: LABOPIA, COCAINSCRNUR, LABBENZ, AMPHETMU, THCU, LABBARB   RADIOLOGY STUDIES: No results found.    IMPRESSION:   *  Colitis w N/V, diarrhea, abd pain.  This began 2 weeks prior to arrival. ? Ischemic vs infectious.   Cdiff negative, GI path PCR panel negative.    *   AKI on CKD.  Per renal: " very poor dialysis candidate.... likely approaching a hospice situation if no improvement"    *   Baseline poor functional status pst CVA w R hemiparesis, aphasia, facial droop.  At baseline is bed to wheelchair bound.  *   Recent  dental extractions and amoxicillin.   *   Acute on chronic  anemia.  Iron 64, ferritin  86.  Low TIBC.  No folate or B12 levels obtained.   Hgb 6.6 >> 1 PRBC >> 7.7.    *   Hypokalemia.    *   Hyperglycemia.   ? previously undiagnosed DM2 though A1cis 4.6 which is normal.      PLAN:     *   Per Dr Rush [place].   Not a good candidate for colonoscopy but flexible sig may be feasible.    *   Ordered folate and  B12, TSH level for tomorrow morning.   Azucena Freed  08/21/2020, 11:41 AM Phone [phone]

## 2020-08-21 NOTE — H&P (View-Only) (Signed)
[facility] Consult: 11:41 AM 08/21/2020  LOS: 5 days    Referring Provider: Dr Heber [place]  Primary Care Physician:  None, uses ED for med issues.   Primary Gastroenterologist:  unassigned    Reason for Consultation:  N/V. Diarrhea, abd pain.   HPI: Cristina Jordan is a 59 y.o. female.  PMH Morbid obesity.  CVA w R hemiparesis, aphasia, facial droop.  At baseline is bed to wheelchair bound.  [place] Anemia, Hgb 9.9 in 06/2018.  Sigmoid diverticulosis, ventral hernias, uterine fibroids per CT wo contrast in 06/2018.  S/p cholecystectomy, complicated by surgical sponge left in place requiring laparotomy (at [facility] in [place]).  No previous colonoscopy or EGD.    All remaining, total # 22,teeth extracted 3 d PTA.  Rx Amoxicillin afterwards.   For 2 weeks PTA had nausea, yellow bilious emesis, watery brown diarrhea ~ 10 x day, bil lower abdominal pain.  Mostly consuming broth for ~ 2 weeks.   Admitted w worse than baseline weakness,  Ruled in for mild rhabdo, AKI, met acidosis, electrolyte derangements, hyperglycemia.   Hgb 8.5.  Normal MCV.  Normal LFTS at arrival.    Also noted: malnutrition, anemia Hgb low of 6.6.  Normal MCV.   C/o new N/V, loose stools, abd pain.  No blood po or pr CTAP w/o contrast: 1. S/O diffuse colonic wall thickening with pericolonic edema involving the descending and sigmoid colon, suspicious for colitis. This may be infectious or inflammatory. 2. Distal colonic diverticulosis without focal diverticulitis. 3. Right paramidline ventral abdominal wall hernia contains short segment of small bowel without obstruction or inflammatory change.  Left paramidline ventral abdominal wall hernia at the same level contains only fat.  Pt now has flexiseal in place, watery brown stool has not been  measured.   Vomit yellow clear liquid w po intake.        Past Medical History:  Diagnosis Date  . Hypertension   . Stroke [facility])     Past Surgical History:  Procedure Laterality Date  . CHOLECYSTECTOMY      Prior to Admission medications   Medication Sig Start Date End Date Taking? Authorizing Provider  amoxicillin (AMOXIL) 500 MG capsule Take 500 mg by mouth 3 (three) times daily. 08/12/20  Yes [provider]  HYDROcodone-acetaminophen (NORCO) 10-325 MG tablet Take 1 tablet by mouth every 6 (six) hours as needed (pain).  08/12/20  Yes [provider]  OVER THE COUNTER MEDICATION Take 2 tablets by mouth daily as needed (pain). "tylenol migraine"   Yes [provider]  amLODipine (NORVASC) 5 MG tablet Take 1 tablet (5 mg total) by mouth daily. Patient not taking: Reported on 05/27/2020 06/14/18   [name], MD    Scheduled Meds: . amLODipine  5 mg Oral Daily  . calcium carbonate  400 mg of elemental calcium Oral BID WC  . Chlorhexidine Gluconate Cloth  6 each Topical Daily  . pantoprazole  40 mg Oral Daily  . scopolamine  1 patch Transdermal Q72H  . sodium bicarbonate  1,300 mg Oral  TID  . sodium chloride flush  3 mL Intravenous Q12H   Infusions: . sodium chloride    . sodium bicarbonate in D5W 1000 mL infusion 150 mL/hr at 08/21/20 1129   PRN Meds: sodium chloride, acetaminophen, loperamide   Allergies as of 08/21/2020  . (No Known Allergies)    History reviewed. No pertinent family history.  Social History   Socioeconomic History  . Marital status: Divorced    Spouse name: Not on file  . Number of children: Not on file  . Years of education: Not on file  . Highest education level: Not on file  Occupational History  . Not on file  Tobacco Use  . Smoking status: Never Smoker  . Smokeless tobacco: Never Used  Substance and Sexual Activity  . Alcohol use: Not Currently  . Drug use: Never  . Sexual activity: Not Currently   Other Topics Concern  . Not on file  Social History Narrative  . Not on file   Social Determinants of Health   Financial Resource Strain:   . Difficulty of Paying Living Expenses: Not on file  Food Insecurity:   . Worried About Charity fundraiser in the Last Year: Not on file  . Ran Out of Food in the Last Year: Not on file  Transportation Needs:   . Lack of Transportation (Medical): Not on file  . Lack of Transportation (Non-Medical): Not on file  Physical Activity:   . Days of Exercise per Week: Not on file  . Minutes of Exercise per Session: Not on file  Stress:   . Feeling of Stress : Not on file  Social Connections:   . Frequency of Communication with Friends and Family: Not on file  . Frequency of Social Gatherings with Friends and Family: Not on file  . Attends Religious Services: Not on file  . Active Member of Clubs or Organizations: Not on file  . Attends Archivist Meetings: Not on file  . Marital Status: Not on file  Intimate Partner Violence:   . Fear of Current or Ex-Partner: Not on file  . Emotionally Abused: Not on file  . Physically Abused: Not on file  . Sexually Abused: Not on file    REVIEW OF SYSTEMS: Constitutional:  See HPI ENT:  No nose bleeds Pulm:  No SOB, no cough CV:  No palpitations, no LE edema.  GU:  No hematuria, no frequency GI:  See HPI. Heme:  No excessive or unusual bleeding or brusing   Transfusions:  1 PRBC this admission Neuro:  No headaches, no peripheral tingling or numbness Derm:  No itching, no rash or sores.  Endocrine:  No sweats or chills.  No polyuria or dysuria Immunization:  Not queried Travel:  None beyond local counties in last few months.    PHYSICAL EXAM: Vital signs in last 24 hours: Vitals:   08/21/20 0437 08/21/20 0903  BP: (!) 173/75 (!) 156/109  Pulse: 98 (!) 110  Resp: 15 18  Temp: 98.1 F (36.7 C) 98.7 F (37.1 C)  SpO2: 98% 99%   Wt Readings from Last 3 Encounters:  08/18/20 (!)  137.9 kg  05/27/20 (!) 176.9 kg  06/13/18 (!) 178.3 kg    General: Morbidly obese, comfortable, alert, aphasic.  Does not look as bad as chart would suggest.  Does not appear acutely ill. Head: No signs of head trauma. Eyes: No conjunctival pallor.  No scleral icterus. Ears:  Not HOH.    Nose:  No congestion or discharge Mouth:  Mucosa moist and pink. No teeth.   Neck:  No jvd, no masses Lungs:  No labored breathing.  No cough. Heart: RRR.  No MRG.  S1, S2 present. Abdomen: Obese.  Soft.  Surgical scars around the umbilicus and at midline above the umbilicus are all well-healed.  Not able to appreciate hernias and multiple skin folds.  Minimal bilateral lower abdominal tenderness.  Bowel sounds active.   Rectal: Flexi-Seal in place.  Watery, brown, nonbloody stool ~250 mL in bag. GU: ~150 mL pale yellow urine in catheter bag. Musc/Skeltl: No gross joint deformities. Extremities: No CCE. Neurologic: Right hemiparesis.  Unable to move her right arm.  No tremors.  Fully alert and appropriate.  Able to provide good history despite her delayed speech/aphasia. Skin: No rash, no sores, no suspicious lesions, no significant purpura or bruising.  However did not turn patient over to examine her backside. Nodes: No cervical adenopathy Psych: Cooperative, pleasant, calm.  Appropriate.  Intake/Output from previous day: 09/08 0701 - 09/09 0700 In: 5266.7 [P.O.:480; I.V.:3642.6; IV Piggyback:1144.1] Out: 1675 [Urine:1675] Intake/Output this shift: Total I/O In: 303.3 [I.V.:298.1; IV Piggyback:5.2] Out: 250 [Urine:250]  LAB RESULTS: Recent Labs    08/19/20 0656 08/19/20 0656 08/19/20 2216 08/20/20 0751 08/21/20 0433  WBC 9.4  --   --  12.3* 11.1*  HGB 6.6*   < > 7.6* 7.8* 7.7*  HCT 18.9*   < > 22.0* 22.7* 23.0*  PLT 215  --   --  186 201   < > = values in this interval not displayed.   BMET Lab Results  Component Value Date   NA 135 08/21/2020   NA 135 08/20/2020   NA 136  08/20/2020   K 3.2 (L) 08/21/2020   K 3.0 (L) 08/20/2020   K 3.2 (L) 08/20/2020   CL 101 08/21/2020   CL 101 08/20/2020   CL 104 08/20/2020   CO2 20 (L) 08/21/2020   CO2 19 (L) 08/20/2020   CO2 17 (L) 08/20/2020   GLUCOSE 119 (H) 08/21/2020   GLUCOSE 113 (H) 08/20/2020   GLUCOSE 113 (H) 08/20/2020   BUN 58 (H) 08/21/2020   BUN 61 (H) 08/20/2020   BUN 60 (H) 08/20/2020   CREATININE 7.65 (H) 08/21/2020   CREATININE 7.81 (H) 08/20/2020   CREATININE 8.09 (H) 08/20/2020   CALCIUM 6.6 (L) 08/21/2020   CALCIUM 6.4 (LL) 08/20/2020   CALCIUM 6.6 (L) 08/20/2020   LFT Recent Labs    08/20/20 1413 08/20/20 2023 08/21/20 0433  ALBUMIN 1.8* 1.9* 1.9*   PT/INR No results found for: INR, PROTIME Hepatitis Panel No results for input(s): HEPBSAG, HCVAB, HEPAIGM, HEPBIGM in the last 72 hours. C-Diff No components found for: CDIFF Lipase     Component Value Date/Time   LIPASE 263 (H) 08/17/2020 0505    Drugs of Abuse  No results found for: LABOPIA, COCAINSCRNUR, LABBENZ, AMPHETMU, THCU, LABBARB   RADIOLOGY STUDIES: No results found.    IMPRESSION:   *  Colitis w N/V, diarrhea, abd pain.  This began 2 weeks prior to arrival. ? Ischemic vs infectious.   Cdiff negative, GI path PCR panel negative.    *   AKI on CKD.  Per renal: " very poor dialysis candidate.... likely approaching a hospice situation if no improvement"    *   Baseline poor functional status pst CVA w R hemiparesis, aphasia, facial droop.  At baseline is bed to wheelchair bound.  *   Recent  dental extractions and amoxicillin.   *   Acute on chronic [place] anemia.  Iron 64, ferritin  86.  Low TIBC.  No folate or B12 levels obtained.   Hgb 6.6 >> 1 PRBC >> 7.7.    *   Hypokalemia.    *   Hyperglycemia.   ? previously undiagnosed DM2 though A1cis 4.6 which is normal.      PLAN:     *   Per Dr Rush Landmark.   Not a good candidate for colonoscopy but flexible sig may be feasible.    *   Ordered folate and  B12, TSH level for tomorrow morning.   Azucena Freed  08/21/2020, 11:41 AM Phone [phone]

## 2020-08-21 NOTE — Progress Notes (Signed)
[facility] Progress Note  Subjective: CR and electrolytes continue to improve. Diarrhea still ongoing.   Vitals:   08/20/20 1626 08/20/20 2047 08/21/20 0437 08/21/20 0903  BP: 137/90 (!) 148/89 (!) 173/75 (!) 156/109  Pulse: 98 97 98 (!) 110  Resp: _0 Temp: 97.8 F (36.6 C) 98 F (36.7 C) 98.1 F (36.7 C) 98.7 F (37.1 C)  TempSrc: Oral  Oral Oral  SpO2: 100% 99% 98% 99%  Weight:      Height:        Exam: Gen pleasant, very obese, sitting in bed NECK: No jvd or bruits, flat neck veins  PULM: clear bilat to bases no rales or wheezing CV: RRR no MRG ABD: soft ntnd no mass or ascites +bs obese Ext no LE or UE edema Neuro is alert , nf, no asterixis, sig debilitated/ weakened    Home meds:  - norvasc 5 qd/ amoxcillin 500 tid/ norco qid prn   UA 0-5 wbc/ rbc, large Hb, hazy, prot 30, few bact UNa 31 CXR - No active disease. CT abd - No hydronephrosis or perinephric edema. Lobulated bilateral renal contours. Partially distended urinary bladder without wall thickening. US Renal - IMPRESSION: 1. Mildly increased echogenicity of both kidneys, compatible with medical renal disease. 8.3 and 8.9 cm long.  2. No hydronephrosis.   Last creat in Epic was July 2019 creat = 1.77, eGFR 36, nothing in CE   Lab today > K 2.5 ^, BUN 72, Creat 9.90 , Ca 5.7  , P 4 , alb 2.1, CO2 < 7   Assessment/ Plan: 1. Renal failure - b/l creat from 2019 was 1.77, eGFR 36.  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  Sig N/V for weeks prior to admission. Creat 10 on admit,  down to 9's--> 8.77--> 8.3--> 7.3. Continue Na bicarb, aggressive repletion of Ca, bicarb, K.  Appreciate palliative care.  She is a very poor dialysis candidate but fortunately is improving.    2. Hypokalemia - up to 2.5--> 2.8--> 3.1.  IV repletion 3. Hypocalcemia -Tums 2 bid, calcium gluconate prn 4. Anion and nonanion gap metabolic acidosis- on bicarb gtt and PO as well 5. H/o CVA - R  hemiparesis and aphasia 6. Chronic debility - bed to WC-bound, immobile x 2-3 weeks since she got sick 7. Anemia - Hb 8.5--> 6.6, s/p 1 u pRBCs 9/7      Madelon Lips MD 08/21/2020, 12:01 PM   Recent Labs  Lab 08/20/20 0751 08/20/20 1413 08/20/20 2023 08/21/20 0433  K 3.0*   < > 3.0* 3.2*  BUN 62*   < > 61* 58*  CREATININE 8.33*   < > 7.81* 7.65*  CALCIUM 6.7*   < > 6.4* 6.6*  PHOS 2.9   < > 2.5 2.5  HGB 7.8*  --   --  7.7*   < > = values in this interval not displayed.   Inpatient medications: . amLODipine  5 mg Oral Daily  . calcium carbonate  400 mg of elemental calcium Oral BID WC  . Chlorhexidine Gluconate Cloth  6 each Topical Daily  . pantoprazole  40 mg Oral Daily  . scopolamine  1 patch Transdermal Q72H  . sodium bicarbonate  1,300 mg Oral TID  . sodium chloride flush  3 mL Intravenous Q12H   . sodium chloride    . sodium bicarbonate in D5W 1000 mL infusion 150 mL/hr at 08/21/20 1129

## 2020-08-21 NOTE — Progress Notes (Signed)
Patient continues to refuse PO medications despite giving phenergan IV. Patient educated on the importance of taking her medications, but still refusing D/T complaints of N/V. MD notified and made aware. MD stated to get a repeat set of vital signs. Orders followed. Awaiting further orders.

## 2020-08-21 NOTE — Progress Notes (Signed)
Subjective:   Ms. Maietta states that she continues to have intermittent, diffuse abdominal pain that has remained constant in severity since admission. She says her nausea continues to worsen and states that she had one episode of non-bloody vomit this morning. She says she has not been able to sip water without gagging and states that she cannot taste anything currently. She states she continues to have diarrhea and corrects that her diarrhea started one day before she was admitted. She denies oral pain although states her teeth were removed due to poor oral care after her stroke. She notes she did take amoxicillin for this prior to admission. Her son states that following her stroke, she was supposed to undergo rehabilitation, but went home instead to be with her daughter and grand-daughters (ages 76 and 30) because she wanted to help care for them. Her son in the room is concerned that patient does not care for her own health at home. She states that she has not received the COVID 19 vaccine.   Objective:  Vital signs in last 24 hours: Vitals:   08/20/20 1626 08/20/20 2047 08/21/20 0437 08/21/20 0903  BP: 137/90 (!) 148/89 (!) 173/75 (!) 156/109  Pulse: 98 97 98 (!) 110  Resp: 18 18 15 18   Temp: 97.8 F (36.6 C) 98 F (36.7 C) 98.1 F (36.7 C) 98.7 F (37.1 C)  TempSrc: Oral  Oral Oral  SpO2: 100% 99% 98% 99%  Weight:      Height:       General: Patient is morbidly obese. Patient appears nauseas but otherwise is in NAD.  Eyes: Sclera non-icteric. No conjunctival injection. Respiratory: Lung sounds decreased but clear to auscultation anteriorly bilaterally without wheezing, rales, rhonchi.  Cardiovascular: Rate is tachycardic. rhythm is regular. No murmurs, rubs, or gallops. There is trace bilateral lower extremity pitting edema. SCDs in place. Neurological: There is paralysis of her RUE. Abdominal: Soft with mild lower abdominal TTP. Minimal voluntary guarding. No rebound. No  obvious distention. Bowel sounds intact. Flexi-seal in place. Genitourinary: Foley is in place, draining urine. Skin: Healed, old midline scar on abdomen. No rashes noted.    Assessment/Plan:  Active Problems:   Rhabdomyolysis   Hypokalemia   Hypocalcemia   Acute renal failure (HCC)   Malnutrition of moderate degree   Palliative care by specialist   DNR (do not resuscitate) discussion   Lethargy   DNR (do not resuscitate)   Weakness generalized  # Colitis Patient presented with ABD pain, N/V, decreased PO intake x 2+ weeks with about 1 week non-bloody diarrhea that started 1 day PTA. CT abdomen pelvis suggestive of diffuse colonic wall thickening with pericolonic edema involving the descending/sigmoid colon suspicious for colitis. Initial WBC 15.1 --> 11.1 , LA 1.3. Flexi-seal in place. - C. Diff and GI path PCR panel negative; may be inflammatory vs. ischemic - Consulted GI today, appreciate their recommendations - Per Dr. Rush Landmark, patient is poor colonoscopy candidate, but may get flexible sig - Will check B12, folate, TSH - Ordered Phenergan 6.25mg  once given worsening nausea on scopolamine, with stable QTc prolongation on EKG. - Flexi-seal in place - Switched patient from clear liquid diet to full liquid diet in hopes she may tolerate Boost Breeze TID that nutrition recommends - Continue Protonix 40mg  daily   # Mild Rhabdomyolysis CK 1266, U/A large hemoglobin, no RBC's. 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  - Continue IVF   #  Severe AKI with Metabolic Acidosis  Creatinine on arrival was 10.23 (up from 1.7 in 2019), BUN 73. Improving slowly to 7.65 and 58, respectively. GFR 4 --> 6, previous 36 in 2019. Likely prerenal due to prolonged poor PO intake and diarrhea. Renal Ultrasound showed increased echogenicity consistent with medical renal disease. Bicarb improving: 9 --> 20 , Anion gap  of 17, currently resolved.  - Nephrology on board, appreciate their recommendations  - Renal function and urine output continue to improve, although patient is a poor dialysis candidate and may require hospice consideration. Patient made DNR after discussion with Palliative Care team and daughter - Will continue goals of care discussions.  - Continue sodium bicarbonate 180mEq, 5% dextrose at 944HQ/PR, metabolic acidosis improving despite not tolerating PO replacement - Strict I&O  # Electrolyte Abnormalities with Moderate Malnutrition Patient has only been consuming broth for at least 2 weeks at home with N/V/D. Nutrition endorses 22% weight loss over past 3 months. LUE second IV line blew and patient has 2nd line in RUE. May consider midline if patient demonstrates need for this. Hypokalemia < 2 on arrival: 3.2 today, unable to tolerate PO KCl, resumed IV replacement only 60mEq  Hypocalcemia - corrected to 6.8 on arrival: corrects to 8.3 today, patient unable to tolerate TUMS, will give calcium gluconate 2g IV Hypomagnesemia - Initially 1.4: 1.6 today, ordered Mg sulfate 2g IV Hypophosphatemia, resolved - improved from 1.9 to 2.5 - Hypokalemia likely due to continued diarrhea; will attempt to give Imodium (and Boost) again if nausea improves enough on Phenergan to give PO meds - Continue cardiac monitoring - Appreciate nephrology's assistance in electrolyte replacements - Follow up afternoon RFP  # Stable Normocytic Anemia  Initial Hgb 8.3 --> 6.6, improved to 7.7 today s/p single RBC transfusion 08/21/20. Iron panel WNL except for high TIBC, although ferritin 86 may be falsely elevated. Likely due to worsening renal disease, although patient also has vaginal bleeding during admission only (10 years post-menopausal without regular OB-GYN follow-up). - Gave 1 RBC transfusion 08/19/20 - continue to monitor daily CBC's / vaginal bleeding - Will hold off on iron for now given GI inflammation though  will likely require this vs. EPO in future - On SCDs for DVT PPx until bleeding resolves - will need OP pap smear, TVUS if patient does not go to hospice  # Hypertension  Blood pressures continue to increase slowly, up to 173/75 mmHg today - Will resume home amlodipine 5mg  if patient is able to tolerate PO after Phenergan - If nausea does not improve, will start IV Labetalol as needed  Prior to Admission Living Arrangement: Home  Anticipated Discharge Location: SNF vs. Hospice  Barriers to Discharge: Electrolyte instability, Poor renal function, Active Colitis  Code Status: DNR/DNI Diet: full Liquid diet IVF: NaHCO3 185mEq, D5 162mL/hr DVT PPx: SCD's given vaginal bleeding - although may resume medicinal anticoagulation if vaginal bleeding resolves  Jeralyn Bennett, MD 08/21/2020, 11:29 AM Pager: [phone] After 5pm on weekdays and 1pm on weekends: On Call pager [phone]

## 2020-08-22 ENCOUNTER — Inpatient Hospital Stay (HOSPITAL_COMMUNITY): Payer: Medicaid Other | Admitting: Certified Registered Nurse Anesthetist

## 2020-08-22 ENCOUNTER — Encounter (HOSPITAL_COMMUNITY): Admission: EM | Disposition: E | Payer: Self-pay | Source: Home / Self Care | Attending: Internal Medicine

## 2020-08-22 ENCOUNTER — Encounter (HOSPITAL_COMMUNITY): Payer: Self-pay | Admitting: Internal Medicine

## 2020-08-22 DIAGNOSIS — N939 Abnormal uterine and vaginal bleeding, unspecified: Secondary | ICD-10-CM

## 2020-08-22 DIAGNOSIS — R6881 Early satiety: Secondary | ICD-10-CM

## 2020-08-22 DIAGNOSIS — K2980 Duodenitis without bleeding: Secondary | ICD-10-CM

## 2020-08-22 HISTORY — PX: ESOPHAGOGASTRODUODENOSCOPY (EGD) WITH PROPOFOL: SHX5813

## 2020-08-22 HISTORY — PX: BIOPSY: SHX5522

## 2020-08-22 HISTORY — PX: FLEXIBLE SIGMOIDOSCOPY: SHX5431

## 2020-08-22 LAB — GLUCOSE, CAPILLARY
Glucose-Capillary: 106 mg/dL — ABNORMAL HIGH (ref 70–99)
Glucose-Capillary: 107 mg/dL — ABNORMAL HIGH (ref 70–99)
Glucose-Capillary: 90 mg/dL (ref 70–99)

## 2020-08-22 LAB — RENAL FUNCTION PANEL
Albumin: 1.8 g/dL — ABNORMAL LOW (ref 3.5–5.0)
Anion gap: 15 (ref 5–15)
BUN: 56 mg/dL — ABNORMAL HIGH (ref 6–20)
CO2: 25 mmol/L (ref 22–32)
Calcium: 6.5 mg/dL — ABNORMAL LOW (ref 8.9–10.3)
Chloride: 95 mmol/L — ABNORMAL LOW (ref 98–111)
Creatinine, Ser: 7.48 mg/dL — ABNORMAL HIGH (ref 0.44–1.00)
GFR calc Af Amer: 6 mL/min — ABNORMAL LOW (ref >60)
GFR calc non Af Amer: 6 mL/min — ABNORMAL LOW (ref >60)
Glucose, Bld: 106 mg/dL — ABNORMAL HIGH (ref 70–99)
Phosphorus: 2.9 mg/dL (ref 2.5–4.6)
Potassium: 3.1 mmol/L — ABNORMAL LOW (ref 3.5–5.1)
Sodium: 135 mmol/L (ref 135–145)

## 2020-08-22 LAB — CBC
HCT: 21.8 % — ABNORMAL LOW (ref 36.0–46.0)
Hemoglobin: 7.4 g/dL — ABNORMAL LOW (ref 12.0–15.0)
MCH: 28.9 pg (ref 26.0–34.0)
MCHC: 33.9 g/dL (ref 30.0–36.0)
MCV: 85.2 fL (ref 80.0–100.0)
Platelets: 202 10*3/uL (ref 150–400)
RBC: 2.56 MIL/uL — ABNORMAL LOW (ref 3.87–5.11)
RDW: 19.9 % — ABNORMAL HIGH (ref 11.5–15.5)
WBC: 11 10*3/uL — ABNORMAL HIGH (ref 4.0–10.5)
nRBC: 0.3 % — ABNORMAL HIGH (ref 0.0–0.2)

## 2020-08-22 LAB — TSH: TSH: 2.131 u[IU]/mL (ref 0.350–4.500)

## 2020-08-22 LAB — VITAMIN B12: Vitamin B-12: 92 pg/mL — ABNORMAL LOW (ref 180–914)

## 2020-08-22 LAB — MAGNESIUM: Magnesium: 1.8 mg/dL (ref 1.7–2.4)

## 2020-08-22 SURGERY — SIGMOIDOSCOPY, FLEXIBLE
Anesthesia: General

## 2020-08-22 MED ORDER — CYANOCOBALAMIN 1000 MCG/ML IJ SOLN
1000.0000 ug | Freq: Every day | INTRAMUSCULAR | Status: AC
  Administered 2020-08-23 – 2020-08-28 (×6): 1000 ug via INTRAMUSCULAR
  Filled 2020-08-22 (×7): qty 1

## 2020-08-22 MED ORDER — CALCIUM GLUCONATE-NACL 2-0.675 GM/100ML-% IV SOLN
2.0000 g | Freq: Once | INTRAVENOUS | Status: AC
  Administered 2020-08-22: 2000 mg via INTRAVENOUS
  Filled 2020-08-22: qty 100

## 2020-08-22 MED ORDER — LOPERAMIDE HCL 1 MG/7.5ML PO SUSP
2.0000 mg | Freq: Once | ORAL | Status: DC
  Filled 2020-08-22: qty 15

## 2020-08-22 MED ORDER — PROPOFOL 10 MG/ML IV BOLUS
INTRAVENOUS | Status: DC | PRN
  Administered 2020-08-22: 200 mg via INTRAVENOUS

## 2020-08-22 MED ORDER — SUCCINYLCHOLINE CHLORIDE 20 MG/ML IJ SOLN
INTRAMUSCULAR | Status: DC | PRN
  Administered 2020-08-22: 160 mg via INTRAVENOUS

## 2020-08-22 MED ORDER — ONDANSETRON HCL 4 MG/2ML IJ SOLN
INTRAMUSCULAR | Status: DC | PRN
  Administered 2020-08-22: 4 mg via INTRAVENOUS

## 2020-08-22 MED ORDER — SODIUM CHLORIDE 0.9 % IV SOLN: INTRAVENOUS | Status: DC | PRN

## 2020-08-22 MED ORDER — SODIUM BICARBONATE 650 MG PO TABS
650.0000 mg | ORAL_TABLET | Freq: Three times a day (TID) | ORAL | Status: DC
  Administered 2020-08-23 (×2): 650 mg via ORAL
  Filled 2020-08-22 (×2): qty 1

## 2020-08-22 MED ORDER — LIDOCAINE 2% (20 MG/ML) 5 ML SYRINGE
INTRAMUSCULAR | Status: DC | PRN
  Administered 2020-08-22: 100 mg via INTRAVENOUS

## 2020-08-22 MED ORDER — POTASSIUM CHLORIDE 20 MEQ PO PACK
40.0000 meq | PACK | Freq: Two times a day (BID) | ORAL | Status: AC
  Administered 2020-08-23: 40 meq via ORAL
  Filled 2020-08-22 (×3): qty 2

## 2020-08-22 MED ORDER — PANTOPRAZOLE SODIUM 40 MG IV SOLR
40.0000 mg | Freq: Two times a day (BID) | INTRAVENOUS | Status: DC
  Administered 2020-08-23 – 2020-08-25 (×5): 40 mg via INTRAVENOUS
  Filled 2020-08-22 (×5): qty 40

## 2020-08-22 MED ORDER — POTASSIUM CHLORIDE 10 MEQ/100ML IV SOLN: 10.0000 meq | INTRAVENOUS | Status: AC

## 2020-08-22 MED ORDER — POTASSIUM CHLORIDE 10 MEQ/100ML IV SOLN
10.0000 meq | Freq: Once | INTRAVENOUS | Status: AC
  Administered 2020-08-22: 10 meq via INTRAVENOUS
  Filled 2020-08-22: qty 100

## 2020-08-22 SURGICAL SUPPLY — 15 items

## 2020-08-22 NOTE — Anesthesia Procedure Notes (Signed)
Procedure Name: Intubation Date/Time: 08/13/2020 10:32 AM Performed by: Lowella Dell, CRNA Pre-anesthesia Checklist: Patient identified, Emergency Drugs available, Suction available and Patient being monitored Patient Re-evaluated:Patient Re-evaluated prior to induction Oxygen Delivery Method: Circle System Utilized Preoxygenation: Pre-oxygenation with 100% oxygen Induction Type: IV induction, Rapid sequence and Cricoid Pressure applied Laryngoscope Size: Mac and 4 Grade View: Grade I Tube type: Oral Tube size: 7.0 mm Number of attempts: 1 Airway Equipment and Method: Stylet Placement Confirmation: ETT inserted through vocal cords under direct vision,  positive ETCO2 and breath sounds checked- equal and bilateral Secured at: 23 cm Tube secured with: Tape Dental Injury: Teeth and Oropharynx as per pre-operative assessment

## 2020-08-22 NOTE — Progress Notes (Signed)
Patient ID: Cristina Jordan, female   DOB: Oct 23, 1961, 59 y.o.   MRN: [id number]  This NP visited patient at the bedside as a follow up for palliative medicine  needs and emotional support.  Patient is alert and oriented, however she remains weak.  Spoke to daughter [name] by phone  Education offered regarding current medical situation.  Both  verbalize an understanding of seriousness of her multiple co-morbid ites and  the importance of continued conversation with her family and the medical providers regarding overall plan of care and treatment options,  ensuring decisions are within the context of the patients values and GOCs.   Patient is open to all offered and available medical interventions to prolong life.  She verbalizes she would agree to dialysis if offered.  Advance care planning blue book has been completed.   Questions and concerns addressed   Discussed with attending team  PMT will continue to support holistically  Total time spent on the unit was 25 minutes Greater than 50% of the time was spent in counseling and coordination of care  Wadie Lessen NP  Palliative Medicine Team Team Phone # [phone] Pager [phone]

## 2020-08-22 NOTE — Anesthesia Preprocedure Evaluation (Addendum)
Anesthesia Evaluation  Patient identified by MRN, date of birth, ID band Patient awake    Reviewed: Allergy & Precautions, NPO status , Patient's Chart, lab work & pertinent test results  Airway Mallampati: II  TM Distance: >3 FB Neck ROM: Full    Dental no notable dental hx. (+) Dental Advisory Given   Pulmonary neg pulmonary ROS,    Pulmonary exam normal breath sounds clear to auscultation       Cardiovascular hypertension, Normal cardiovascular exam Rhythm:Regular Rate:Normal     Neuro/Psych CVA    GI/Hepatic negative GI ROS, Neg liver ROS,   Endo/Other  Morbid obesity  Renal/GU Renal disease     Musculoskeletal negative musculoskeletal ROS (+)   Abdominal (+) + obese,   Peds  Hematology negative hematology ROS (+)   Anesthesia Other Findings   Reproductive/Obstetrics                           Anesthesia Physical Anesthesia Plan  ASA: III  Anesthesia Plan: General   Post-op Pain Management:    Induction: Intravenous, Rapid sequence and Cricoid pressure planned  PONV Risk Score and Plan: 3 and Propofol infusion, TIVA, Treatment may vary due to age or medical condition, Midazolam and Ondansetron  Airway Management Planned: Oral ETT  Additional Equipment: None  Intra-op Plan:   Post-operative Plan: Extubation in OR  Informed Consent: I have reviewed the patients History and Physical, chart, labs and discussed the procedure including the risks, benefits and alternatives for the proposed anesthesia with the patient or authorized representative who has indicated his/her understanding and acceptance.   Patient has DNR.  Discussed DNR with patient and Suspend DNR.   Dental advisory given  Plan Discussed with: CRNA  Anesthesia Plan Comments:     Anesthesia Quick Evaluation

## 2020-08-22 NOTE — Op Note (Addendum)
[facility] Patient Name: Cristina Jordan Procedure Date : 08/31/2020 MRN: [id number] Attending MD: Justice Britain , MD Date of Birth: Jul 01, 1961 CSN: [id number] Age: 59 Admit Type: Inpatient Procedure:                Upper GI endoscopy Indications:              Generalized abdominal pain, Diarrhea, Early                            satiety, Nausea with vomiting Providers:                Justice Britain, MD, Glori Bickers, RN, Fransico Setters                            Mbumina, Technician Referring MD:             Medical Service Medicines:                General Anesthesia Complications:            No immediate complications. Estimated Blood Loss:     Estimated blood loss was minimal. Procedure:                Pre-Anesthesia Assessment:                           - Prior to the procedure, a History and Physical                            was performed, and patient medications and                            allergies were reviewed. The patient's tolerance of                            previous anesthesia was also reviewed. The risks                            and benefits of the procedure and the sedation                            options and risks were discussed with the patient.                            All questions were answered, and informed consent                            was obtained. Prior Anticoagulants: The patient has                            taken no previous anticoagulant or antiplatelet                            agents. ASA Grade Assessment: III - A patient with  severe systemic disease. After reviewing the risks                            and benefits, the patient was deemed in                            satisfactory condition to undergo the procedure.                           After obtaining informed consent, the endoscope was                            passed under direct vision. Throughout the                             procedure, the patient's blood pressure, pulse, and                            oxygen saturations were monitored continuously. The                            [id number]-H190 ([id number]) Olympus gastroscope was                            introduced through the mouth, and advanced to the                            second part of duodenum. The upper GI endoscopy was                            accomplished without difficulty. The patient                            tolerated the procedure. Scope In: Scope Out: Findings:      Normal mucosa was found in the entire esophagus.      The mid esophagus and distal esophagus were significantly tortuous.      A 4 cm hiatal hernia was present.      Patchy mildly erythematous mucosa without bleeding was found in the       gastric body and in the gastric antrum.      No other gross lesions were noted in the entire examined stomach.       Biopsies were taken with a cold forceps for histology and Helicobacter       pylori testing.      Three non-bleeding superficial duodenal ulcers with a clean ulcer base       (Forrest Class III) were found in the duodenal bulb. The largest lesion       was 9 mm in largest dimension.      Patchy moderate inflammation characterized by erosions, erythema,       friability and granularity was found in the duodenal bulb, in the first       portion of the duodenum and in the second portion of the duodenum.      No gross lesions were noted in the second portion of the duodenum.  Biopsies were taken with a cold forceps in the duodenal bulb, in the       first portion of the duodenum and in the second portion of the duodenum       for histology. Impression:               - Normal mucosa was found in the entire esophagus.                           - Tortuous esophagus distally.                           - 4 cm hiatal hernia.                           - Erythematous mucosa in the gastric body and                            antrum.                            - No other gross lesions in the stomach. Biopsied.                           - Non-bleeding duodenal ulcers with a clean ulcer                            base (Forrest Class III).                           - Duodenitis.                           - No gross lesions in the second portion of the                            duodenum.                           - Biopsies were taken with a cold forceps for                            histology in the duodenal bulb, in the first                            portion of the duodenum and in the second portion                            of the duodenum.                           - Query if the duodenitis and duodenal ulcers could                            be ischemic in nature. Recommendation:           - Proceed to scheduled colonoscopy.                           -  Await pathology results.                           - PPI BID for now. If not tolerating PO then IV PPI                            BID until able.                           - Anti-emetics as per primary service.                           - Repeat EGD in 86-months for follow up of duodenal                            ulcers.                           - If dysphagia were to be an issue after acute                            issues develop then query manometry and potential                            dilation of esophagus but would be higher in risk                            due to the tortuosity distally.                           - The findings and recommendations were discussed                            with the patient.                           - The findings and recommendations were discussed                            with the referring physician. Procedure Code(s):        --- Professional ---                           [phone], Esophagogastroduodenoscopy, flexible,                            transoral; with biopsy, single or multiple Diagnosis Code(s):        ---  Professional ---                           Q39.9, Congenital malformation of esophagus,                            unspecified  K44.9, Diaphragmatic hernia without obstruction or                            gangrene                           K31.89, Other diseases of stomach and duodenum                           K26.9, Duodenal ulcer, unspecified as acute or                            chronic, without hemorrhage or perforation                           K29.80, Duodenitis without bleeding                           R10.84, Generalized abdominal pain                           R19.7, Diarrhea, unspecified                           R68.81, Early satiety                           R11.2, Nausea with vomiting, unspecified CPT copyright 2019 [employer]. All rights reserved. The codes documented in this report are preliminary and upon coder review may  be revised to meet current compliance requirements. Justice Britain, MD 09/05/2020 5:51:25 PM Number of Addenda: 0

## 2020-08-22 NOTE — Progress Notes (Addendum)
Subjective:   Today, Cristina Jordan complains of dry mouth and sore throat following her EGD procedure. She continues to have intermittent abdominal pain, constant since admission. She also continues to have nausea with some gagging while we were speaking. She says that she continues to have diarrhea consistently and denies any blood in it. She says the flexi-seal was removed and she would like to keep it out as she is more comfortable. She says she still has vaginal bleeding. Denies any light-headedness, issues swallowing, SOB, CP, new or worsening swelling, fever, chills or any other symptoms. Today, patient lost both of her peripheral IV lines.  She notes she would like to have the COVID vaccine once nausea improves and she is more stable.   Objective:  Vital signs in last 24 hours: Vitals:   08/26/2020 1134 08/13/2020 1145 08/14/2020 1155 09/09/2020 1218  BP: (!) 141/43 (!) 149/67 (!) 159/82 132/84  Pulse: 97 97 95 93  Resp: 18 19 (!) 21 16  Temp: (!) 97.2 F (36.2 C)   99.2 F (37.3 C)  TempSrc: Temporal   Oral  SpO2: 100% 100% 100% 100%  Weight:      Height:       General: Patient is morbidly obese. Patient appears tired and nauseas, but otherwise NAD.  Eyes: Sclera non-icteric. No conjunctival injection. Respiratory: Unable to assess due to nausea. Cardiovascular: Rate is tachycardic. rhythm is regular. No murmurs, rubs, or gallops. There is trace bilateral lower extremity pitting edema. SCDs in place. Neurological: Patient has R-sided hemiparesis.  GU: No obvious vaginal bleeding is visualized. Abdominal: Soft with mild upper abdominal tenderness to palpation with voluntary guarding but no rebound. No lower abdominal tenderness. No obvious distention. Bowel sounds intact. Flexi-seal has been removed. Genitourinary: Foley is in place, draining urine. Skin: Healed, old midline scar on abdomen. No rashes noted.    Assessment/Plan:  Active Problems:   Rhabdomyolysis   Hypokalemia    Hypocalcemia   Acute renal failure (HCC)   Malnutrition of moderate degree   Palliative care by specialist   DNR (do not resuscitate) discussion   Lethargy   DNR (do not resuscitate)   Weakness generalized  # Colitis, Upper abdominal pain, with Refractory Nausea Patient presented with ABD pain, N/V, decreased PO intake x 2+ weeks with about 1 week non-bloody diarrhea that started 1 day PTA. CT abdomen pelvis suggestive of diffuse colonic wall thickening with pericolonic edema involving the descending/sigmoid colon suspicious for colitis. Initial WBC 15.1 --> 11.0 , LA 1.3. Flexi-seal in place. Flex sig today showed no obvious signs of IBD, although biopsy was obtained. EGD showed a 4cm hiatal hernia with erythema of the gastric body/antrum, duodenitis with multiple non-bleeding ulcers. - C. Diff and GI path PCR panel negative, unlikely infectious colitis - follow up biopsy results 08/25/20 to r/o IBD, consider ischemic colitis - Consider ischemic cause for multiple GI ulcer; will start IV PPI BID - Will give one more dose of Imodium 2g once now given no IVF and diarrhea/vomiting, but will discontinue once IV access established, per GI recs - Vitamin B12 92 - started B12 1080mcg daily x 1 week - Folate pending - Continue scopolamine patch for nausea; Limit Phenergan use to minimal dosing given prolonged QTc ~ 500 - Continue clear liquid diet as tolerated  # Mild Rhabdomyolysis CK 1266, U/A large hemoglobin, no RBC's. 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  - Continue  IVF   # Severe AKI on likely Chronic Renal Failure stage IIIb with Metabolic Acidosis (Currently Resolved)  Creatinine on arrival was 10.23 (up from 1.7 in 2019), BUN 73. Improving slowly to 7.48 and 56, respectively. GFR 4 --> 6, previous 36 in 2019. Likely prerenal due to prolonged poor PO intake and diarrhea. Renal Ultrasound showed  increased echogenicity consistent with medical renal disease. Bicarb 25, AG 15 today. - Patient lost both of her peripheral IV's today - Discussed with IR; patient will be scheduled for PICC line placement in LUE tomorrow given patient is a very poor dialysis candidate, per nephro - Nephrology on board, appreciate their recommendations  - Renal function and urine output continue to slowly improve. Patient made DNR after discussion with Palliative Care team and daughter and may consider hospice at discharge. - Will continue goals of care discussions.  - Continue sodium bicarbonate 142mEq, 5% dextrose at 426ST/MH, metabolic acidosis improving despite not tolerating PO replacement - Strict I&O  # Electrolyte Abnormalities with Moderate Malnutrition Patient has only been consuming broth for at least 2 weeks at home with N/V/D. Nutrition endorses 22% weight loss over past 3 months. LUE second IV line blew and patient has 2nd line in RUE. May consider midline if patient demonstrates need for this. Hypokalemia < 2 on arrival: 3.1 today Hypocalcemia - corrected to 6.8 on arrival: corrects to 8.3 today Hypomagnesemia - Initially 1.4: 1.8 Hypophosphatemia, resolved - improved from 1.9 to 2.9 - Hypokalemia likely due to continued diarrhea; however, holding Imodium and antidiarrheals per GI recommendations - Attempt PO replacement of electrolytes tonight given patient has no IV's - Continue cardiac monitoring - Follow up morning RFP  # Stable Normocytic Anemia  Initial Hgb 8.3 --> 6.6, improved to 7.4 s/p single RBC transfusion 08/21/20. Iron panel WNL except for high TIBC, although ferritin 86 may be falsely elevated. Likely due to worsening renal disease, although patient also has vaginal bleeding during admission only (10 years post-menopausal without regular OB-GYN follow-up). Vaginal bleeding has improved - Gave 1 RBC transfusion 08/19/20, Hgb continues to be stable - continue to monitor daily CBC's /  vaginal bleeding - Will hold off on iron for now given GI inflammation though will likely require this vs. EPO in future - On SCDs for DVT PPx until bleeding resolves - will need OP pap smear, TVUS if patient does not go to hospice  # Hypertension  Blood pressures better controlled s/p 1 dose Labetalol  - Will resume home amlodipine 5mg  once patient is able to tolerate PO if BP remains stable - If nausea does not improve, will start IV Labetalol as needed for <180/1100  Prior to Admission Living Arrangement: Home  Anticipated Discharge Location: SNF vs. Hospice  Barriers to Discharge: Electrolyte instability, Poor renal function, Active Colitis  Code Status: DNR/DNI Diet: clear liquid diet IVF: None given no IV able to be placed DVT PPx: SCD's given vaginal bleeding - although may resume medicinal anticoagulation if vaginal bleeding resolves  Jeralyn Bennett, MD 09/05/2020, 6:43 PM Pager: [phone] After 5pm on weekdays and 1pm on weekends: On Call pager [phone]

## 2020-08-22 NOTE — Transfer of Care (Signed)
Immediate Anesthesia Transfer of Care Note  Patient: Cristina Jordan  Procedure(s) Performed: FLEXIBLE SIGMOIDOSCOPY (N/A ) ESOPHAGOGASTRODUODENOSCOPY (EGD) WITH PROPOFOL (N/A ) BIOPSY  Patient Location: PACU and Endoscopy Unit  Anesthesia Type:General  Level of Consciousness: drowsy and patient cooperative  Airway & Oxygen Therapy: Patient Spontanous Breathing and Patient connected to face mask oxygen  Post-op Assessment: Report given to RN and Post -op Vital signs reviewed and stable  Post vital signs: Reviewed and stable  Last Vitals:  Vitals Value Taken Time  BP    Temp    Pulse    Resp    SpO2      Last Pain:  Vitals:   08/25/2020 0904  TempSrc: Oral  PainSc: 7       Patients Stated Pain Goal: 0 (10/40/45 9136)  Complications: No complications documented.

## 2020-08-22 NOTE — Progress Notes (Signed)
Patient lost IV access and IV team was unsuccessful in obtaining another line. Konrad Penta, MD notified and made aware.MD stated that they would reach out to see if a central line could be placed. Will continue to assess situation.

## 2020-08-22 NOTE — Progress Notes (Signed)
[facility] Progress Note  Subjective: Had scopes today, results pending.  Lost IV access.  Vitals:   09/03/2020 1134 09/05/2020 1145 09/06/2020 1155 09/02/2020 1218  BP: (!) 141/43 (!) 149/67 (!) 159/82 132/84  Pulse: 97 97 95 93  Resp: 18 19 (!) 21 16  Temp: (!) 97.2 F (36.2 C)   99.2 F (37.3 C)  TempSrc: Temporal   Oral  SpO2: 100% 100% 100% 100%  Weight:      Height:        Exam: Gen pleasant, very obese, sitting in bed NECK: No jvd or bruits, flat neck veins  PULM: clear bilat to bases no rales or wheezing CV: RRR no MRG ABD: soft ntnd no mass or ascites +bs obese Ext no LE or UE edema Neuro is alert , nf, no asterixis, sig debilitated/ weakened    Home meds:  - norvasc 5 qd/ amoxcillin 500 tid/ norco qid prn   UA 0-5 wbc/ rbc, large Hb, hazy, prot 30, few bact UNa 31 CXR - No active disease. CT abd - No hydronephrosis or perinephric edema. Lobulated bilateral renal contours. Partially distended urinary bladder without wall thickening. US Renal - IMPRESSION: 1. Mildly increased echogenicity of both kidneys, compatible with medical renal disease. 8.3 and 8.9 cm long.  2. No hydronephrosis.   Last creat in Epic was July 2019 creat = 1.77, eGFR 36, nothing in CE   Lab today > K 2.5 ^, BUN 72, Creat 9.90 , Ca 5.7  , P 4 , alb 2.1, CO2 < 7   Assessment/ Plan: 1. Renal failure - b/l creat from 2019 was 1.77, eGFR 36.  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  Sig N/V for weeks prior to admission. Creat 10 on admit,  down to 9's--> 8.77--> 8.3--> 7.3-->7.4. Continue Na bicarb, aggressive repletion of Ca, bicarb, K.  Appreciate palliative care.  She is a very poor dialysis candidate but fortunately is improving. Continue bicarb gtt when IV access obtained   2. Hypokalemia - up to 2.5--> 2.8--> 3.1.  IV repletion when IV access obtained 3. Hypocalcemia -Tums 2 bid, calcium gluconate prn 4. Anion and nonanion gap metabolic acidosis- on bicarb gtt and PO  as well 5. H/o CVA - R hemiparesis and aphasia 6. Chronic debility - bed to WC-bound, immobile x 2-3 weeks since she got sick 7. Anemia - Hb 8.5--> 6.6, s/p 1 u pRBCs 9/7 8. Diarrhea/ colitis- got scopes today, results pending 9. Dispo: pending, appreciate palliative care      Madelon Lips MD 08/21/2020, 3:34 PM   Recent Labs  Lab 08/21/20 0433 08/21/20 1238 08/21/20 1642 08/31/2020 0154  K 3.2*   < > 3.2* 3.1*  BUN 58*   < > 57* 56*  CREATININE 7.65*   < > 7.54* 7.48*  CALCIUM 6.6*   < > 6.6* 6.5*  PHOS 2.5   < > 2.6 2.9  HGB 7.7*  --   --  7.4*   < > = values in this interval not displayed.   Inpatient medications: . amLODipine  5 mg Oral Daily  . calcium carbonate  400 mg of elemental calcium Oral BID WC  . Chlorhexidine Gluconate Cloth  6 each Topical Daily  . cyanocobalamin  1,000 mcg Intramuscular Daily  . pantoprazole  40 mg Oral Daily  . scopolamine  1 patch Transdermal Q72H  . sodium bicarbonate  650 mg Oral TID  . sodium chloride flush  3 mL Intravenous Q12H   .  sodium chloride    . potassium chloride    . sodium bicarbonate in D5W 1000 mL infusion 150 mL/hr at 08/21/20 1834

## 2020-08-22 NOTE — Op Note (Signed)
[facility] Patient Name: Cristina Jordan Procedure Date : 09/03/2020 MRN: [id number] Attending MD: Justice Britain , MD Date of Birth: [birth year] CSN: [id number] Age: 59 Admit Type: Inpatient Procedure:                Flexible Sigmoidoscopy Indications:              Generalized abdominal pain, Abnormal CT of the GI                            tract, Diarrhea Providers:                Justice Britain, MD, Glori Bickers, RN, Fransico Setters                            [place], Technician Referring MD:             Medical Service Medicines:                General Anesthesia Complications:            No immediate complications. Estimated Blood Loss:     Estimated blood loss was minimal. Procedure:                Pre-Anesthesia Assessment:                           - Prior to the procedure, a History and Physical                            was performed, and patient medications and                            allergies were reviewed. The patient's tolerance of                            previous anesthesia was also reviewed. The risks                            and benefits of the procedure and the sedation                            options and risks were discussed with the patient.                            All questions were answered, and informed consent                            was obtained. Prior Anticoagulants: The patient has                            taken no previous anticoagulant or antiplatelet                            agents. ASA Grade Assessment: III - A patient with  severe systemic disease. After reviewing the risks                            and benefits, the patient was deemed in                            satisfactory condition to undergo the procedure.                           After obtaining informed consent, the scope was                            passed under direct vision. The PCF-H190DL                             ([id number] Olympus pediatric colonscope was                            introduced through the anus and advanced to the the                            descending colon. The flexible sigmoidoscopy was                            accomplished without difficulty. The patient                            tolerated the procedure. The quality of the bowel                            preparation was inadequate. Scope In: 11:03:39 AM Scope Out: 11:10:05 AM Total Procedure Duration: 0 hours 6 minutes 26 seconds  Findings:      The digital rectal exam findings include hemorrhoids. Pertinent       negatives include no palpable rectal lesions.      Extensive amounts of semi-liquid semi-solid stool was found in the       entire colon and adherent in most places, interfering with       visualization. Lavage of the area was performed using copious amounts,       resulting in incomplete clearance with continued poor visualization.      After lavage, normal appearing mucosa was found in the visualized colon       when there wasn't overt stool adherent to the wall. Biopsies were taken       with a cold forceps for histology to rule out chronic colitis but this       did not have evidence of endoscopic colitis.      Multiple small-mouthed diverticula were found in the recto-sigmoid colon       and sigmoid colon.      Non-bleeding non-thrombosed external and internal hemorrhoids were found       during retroflexion, during perianal exam and during digital exam. The       hemorrhoids were Grade II (internal hemorrhoids that prolapse but reduce       spontaneously). Impression:               -  Preparation of the colon was inadequate.                           - Hemorrhoids found on digital rectal exam.                           - Stool in the entire examined colon - adherent to                            the wall; lavaged.                           - Normal mucosa noted in most regions of the left                             colon that were not full of stool. Biopsied to rule                            out chronic colitis, but does not have appearance                            of IBD.                           - Diverticulosis in the recto-sigmoid colon and in                            the sigmoid colon.                           - Non-bleeding non-thrombosed external and internal                            hemorrhoids. Recommendation:           - The patient will be observed post-procedure,                            until all discharge criteria are met.                           - Return patient to hospital ward for ongoing care.                           - Patient has a contact number available for                            emergencies. The signs and symptoms of potential                            delayed complications were discussed with the                            patient. Return to normal activities tomorrow.  Written discharge instructions were provided to the                            patient.                           - High fiber diet.                           - Await pathology results.                           - Observe the patient for one hour.                           - Observe clinical course.                           - Suspect diarrhea and findings on CT initially a                            result of ischemia, though currenlty no overt                            evidence endoscopically.                           - May use Immodium or Lomotil if necessary for                            treatment, but would try to minimize this if                            possible.                           - At some point in future patient needs a                            hospital-based Colonoscopy for colon cancer                            screening. She is an unassigned patient until                            follow up is actually performed in office,  so that                            will be something that can be arranged based on                            clinical course during hospitalization and recovery.                           - Inpatient GI service will be available over  weekend if questions but biopsies won't be back                            until Monday, so [place] service can be reached at                            that time if necessary and if patient symptoms                            still an issue. Feel free to call coverage if                            necessary over weekend however if urgent issues                            develop.                           - The findings and recommendations were discussed                            with the patient.                           - The findings and recommendations were discussed                            with the referring physician. Procedure Code(s):        --- Professional ---                           [phone], Sigmoidoscopy, flexible; with biopsy, single                            or multiple Diagnosis Code(s):        --- Professional ---                           K64.1, Second degree hemorrhoids                           R10.84, Generalized abdominal pain                           R19.7, Diarrhea, unspecified                           K57.30, Diverticulosis of large intestine without                            perforation or abscess without bleeding                           R93.3, Abnormal findings on diagnostic imaging of  other parts of digestive tract CPT copyright 2019 [employer]. All rights reserved. The codes documented in this report are preliminary and upon coder review may  be revised to meet current compliance requirements. Justice Britain, MD 08/24/2020 6:01:32 PM Number of Addenda: 0

## 2020-08-22 NOTE — Interval H&P Note (Signed)
History and Physical Interval Note:  08/29/2020 10:08 AM  Cristina Jordan  has presented today for surgery, with the diagnosis of coliltis, abd pain, nausea, vomiting.  The various methods of treatment have been discussed with the patient and family. After consideration of risks, benefits and other options for treatment, the patient has consented to  Procedure(s): FLEXIBLE SIGMOIDOSCOPY (N/A) ESOPHAGOGASTRODUODENOSCOPY (EGD) WITH PROPOFOL (N/A) as a surgical intervention.  The patient's history has been reviewed, patient examined, no change in status, stable for surgery.  I have reviewed the patient's chart and labs.  Questions were answered to the patient's satisfaction.     [employer]

## 2020-08-22 NOTE — Progress Notes (Signed)
Patient has an order for three runs of IV potassium. Currently no IV access. MD aware.

## 2020-08-23 ENCOUNTER — Inpatient Hospital Stay (HOSPITAL_COMMUNITY): Payer: Medicaid Other

## 2020-08-23 HISTORY — PX: IR US GUIDE VASC ACCESS LEFT: IMG2389

## 2020-08-23 HISTORY — PX: IR FLUORO GUIDE CV LINE LEFT: IMG2282

## 2020-08-23 LAB — CBC
HCT: 23.2 % — ABNORMAL LOW (ref 36.0–46.0)
Hemoglobin: 7.7 g/dL — ABNORMAL LOW (ref 12.0–15.0)
MCH: 29.2 pg (ref 26.0–34.0)
MCHC: 33.2 g/dL (ref 30.0–36.0)
MCV: 87.9 fL (ref 80.0–100.0)
Platelets: 186 10*3/uL (ref 150–400)
RBC: 2.64 MIL/uL — ABNORMAL LOW (ref 3.87–5.11)
RDW: 20.4 % — ABNORMAL HIGH (ref 11.5–15.5)
WBC: 11.1 10*3/uL — ABNORMAL HIGH (ref 4.0–10.5)
nRBC: 0.2 % (ref 0.0–0.2)

## 2020-08-23 LAB — RENAL FUNCTION PANEL
Albumin: 1.9 g/dL — ABNORMAL LOW (ref 3.5–5.0)
Anion gap: 15 (ref 5–15)
BUN: 59 mg/dL — ABNORMAL HIGH (ref 6–20)
CO2: 24 mmol/L (ref 22–32)
Calcium: 6.4 mg/dL — CL (ref 8.9–10.3)
Chloride: 98 mmol/L (ref 98–111)
Creatinine, Ser: 7.33 mg/dL — ABNORMAL HIGH (ref 0.44–1.00)
GFR calc Af Amer: 7 mL/min — ABNORMAL LOW (ref >60)
GFR calc non Af Amer: 6 mL/min — ABNORMAL LOW (ref >60)
Glucose, Bld: 89 mg/dL (ref 70–99)
Phosphorus: 3.6 mg/dL (ref 2.5–4.6)
Potassium: 3.9 mmol/L (ref 3.5–5.1)
Sodium: 137 mmol/L (ref 135–145)

## 2020-08-23 LAB — GLUCOSE, CAPILLARY
Glucose-Capillary: 84 mg/dL (ref 70–99)
Glucose-Capillary: 87 mg/dL (ref 70–99)
Glucose-Capillary: 88 mg/dL (ref 70–99)

## 2020-08-23 LAB — MAGNESIUM: Magnesium: 1.8 mg/dL (ref 1.7–2.4)

## 2020-08-23 MED ORDER — EMETROL 1.87-1.87-21.5 PO SOLN
15.0000 mL | Freq: Two times a day (BID) | ORAL | Status: DC | PRN
  Administered 2020-08-24: 15 mL via ORAL
  Filled 2020-08-23: qty 118

## 2020-08-23 MED ORDER — SODIUM CHLORIDE 0.9 % IV SOLN: INTRAVENOUS | Status: AC

## 2020-08-23 MED ORDER — LIDOCAINE-EPINEPHRINE 1 %-1:100000 IJ SOLN
INTRAMUSCULAR | Status: AC
  Filled 2020-08-23: qty 1

## 2020-08-23 MED ORDER — CHLORHEXIDINE GLUCONATE 4 % EX LIQD
CUTANEOUS | Status: AC
  Filled 2020-08-23: qty 15

## 2020-08-23 MED ORDER — LIDOCAINE-EPINEPHRINE 1 %-1:100000 IJ SOLN
INTRAMUSCULAR | Status: DC | PRN
  Administered 2020-08-23: 10 mL via INTRADERMAL

## 2020-08-23 MED ORDER — CALCIUM GLUCONATE-NACL 1-0.675 GM/50ML-% IV SOLN
1.0000 g | Freq: Once | INTRAVENOUS | Status: AC
  Administered 2020-08-23: 1000 mg via INTRAVENOUS
  Filled 2020-08-23: qty 50

## 2020-08-23 MED ORDER — EMETROL 1.87-1.87-21.5 PO SOLN
15.0000 mL | Freq: Three times a day (TID) | ORAL | Status: DC | PRN
  Filled 2020-08-23: qty 118

## 2020-08-23 NOTE — Progress Notes (Signed)
Patient have an order for Calcium Gluconate 1 g/50 ml sodium Chloride IVPB intravenous Once,  and patient have no IV access since yesterday 9/10 . Paged the resident MD, informing that patient does not have any PICC line or peripheral iv line.  IR will schedule patient for PICC line placement today.

## 2020-08-23 NOTE — Procedures (Signed)
Interventional Radiology Procedure Note  Procedure: Right IJ tunneled DL PowerLine placed.  Tips at CAJ and ready for use.   Complications: None  Estimated Blood Loss: None  Recommendations: - Routine line care  Signed,  Criselda Peaches, MD

## 2020-08-23 NOTE — Plan of Care (Signed)
  Problem: Education: Goal: Knowledge of General Education information will improve Description: Including pain rating scale, medication(s)/side effects and non-pharmacologic comfort measures Outcome: Progressing   Problem: Nutrition: Goal: Adequate nutrition will be maintained Outcome: Progressing   Problem: Elimination: Goal: Will not experience complications related to bowel motility Outcome: Progressing   

## 2020-08-23 NOTE — Progress Notes (Addendum)
Subjective:   Today, Cristina Jordan had an IJ tunneled cath placement by IR. (She lost her IV access last night).  She is slightly hard to understand today. Continues to have diarrhea. She is not sure better she still has vaginal bleeding.  She mentions that her nausea is better.  She refused Tums though.  She mentions that she has some back pain and wants staff to reposition her.  She does not have any other complaints.  Objective:  Vital signs in last 24 hours: Vitals:   09/06/2020 1218 08/24/2020 2047 08/23/20 0432 08/23/20 0900  BP: 132/84 138/88 131/82 134/81  Pulse: 93 (!) 102 (!) 106 99  Resp: 16 18 18 18   Temp: 99.2 F (37.3 C) 98.6 F (37 C) 98.2 F (36.8 C) 98.4 F (36.9 C)  TempSrc: Oral Oral    SpO2: 100% 100% 100% 100%  Weight:      Height:       General: Obese lady, laying in bed in no acute distress. Appears slightly dry Respiratory: Anterior and lateral chest exam is clear to auscultation Cardiovascular: Rate is monthly tachycardic. rhythm is regular. No murmurs Neurological: Residual R-sided hemiparesis.  Abdominal: Soft , nondistended Extremities: SCDs are in place. No LEE   Assessment/Plan:  Active Problems:   Rhabdomyolysis   Hypokalemia   Hypocalcemia   Acute renal failure (HCC)   Malnutrition of moderate degree   Palliative care by specialist   DNR (do not resuscitate) discussion   Lethargy   DNR (do not resuscitate)   Weakness generalized   # Severe AKI on likely Chronic Renal Failure stage IIIb with Metabolic Acidosis (Currently Resolved)  Creatinine on arrival was 10.23 (up from 1.7 in 2019), BUN 73. Improving slowly to 7.48 and 56, respectively. GFR 4 --> 6, previous 36 in 2019. Likely prerenal due to prolonged poor PO intake and diarrhea. Renal Ultrasound showed increased echogenicity consistent with medical renal disease. Bicarb 25, AG 15 today.  - Renal function and urine output continue to slowly improve.  Nephrology signed off - Will  continue goals of care discussions.  - Continue sodium bicarbonate 650 mg 3 times daily -I/O now negative. Likely post ATN diuresis. NS 150 ml/h  - Strict I&O  # Mild Rhabdomyolysis CK 1266, U/A large hemoglobin, no RBC's. 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  - received IVF   #Severe electrolyte Abnormalities with Moderate Malnutrition: # QTC prolongation Electrolytes abnormality is improving with replacement. Potassium on arrival was less than 2, corrected calcium was 6.8, magnesium was 1.4 likely secondary to poor p.o. intake, nausea vomiting and diarrhea.  Potassium today 3.8. Calcium (corrected) is 8 today. Mg 1.8 She lost her IV access last night and IR placed PICC line this morning.  -Has order for IV calcium gluconate today -Continue Tums 400 mg twice daily (patient refused it today) -BMP daily - Continue cardiac monitoring - Follow up morning RFP -Continue vitamin B-12 1000 MCG dailyx7 days -Cardiac monitoring -Avoid QT prolonging meds  # Refractory nausea, diarrhea and colitis: Pt presented with n/V, abdominal pain and diarrhea. CT abdomen pelvis suggestive of diffuse colonic wall thickening with pericolonic edema involving the descending/sigmoid colon suspicious for colitis. Initial WBC 15.1 --> 11.0 , LA 1.3.  Flex sig today showed no obvious signs of IBD, although biopsy was obtained. EGD showed a 4cm hiatal hernia with erythema of the gastric body/antrum, duodenitis with multiple non-bleeding ulcers.  Nausea  improved C diff and GI panel were negative. EGD and sigmoidoscopy did not show active bleeding or evidence of IBD. EGD showed non bleeding, clean base duodenal ulcer. Bx pending.   -Continue ppi bid -high fiber diet -repeat egd in 3 months (depends on GOC) -Trying to minimize Imodium and antidiarrheals per GI recommendations scopolamine patch for nausea -Ordered PRN  emetrol for nausea today -f/u biopsy result -Dced Flexi-seal due to pain  # Stable Normocytic Anemia  Likely worsened due to vaginal bleeding and in setting of kidney disease Initial Hgb 8.3 --> 6.6, improved to 7.4 s/p single RBC transfusion 08/21/20.  Iron panel WNL except for high TIBC, although ferritin 86 may be falsely elevated.   -Hemoglobin stable at 7.7 - continue to monitor daily CBC's / vaginal bleeding - Will hold off on iron for now given GI inflammation though will likely require this vs. EPO in future   #Vaginal bleeding Improved Patient is postmenopausal and without OB-GYN follow-up.  - will need outpatient follow-up with OB/GYN OP pap smear,  - Gave 1 RBC transfusion 08/19/20, Hgb continues to be stable - On SCDs for DVT PPx until bleeding resolves -Consider TVUS if patient does not go to hospice  # Hypertension  stable today -Continue home amlodipine 5mg  daily   Prior to Admission Living Arrangement: Home  Anticipated Discharge Location: SNF vs. Hospice  Barriers to Discharge: Electrolyte instability, Poor renal function  Code Status: DNR/DNI Diet: clear liquid diet IVF: NS 150 ml/h  DVT PPx: SCD's (held prophylaxis anticoagulation given vaginal bleeding. may resume if vaginal bleeding resolves)  [name], MD 08/23/2020, 4:42 PM Pager: [phone] After 5pm on weekdays and 1pm on weekends: On Call pager [phone]

## 2020-08-23 NOTE — Progress Notes (Signed)
[facility] Progress Note  Subjective: Still no IV access.  Is refusing TUMS.  Still has ongoing diarrhea.  Vitals:   08/27/2020 1218 09/07/2020 2047 08/23/20 0432 08/23/20 0900  BP: 132/84 138/88 131/82 134/81  Pulse: 93 (!) 102 (!) 106 99  Resp: 16 18 18 18  Temp: 99.2 F (37.3 C) 98.6 F (37 C) 98.2 F (36.8 C) 98.4 F (36.9 C)  TempSrc: Oral Oral    SpO2: 100% 100% 100% 100%  Weight:      Height:        Exam: Gen pleasant, very obese, sitting in bed NECK: No jvd or bruits, flat neck veins  PULM: clear bilat to bases no rales or wheezing CV: RRR no MRG ABD: soft ntnd no mass or ascites +bs obese Ext no LE or UE edema Neuro is alert , nf, no asterixis, sig debilitated/ weakened    Home meds:  - norvasc 5 qd/ amoxcillin 500 tid/ norco qid prn   UA 0-5 wbc/ rbc, large Hb, hazy, prot 30, few bact UNa 31 CXR - No active disease. CT abd - No hydronephrosis or perinephric edema. Lobulated bilateral renal contours. Partially distended urinary bladder without wall thickening. US Renal - IMPRESSION: 1. Mildly increased echogenicity of both kidneys, compatible with medical renal disease. 8.3 and 8.9 cm long.  2. No hydronephrosis.   Last creat in Epic was July 2019 creat = 1.77, eGFR 36, nothing in CE   Lab today > K 2.5 ^, BUN 72, Creat 9.90 , Ca 5.7  , P 4 , alb 2.1, CO2 < 7   Assessment/ Plan: 1. Renal failure - b/l creat from 2019 was 1.77, eGFR 36.  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  Sig N/V for weeks prior to admission. Creat 10 on admit,  down to 9's--> 8.77--> 8.3--> 7.3-->7.4. Continue Na bicarb, aggressive repletion of Ca, bicarb, K.  Appreciate palliative care.  She is a very poor dialysis candidate but fortunately is improving. She has been without IV access for about a day now and is maintaining her Cr.  Have discontinued bicarb gtt.   2. Hypokalemia - up to 2.5--> 2.8--> 3.1--> 3.9 on repletion 3. Hypocalcemia -Tums 2 bid, calcium  gluconate prn 4. Anion and nonanion gap metabolic acidosis- on PO  5. H/o CVA - R hemiparesis and aphasia 6. Chronic debility - bed to WC-bound, immobile x 2-3 weeks since she got sick 7. Anemia - Hb 8.5--> 6.6, s/p 1 u pRBCs 9/7 8. Diarrhea/ colitis- got scopes today, results pending 9. Dispo: pending, appreciate palliative care  Nothing further to add.  Will sign off.  Call with wuestions        Elizabeth Upton MD 08/23/2020, 12:42 PM   Recent Labs  Lab 08/31/2020 0154 08/23/20 0414  K 3.1* 3.9  BUN 56* 59*  CREATININE 7.48* 7.33*  CALCIUM 6.5* 6.4*  PHOS 2.9 3.6  HGB 7.4* 7.7*   Inpatient medications: . amLODipine  5 mg Oral Daily  . calcium carbonate  400 mg of elemental calcium Oral BID WC  . chlorhexidine      . Chlorhexidine Gluconate Cloth  6 each Topical Daily  . cyanocobalamin  1,000 mcg Intramuscular Daily  . lidocaine-EPINEPHrine      . loperamide HCl  2 mg Oral Once  . pantoprazole (PROTONIX) IV  40 mg Intravenous BID  . potassium chloride  40 mEq Oral BID  . scopolamine  1 patch Transdermal Q72H  . sodium bicarbonate    650 mg Oral TID  . sodium chloride flush  3 mL Intravenous Q12H   . sodium chloride    . calcium gluconate    . sodium bicarbonate in D5W 1000 mL infusion Stopped (08/25/2020 2000)          

## 2020-08-24 LAB — PREPARE RBC (CROSSMATCH)

## 2020-08-24 LAB — RENAL FUNCTION PANEL
Albumin: 1.8 g/dL — ABNORMAL LOW (ref 3.5–5.0)
Anion gap: 15 (ref 5–15)
BUN: 56 mg/dL — ABNORMAL HIGH (ref 6–20)
CO2: 20 mmol/L — ABNORMAL LOW (ref 22–32)
Calcium: 6.2 mg/dL — CL (ref 8.9–10.3)
Chloride: 103 mmol/L (ref 98–111)
Creatinine, Ser: 6.79 mg/dL — ABNORMAL HIGH (ref 0.44–1.00)
GFR calc Af Amer: 7 mL/min — ABNORMAL LOW (ref >60)
GFR calc non Af Amer: 6 mL/min — ABNORMAL LOW (ref >60)
Glucose, Bld: 90 mg/dL (ref 70–99)
Phosphorus: 4.2 mg/dL (ref 2.5–4.6)
Potassium: 3.6 mmol/L (ref 3.5–5.1)
Sodium: 138 mmol/L (ref 135–145)

## 2020-08-24 LAB — GLUCOSE, CAPILLARY
Glucose-Capillary: 79 mg/dL (ref 70–99)
Glucose-Capillary: 79 mg/dL (ref 70–99)

## 2020-08-24 LAB — CBC
HCT: 21.1 % — ABNORMAL LOW (ref 36.0–46.0)
HCT: 22 % — ABNORMAL LOW (ref 36.0–46.0)
Hemoglobin: 7 g/dL — ABNORMAL LOW (ref 12.0–15.0)
Hemoglobin: 7.3 g/dL — ABNORMAL LOW (ref 12.0–15.0)
MCH: 30 pg (ref 26.0–34.0)
MCH: 29.2 pg (ref 26.0–34.0)
MCHC: 33.2 g/dL (ref 30.0–36.0)
MCHC: 33.2 g/dL (ref 30.0–36.0)
MCV: 90.6 fL (ref 80.0–100.0)
MCV: 88 fL (ref 80.0–100.0)
Platelets: 182 10*3/uL (ref 150–400)
Platelets: 171 10*3/uL (ref 150–400)
RBC: 2.33 MIL/uL — ABNORMAL LOW (ref 3.87–5.11)
RBC: 2.5 MIL/uL — ABNORMAL LOW (ref 3.87–5.11)
RDW: 20.4 % — ABNORMAL HIGH (ref 11.5–15.5)
RDW: 18.9 % — ABNORMAL HIGH (ref 11.5–15.5)
WBC: 12.9 10*3/uL — ABNORMAL HIGH (ref 4.0–10.5)
WBC: 12.1 10*3/uL — ABNORMAL HIGH (ref 4.0–10.5)
nRBC: 0 % (ref 0.0–0.2)
nRBC: 0 % (ref 0.0–0.2)

## 2020-08-24 LAB — HEMOGLOBIN AND HEMATOCRIT, BLOOD
HCT: 23.1 % — ABNORMAL LOW (ref 36.0–46.0)
Hemoglobin: 7.9 g/dL — ABNORMAL LOW (ref 12.0–15.0)

## 2020-08-24 LAB — MAGNESIUM: Magnesium: 1.7 mg/dL (ref 1.7–2.4)

## 2020-08-24 MED ORDER — LACTATED RINGERS IV SOLN: INTRAVENOUS | Status: DC

## 2020-08-24 MED ORDER — AMLODIPINE BESYLATE 10 MG PO TABS
10.0000 mg | ORAL_TABLET | Freq: Every day | ORAL | Status: DC
  Administered 2020-08-24 – 2020-08-31 (×8): 10 mg via ORAL
  Filled 2020-08-24 (×10): qty 1

## 2020-08-24 MED ORDER — BOOST / RESOURCE BREEZE PO LIQD CUSTOM: 1.0000 | Freq: Three times a day (TID) | ORAL | Status: DC

## 2020-08-24 MED ORDER — SODIUM CHLORIDE 0.9% IV SOLUTION: Freq: Once | INTRAVENOUS | Status: AC

## 2020-08-24 MED ORDER — CALCIUM GLUCONATE-NACL 2-0.675 GM/100ML-% IV SOLN
2.0000 g | Freq: Once | INTRAVENOUS | Status: AC
  Administered 2020-08-24: 2000 mg via INTRAVENOUS
  Filled 2020-08-24: qty 100

## 2020-08-24 MED ORDER — MAGNESIUM SULFATE IN D5W 1-5 GM/100ML-% IV SOLN
1.0000 g | Freq: Once | INTRAVENOUS | Status: AC
  Administered 2020-08-24: 1 g via INTRAVENOUS
  Filled 2020-08-24: qty 100

## 2020-08-24 MED ORDER — SODIUM CHLORIDE 0.9% FLUSH
10.0000 mL | INTRAVENOUS | Status: DC | PRN
  Administered 2020-08-26: 20 mL
  Administered 2020-08-29 – 2020-09-02 (×3): 10 mL

## 2020-08-24 MED ORDER — SODIUM CHLORIDE 0.9% FLUSH
10.0000 mL | Freq: Two times a day (BID) | INTRAVENOUS | Status: DC
  Administered 2020-08-26 – 2020-09-14 (×9): 10 mL

## 2020-08-24 NOTE — Plan of Care (Signed)
  Problem: Education: Goal: Knowledge of General Education information will improve Description Including pain rating scale, medication(s)/side effects and non-pharmacologic comfort measures Outcome: Progressing   

## 2020-08-24 NOTE — Progress Notes (Signed)
RN and NT went to change patient. Patient is still  releasing large blood clots from the vaginal area. MD page notified , awaiting new orders.

## 2020-08-24 NOTE — Progress Notes (Signed)
Patient still having vaginal bleeding and and this morning moderate amount of blood clots were found in her perineum while performing peri care on patient. Patient is asymptomatic at this time. [place], MD notified and orders received in [place]. Will continue to monitor.    Brenn Gatton,RN.

## 2020-08-24 NOTE — Progress Notes (Addendum)
Subjective:   Today, Cristina Jordan states she is starting to feel better. She and nurse report large vaginal blood clot the size of a fist overnight, but nursing state that when they changed her this morning, she had a more moderate flow without clots. Ms. Chou says her diarrhea has slowed down and her nausea has improved (despite continued vomiting). She says she wants water while in the room and does have some appetite. Her abdominal pain is still intermittently present, although not currently. She denies any heart racing, chest pain, SOB, new or worsening swelling, fevers, chills, or other symptoms.   She notes she would like to have the COVID vaccine once nausea improves and she is more stable.   Objective:  Vital signs in last 24 hours: Vitals:   08/23/20 0900 08/23/20 1646 08/23/20 2213 08/24/20 0454  BP: 134/81 (!) 145/93 127/84 (!) 151/94  Pulse: 99 (!) 101 100 98  Resp: 18 18 18 18   Temp: 98.4 F (36.9 C) 98.9 F (37.2 C) 98.2 F (36.8 C) 98.3 F (36.8 C)  TempSrc:  Oral Oral Oral  SpO2: 100% 100% 100% 99%  Weight:    (!) 159.7 kg  Height:       General: Patient is morbidly obese. Resting comfortably in NAD. Eyes: Sclera non-icteric. No conjunctival injection. Respiratory: Anterior lung sounds decreased but otherwise CTA, bilaterally. Cardiovascular: Rate is tachycardic. rhythm is regular. No murmurs, rubs, or gallops. There is 1+ bilateral lower extremity pitting edema. Neurological: Patient has R-sided hemiparesis.  GU: Not assessed as nursing staff just changed her, reporting improved bleeding without clots. Abdominal: Soft without TTP, guarding, or rebound. Bowel sounds intact. Genitourinary: Foley is in place, draining urine. Skin: Healed, old midline scar on abdomen. No rashes noted.    Assessment/Plan:  Active Problems:   Rhabdomyolysis   Hypokalemia   Hypocalcemia   Acute renal failure (HCC)   Malnutrition of moderate degree   Palliative care by  specialist   DNR (do not resuscitate) discussion   Lethargy   DNR (do not resuscitate)   Weakness generalized  # Colitis, Upper abdominal pain, with Refractory Nausea Patient presented with ABD pain, N/V, decreased PO intake x 2+ weeks with about 1 week non-bloody diarrhea that started 1 day PTA. CT abdomen pelvis suggestive of diffuse colonic wall thickening with pericolonic edema involving the descending/sigmoid colon suspicious for colitis. Initial WBC 15.1, LA 1.3. Flex sig 9/10 showed no obvious signs of IBD, although biopsy was obtained. EGD showed a 4cm hiatal hernia with erythema of the gastric body/antrum, duodenitis with multiple non-bleeding ulcers. - C. Diff and GI path PCR panel negative, unlikely infectious colitis - follow up biopsy results 08/25/20 to r/o IBD - consider ischemic colitis and ischemic cause for multiple duodenal ulcers - continue IV PPI BID - will hold on Imodium given GI recs and improved diarrhea - Continue vitamin B12 1023mcg x 7 days - Emetrol BID PRN and scopolamine patch for nausea  - Will advance diet as tolerated to renal without fluid restriction   # Severe AKI on likely Chronic Renal Failure stage IIIb with Resolved Metabolic Acidosis  Creatinine on arrival was 10.23 (up from 1.7 in 2019), BUN 73. Improving slowly to 6.79 and 56, respectively. GFR 4 --> 7, previous 36 in 2019. Likely prerenal due to prolonged poor PO intake and diarrhea. Renal Ultrasound showed increased echogenicity consistent with medical renal disease. Bicarb low at 20 today. - Nephrology signed off given stability/improvement; however, patient is a poor  dialysis candidate. Will continue goals of care discussions with patient and family. - Renal function and urine output continue to improve; will hold off on IVF for now given net + intake/ouput, but will likely resume LR following blood transfusion unless volume up - Will resume sodium bicarb 650mg  TID tomorrow if bicarb < 20 - Strict  I&O - Continue to monitor daily renal function panel   # Electrolyte Abnormalities with Moderate Malnutrition Patient has only been consuming broth for at least 2 weeks at home with N/V/D. Nutrition endorses 22% weight loss over past 3 months.  Potassium on arrival was less than 2, corrected calcium was 6.8, magnesium was 1.4 likely secondary to poor p.o. intake, nausea vomiting and diarrhea. Patient has some PVC's on telemetry today with continued prolonged QTc. IJ tunnel cath placed 5/88/32 without complication after patient lost multiple peripheral IV's. - Potassium - 3.6 - Corrected calcium - 8.0 - Magnesium - 1.7  - Will give 2g calcium gluconate and 1g magnesium - Continue to encourage PO intake with advancing diet as tolerated; ordered Boost Breeze as tolerated - Continue cardiac monitoring, monitoring QTc and PVC's  - Continue to monitor RFP  # Stable Normocytic Anemia Initial Hgb 8.3; however, due to vaginal bleeding, patient required single RBC transfusion 08/21/20. Hemoglobin 7.7 --> 7.0 today. Iron panel WNL except for high TIBC, although ferritin 86 may be falsely elevated. TVUS 08/23/20 showed thickened edometrium, 56mm, no fibroids; ovaries unable to be visualized. Patient has had poor OB-GYN follow up in past, concerning for possible endometrial carcinoma. Vaginal bleeding heavy overnight, with morning CBC 7.0 today. Patient tachycardic but otherwise stable. - Will order 1 unit PRBC today - Will monitor post-transfusion CBC / vaginal bleeding - Will hold off on iron for now given GI inflammation though will likely require this vs. EPO in future - On SCDs for DVT PPx until bleeding resolves - Patient will likely require endometrial bx to r/o carcinoma unless decision is made to pursue hospice.  # Hypertension  Blood pressures remain elevated to 549'I systolic on home amlodipine 5mg . - Will increase amlodipine from 5 to 10mg  daily - If nausea does not improve, will start IV  Labetalol as needed for <180/110  Prior to Admission Living Arrangement: Home  Anticipated Discharge Location: SNF vs. Hospice  Barriers to Discharge: Electrolyte instability, Poor renal function, Active Colitis  Code Status: DNR/DNI Diet: advanced as tolerated; renal without fluid restriction IVF: discontinued today DVT PPx: SCD's given vaginal bleeding  Addendum 08/24/20 @ 3:05pm Will start patient on 100cc's LR given reported limited PO intake and urine output so far today.   Jeralyn Bennett, MD 08/24/2020, 8:38 AM Pager: [phone] After 5pm on weekdays and 1pm on weekends: On Call pager [phone]

## 2020-08-25 ENCOUNTER — Other Ambulatory Visit: Payer: Self-pay | Admitting: Physician Assistant

## 2020-08-25 DIAGNOSIS — R109 Unspecified abdominal pain: Secondary | ICD-10-CM

## 2020-08-25 DIAGNOSIS — K269 Duodenal ulcer, unspecified as acute or chronic, without hemorrhage or perforation: Secondary | ICD-10-CM

## 2020-08-25 LAB — CBC
HCT: 21.6 % — ABNORMAL LOW (ref 36.0–46.0)
HCT: 22.6 % — ABNORMAL LOW (ref 36.0–46.0)
Hemoglobin: 7.1 g/dL — ABNORMAL LOW (ref 12.0–15.0)
Hemoglobin: 7.6 g/dL — ABNORMAL LOW (ref 12.0–15.0)
MCH: 29.8 pg (ref 26.0–34.0)
MCH: 30.2 pg (ref 26.0–34.0)
MCHC: 32.9 g/dL (ref 30.0–36.0)
MCHC: 33.6 g/dL (ref 30.0–36.0)
MCV: 90.8 fL (ref 80.0–100.0)
MCV: 89.7 fL (ref 80.0–100.0)
Platelets: 161 10*3/uL (ref 150–400)
Platelets: 176 10*3/uL (ref 150–400)
RBC: 2.38 MIL/uL — ABNORMAL LOW (ref 3.87–5.11)
RBC: 2.52 MIL/uL — ABNORMAL LOW (ref 3.87–5.11)
RDW: 19.1 % — ABNORMAL HIGH (ref 11.5–15.5)
RDW: 19.2 % — ABNORMAL HIGH (ref 11.5–15.5)
WBC: 11.3 10*3/uL — ABNORMAL HIGH (ref 4.0–10.5)
WBC: 12 10*3/uL — ABNORMAL HIGH (ref 4.0–10.5)
nRBC: 0 % (ref 0.0–0.2)
nRBC: 0 % (ref 0.0–0.2)

## 2020-08-25 LAB — GLUCOSE, CAPILLARY
Glucose-Capillary: 87 mg/dL (ref 70–99)
Glucose-Capillary: 94 mg/dL (ref 70–99)
Glucose-Capillary: 80 mg/dL (ref 70–99)

## 2020-08-25 LAB — RENAL FUNCTION PANEL
Albumin: 1.6 g/dL — ABNORMAL LOW (ref 3.5–5.0)
Anion gap: 13 (ref 5–15)
BUN: 54 mg/dL — ABNORMAL HIGH (ref 6–20)
CO2: 23 mmol/L (ref 22–32)
Calcium: 6.5 mg/dL — ABNORMAL LOW (ref 8.9–10.3)
Chloride: 102 mmol/L (ref 98–111)
Creatinine, Ser: 6.31 mg/dL — ABNORMAL HIGH (ref 0.44–1.00)
GFR calc Af Amer: 8 mL/min — ABNORMAL LOW (ref >60)
GFR calc non Af Amer: 7 mL/min — ABNORMAL LOW (ref >60)
Glucose, Bld: 95 mg/dL (ref 70–99)
Phosphorus: 3.9 mg/dL (ref 2.5–4.6)
Potassium: 3.2 mmol/L — ABNORMAL LOW (ref 3.5–5.1)
Sodium: 138 mmol/L (ref 135–145)

## 2020-08-25 LAB — MAGNESIUM: Magnesium: 1.8 mg/dL (ref 1.7–2.4)

## 2020-08-25 LAB — SURGICAL PATHOLOGY

## 2020-08-25 MED ORDER — PANTOPRAZOLE SODIUM 40 MG PO TBEC
40.0000 mg | DELAYED_RELEASE_TABLET | Freq: Two times a day (BID) | ORAL | Status: DC
  Administered 2020-08-25 – 2020-08-31 (×10): 40 mg via ORAL
  Filled 2020-08-25 (×11): qty 1

## 2020-08-25 MED ORDER — MEGESTROL ACETATE 40 MG PO TABS
40.0000 mg | ORAL_TABLET | Freq: Two times a day (BID) | ORAL | Status: DC
  Administered 2020-08-25 – 2020-08-31 (×10): 40 mg via ORAL
  Filled 2020-08-25 (×16): qty 1

## 2020-08-25 MED ORDER — POTASSIUM CHLORIDE 10 MEQ/100ML IV SOLN
10.0000 meq | INTRAVENOUS | Status: AC
  Administered 2020-08-25 (×3): 10 meq via INTRAVENOUS
  Filled 2020-08-25 (×3): qty 100

## 2020-08-25 MED ORDER — PROSOURCE PLUS PO LIQD
30.0000 mL | Freq: Two times a day (BID) | ORAL | Status: DC
  Filled 2020-08-25 (×4): qty 30

## 2020-08-25 MED ORDER — CALCIUM GLUCONATE-NACL 2-0.675 GM/100ML-% IV SOLN
2.0000 g | Freq: Once | INTRAVENOUS | Status: AC
  Administered 2020-08-25: 2000 mg via INTRAVENOUS
  Filled 2020-08-25: qty 100

## 2020-08-25 NOTE — Progress Notes (Signed)
Subjective:   Patient complains of continued abdominal pain and vaginal bleeding, which are roughly the same as yesterday. Complains of lightheadedness and mild chest pain with exertion that she attributes to her blood loss, and resolves with rest. She states her nausea is improving and she denies vomiting. She says she would like to try to eat today. She notes her diarrhea is improving. She denies palpitations, SOB, weakness, or swelling.   Objective:  Vital signs in last 24 hours: Vitals:   08/24/20 1657 08/24/20 2136 08/25/20 0624 08/25/20 0912  BP: 130/87 133/83 134/85 140/90  Pulse: 100 98 94 99  Resp: 18 18 18 18   Temp: 98.2 F (36.8 C) 98 F (36.7 C) 97.9 F (36.6 C) 98 F (36.7 C)  TempSrc: Oral   Oral  SpO2: 100% 100% 99% 100%  Weight:      Height:       General: Patient is morbidly obese. Resting comfortably in NAD. Eyes: Sclera non-icteric. No conjunctival injection. HENT: Moist mucus membranes.  Respiratory: Anterior lung sounds CTA bilaterally without wheezing or rhonchi. Cardiovascular: Rate is normal. rhythm is regular. No murmurs, rubs, or gallops. There is trace lower extremity pitting edema, improved since yesterday.  Neurological: Patient has R-sided hemiparesis.  GU: There is moderate vaginal bleeding with small clots.  Abdominal: Soft without TTP, guarding, or rebound. No distention. Bowel sounds intact. Genitourinary: Foley is in place, draining urine. Skin: Healed, old midline scar on abdomen. No rashes noted.    Assessment/Plan:  Active Problems:   Rhabdomyolysis   Hypokalemia   Hypocalcemia   Acute renal failure (HCC)   Malnutrition of moderate degree   Palliative care by specialist   DNR (do not resuscitate) discussion   Lethargy   DNR (do not resuscitate)   Weakness generalized  # Colitis, Upper abdominal pain, with Refractory Nausea Patient presented with ABD pain, N/V, decreased PO intake x 2+ weeks with about 1 week non-bloody diarrhea  that started 1 day PTA. CT abdomen pelvis suggestive of diffuse colonic wall thickening with pericolonic edema involving the descending/sigmoid colon suspicious for colitis. Initial WBC 15.1, LA 1.3. Flex sig 9/10 showed no obvious signs of IBD, although biopsy was obtained. EGD showed a 4cm hiatal hernia with erythema of the gastric body/antrum, duodenitis with multiple non-bleeding ulcers. - C. Diff and GI path PCR panel negative, unlikely infectious colitis - follow up biopsy results 08/25/20 to r/o IBD - consider ischemic colitis and ischemic cause for multiple duodenal ulcers - continue IV PPI BID - will hold on Imodium given GI recs and improved diarrhea - Continue vitamin B12 1075mcg x 7 days - Emetrol BID PRN and scopolamine patch for nausea  - Will advance diet as tolerated to renal without fluid restriction   # Severe AKI on likely Chronic Renal Failure stage IIIb with Resolved Metabolic Acidosis  Creatinine on arrival was 10.23 (up from 1.7 in 2019), BUN 73. Improving slowly to 6.31 and 54, respectively. GFR 4 --> 8, previous 36 in 2019. Likely prerenal due to prolonged poor PO intake and diarrhea. Renal Ultrasound showed increased echogenicity consistent with medical renal disease. Bicarb WNL today. Currently euvolemic. - Nephrology signed off given stability/improvement; however, patient is a poor dialysis candidate. PT and OT recommend SNF placement - Ordered TOC SNF placement consult; dispo will need to be discussed with family - Renal function and urine output continue to improve; will decrease LR to 13mL/hr and continue to monitor.  - Will resume sodium bicarb 650mg  three  times daily if bicarb < 20 - Strict I&O - Continue to monitor daily renal function panel   # Electrolyte Abnormalities with Moderate Malnutrition Patient has only been consuming broth for at least 2 weeks at home with N/V/D. Nutrition endorses 22% weight loss over past 3 months.  Potassium on arrival was less  than 2, corrected calcium was 6.8, magnesium was 1.4 likely secondary to poor p.o. intake, nausea vomiting and diarrhea. Patient has some PVC's on telemetry today with continued prolonged QTc. IJ tunnel cath placed 4/74/25 without complication after patient lost multiple peripheral IV's. - Potassium - 3.2, gave 59mEq KCl IV  - Corrected calcium - 8.4, discontinued TUMS, gave 2g calcium gluconate  - Magnesium - 1.8 - Continue to encourage PO intake with advancing diet as tolerated - Per nutritionist, patient refusing Boost Breeze supplementation; will start Prosource  - Will start on Ensure Enlive once diet is advanced  - Continue cardiac monitoring, monitoring QTc and PVC's  - Continue to monitor RFP  # Stable Normocytic Anemia Initial Hgb 8.3; however, due to vaginal bleeding, patient required single RBC transfusion 08/21/20. Hemoglobin remains stable today. Iron panel WNL except for high TIBC, although ferritin 86 may be falsely elevated. TVUS 08/23/20 showed thickened edometrium, 58mm, no fibroids; ovaries unable to be visualized. Patient has had poor OB-GYN follow up in past, concerning for possible endometrial carcinoma.  - OB-GYN consulted; appreciate their recommendations - Patient started on megestrol 40mg  PO twice daily  - Will schedule outpatient endometrial biopsy at [facility] [Dr. Emeterio Reeve will message the office] if kidney function continues to improve - Will hold off on iron for now given GI inflammation though will likely require this vs. EPO in future - On SCDs for DVT PPx until bleeding resolves  # Hypertension  Blood pressures now stable in the 956'L to 875 systolic on increased dose of amlodipine.  - Continue amlodipine 10mg  daily   Prior to Admission Living Arrangement: Home  Anticipated Discharge Location: SNF vs. Home  Barriers to Discharge: Improving renal function, decreased PO intake, Active Colitis  Code Status: DNR/DNI Diet: advanced as  tolerated; renal without fluid restriction IVF: LR 105mL/hr DVT PPx: SCD's given vaginal bleeding  Jeralyn Bennett, MD 08/25/2020, 2:31 PM Pager: [phone] After 5pm on weekdays and 1pm on weekends: On Call pager [phone]

## 2020-08-25 NOTE — Anesthesia Postprocedure Evaluation (Signed)
Anesthesia Post Note  Patient: Cristina Jordan  Procedure(s) Performed: FLEXIBLE SIGMOIDOSCOPY (N/A ) ESOPHAGOGASTRODUODENOSCOPY (EGD) WITH PROPOFOL (N/A ) BIOPSY     Patient location during evaluation: PACU Anesthesia Type: General Level of consciousness: sedated and patient cooperative Pain management: pain level controlled Vital Signs Assessment: post-procedure vital signs reviewed and stable Respiratory status: spontaneous breathing Cardiovascular status: stable Anesthetic complications: no   No complications documented.  Last Vitals:  Vitals:   08/24/20 2136 08/25/20 0624  BP: 133/83 134/85  Pulse: 98 94  Resp: 18 18  Temp: 36.7 C 36.6 C  SpO2: 100% 99%    Last Pain:  Vitals:   08/24/20 2000  TempSrc:   PainSc: 0-No pain                 Nolon Nations

## 2020-08-25 NOTE — Progress Notes (Signed)
Daily Rounding Note  08/25/2020, 2:20 PM  LOS: 9 days   SUBJECTIVE:   Chief complaint:  N, V, diarrhea, abd pain.     Tolerating po of clears, no vomiting Still c/o diffuse abd pain.   Stools brown  OBJECTIVE:         Vital signs in last 24 hours:    Temp:  [97.9 F (36.6 C)-98.2 F (36.8 C)] 98 F (36.7 C) (09/13 0912) Pulse Rate:  [94-100] 99 (09/13 0912) Resp:  [16-18] 18 (09/13 0912) BP: (130-140)/(83-90) 140/90 (09/13 0912) SpO2:  [99 %-100 %] 100 % (09/13 0912) Last BM Date: 08/24/20 Filed Weights   09/08/2020 2059 08/18/20 0500 08/24/20 0454  Weight: 133 kg (!) 137.9 kg (!) 159.7 kg   General: obese, chron ill   Heart: RRR Chest: clear bil Abdomen: soft, mild tenderness x 4 quads, non-focal.  BS active  Extremities: + edema Neuro/Psych:  Alert.  Appropriate.  Right sided weakness.    Intake/Output from previous day: 09/12 0701 - 09/13 0700 In: 1864.5 [P.O.:360; I.V.:1101.8; Blood:294; IV Piggyback:108.8] Out: 2500 [Urine:2500]  Intake/Output this shift: Total I/O In: 998.3 [P.O.:360; I.V.:247; IV Piggyback:391.3] Out: 400 [Urine:400]  Lab Results: Recent Labs    08/24/20 2218 08/25/20 0416 08/25/20 1230  WBC 12.1* 11.3* 12.0*  HGB 7.3* 7.1* 7.6*  HCT 22.0* 21.6* 22.6*  PLT 171 161 176   BMET Recent Labs    08/23/20 0414 08/24/20 0718 08/25/20 0416  NA 137 138 138  K 3.9 3.6 3.2*  CL 98 103 102  CO2 24 20* 23  GLUCOSE 89 90 95  BUN 59* 56* 54*  CREATININE 7.33* 6.79* 6.31*  CALCIUM 6.4* 6.2* 6.5*   LFT Recent Labs    08/23/20 0414 08/24/20 0718 08/25/20 0416  ALBUMIN 1.9* 1.8* 1.6*   PT/INR No results for input(s): LABPROT, INR in the last 72 hours. Hepatitis Panel No results for input(s): HEPBSAG, HCVAB, HEPAIGM, HEPBIGM in the last 72 hours.  Studies/Results: US PELVIS LIMITED (TRANSABDOMINAL ONLY)  Result Date: 08/23/2020 CLINICAL DATA:  Vaginal bleeding. EXAM:  TRANSABDOMINAL AND TRANSVAGINAL ULTRASOUND OF PELVIS TECHNIQUE: Both transabdominal and transvaginal ultrasound examinations of the pelvis were performed. Transabdominal technique was performed for global imaging of the pelvis including uterus, ovaries, adnexal regions, and pelvic cul-de-sac. It should be noted that the transvaginal portion of the study was incomplete due to the patient's body habitus and immobile state. Transvaginal imaging of the cervix is noted by the ultrasound technologist. COMPARISON:  None FINDINGS: Uterus Measurements: 11.3 cm x 6.7 cm x 6.2 cm = volume: 244.77 mL. No fibroids or other mass visualized. Endometrium Thickness: 20 mm.  No focal abnormality visualized. Right ovary The right ovary is not visualized. Left ovary The left ovary is not visualized. Other findings No abnormal free fluid. IMPRESSION: 1. Thickened endometrium which may represent sequelae associated with endometrial hyperplasia. 2. Limited study, as described above, with nonvisualization of the ovaries. Electronically Signed   By: Virgina Norfolk M.D.   On: 08/23/2020 22:08   Scheduled Meds: . (feeding supplement) PROSource Plus  30 mL Oral BID BM  . amLODipine  10 mg Oral Daily  . Chlorhexidine Gluconate Cloth  6 each Topical Daily  . cyanocobalamin  1,000 mcg Intramuscular Daily  . megestrol  40 mg Oral BID  . pantoprazole (PROTONIX) IV  40 mg Intravenous BID  . scopolamine  1 patch Transdermal Q72H  . sodium chloride flush  10-40 mL  Intracatheter Q12H  . sodium chloride flush  3 mL Intravenous Q12H   Continuous Infusions: . sodium chloride    . lactated ringers 75 mL/hr at 08/25/20 1257   PRN Meds:.sodium chloride, acetaminophen, anti-nausea, lidocaine-EPINEPHrine, sodium chloride flush   Scheduled Meds: . (feeding supplement) PROSource Plus  30 mL Oral BID BM  . amLODipine  10 mg Oral Daily  . Chlorhexidine Gluconate Cloth  6 each Topical Daily  . cyanocobalamin  1,000 mcg Intramuscular Daily   . megestrol  40 mg Oral BID  . pantoprazole (PROTONIX) IV  40 mg Intravenous BID  . scopolamine  1 patch Transdermal Q72H  . sodium chloride flush  10-40 mL Intracatheter Q12H  . sodium chloride flush  3 mL Intravenous Q12H   Continuous Infusions: . sodium chloride    . lactated ringers 75 mL/hr at 08/25/20 1257   PRN Meds:.sodium chloride, acetaminophen, anti-nausea, lidocaine-EPINEPHrine, sodium chloride flush   ASSESMENT:   *    N/V/D, abdominal pain. 08/21/2020 CTAP with diffuse colonic wall thickening, pericolonic edema involving descending and sigmoid colon, suspicious for colitis. 09/06/2020 EGD: Distal esophagus tortuous.  4 cm HH.  Gastric body and antral erythema.  Nonbleeding, clean-based duodenal ulcers.  Biopsies of stomach and duodenum pndg Protonix 40 mg IV bid in place.   09/10/2020 flexible sigmoidoscopy.  Stool throughout examined colon despite tapwater enema preop.  After lavage the mucosa appeared normal, biopsied obtained.  No gross evidence for IBD.  Rectosigmoid, sigmoid diverticulosis.  Nonbleeding, nonthrombosed external and internal hemorrhoids.  Suspect the colitis seen on initial CT may have resulted from ischemia.  *    Vaginal bleeding in postmenopausal pt. thickened endometrium on imaging.  Megace initiated and likely to undergo outpatient endometrial biopsy per Dr. Emeterio Reeve.  *   Nonsevere, moderate malnutrition in the setting of chronic illness.  RD has added Prosource plus and recommends initiating Ensure tid.    *    Normocytic anemia.  Hb 6.6 >> 7/6.  Sp PRBC x 1 on 9/7.   Ferritin 86, iron 64.  Normal iron saturation.  TIBC low at 214.  B12 low at 92.  IM cyanocobalamin added.  *   AKI.  Improved.  Not a candidate for HD.  Renal has signed off.     PLAN   *   Follow-up EGD in 3 or more months to assure healing of ulcers.  *    Awaiting reading of the pathology from the upper endoscopy and flexible sigmoidoscopy.  *    Should stay on twice  daily Protonix 40 mg bid po 1 month then convert to 40 mg/day.    *   Advanced to [place] diet.      Cristina Jordan  08/25/2020, 2:20 PM Phone [phone]

## 2020-08-25 NOTE — Progress Notes (Signed)
Nutrition Follow-up  DOCUMENTATION CODES:   Non-severe (moderate) malnutrition in context of chronic illness  INTERVENTION:  106ml Prosource PLUS po BID, each supplement provides 100 kcal and 15 grams of protein  D/c Boost Breeze  Once diet is advanced, recommend Ensure Enlive po TID, each supplement provides 350 kcal and 20 grams of protein   NUTRITION DIAGNOSIS:   Moderate Malnutrition related to chronic illness (chronic renal insufficiency) as evidenced by percent weight loss, mild muscle depletion, energy intake < 75% for > or equal to 1 month (22% weight loss x 3 months).  ongoing  GOAL:   Patient will meet greater than or equal to 90% of their needs  Progressing  MONITOR:   PO intake, Supplement acceptance, Diet advancement, Skin, Labs  REASON FOR ASSESSMENT:   Malnutrition Screening Tool    ASSESSMENT:   59 yo female admitted with severe AKI, hypokalemia, hypocalcemia, hypomagnesemia. PMH includes CVA with residual R sided paralysis, wheelchair bound, HTN, chronic renal insufficiency.  9/10 s/p EGD and flexible sigmoidoscopy  Per MD, a widened endometrial stripe has been identified and vaginal bleeding continues necessitating additional blood transfusion. Pt noted to likely require endometrial bx to r/o carcinoma unless hospice is pursued.   Pt's renal function and urine output are noted to be improving. Pt remains a poor dialysis candidate per MD. [place] discussions continue to be held with pt and family.  Discussed pt with RN. Per RN, pt has tolerated clear liquid diet well and may have her diet advanced today. Pt noted to be refusing Boost Breeze supplements. Will discontinue these and order Prosource instead. Pt should have Ensure Enlive once diet is advanced to provide more kcals/protein.   Labs: K+ 3.2 (L), Corrected Ca 8.42 (L) Medications: Vitamin B-12, Boost Breeze TID, Protonix, Ca Gluconate 2g  Diet Order:   Diet Order            Diet clear liquid  Room service appropriate? Yes; Fluid consistency: Thin  Diet effective now                 EDUCATION NEEDS:   Not appropriate for education at this time  Skin:  Skin Assessment: Reviewed RN Assessment (MASD; fissure to buttocks)  Last BM:  9/12  Height:   Ht Readings from Last 1 Encounters:  08/30/2020 5' 6.5" (1.689 m)    Weight:   Wt Readings from Last 1 Encounters:  08/24/20 (!) 159.7 kg    Ideal Body Weight:  60.2 kg  BMI:  Body mass index is 55.96 kg/m.  Estimated Nutritional Needs:   Kcal:  1900-2200  Protein:  100-130 gm  Fluid:  1.9-2.2 L    Larkin Ina, MS, RD, LDN RD pager number and weekend/on-call pager number located in [place].

## 2020-08-25 NOTE — Consult Note (Signed)
Reason for Consult:postmenopausal vaginal bleeding Referring Physician: Jeralyn Bennett, MD  Cristina Jordan is an 59 y.o. female. G4P4, last pregnancy 32 years ago. She is admitted to renal service with ARF, colitis and weakness. She states she has been postmenopausal for 10 years and vaginal bleeding began after she was admitted 08/20/2020. She still notes bleeding and she has passed some clots. She has not seen anyone for gyn care for many years.  Pertinent Gynecological 2History: Menses: post-menopausal Bleeding: post menopausal bleeding Contraception: none Blood transfusions: yes Previous GYN Procedures: denies   Last pap: unknown     Menstrual History:  No LMP recorded. Patient is postmenopausal.    Past Medical History:  Diagnosis Date  . Hypertension   . Stroke [facility])     Past Surgical History:  Procedure Laterality Date  . CHOLECYSTECTOMY      History reviewed. No pertinent family history.  Social History:  reports that she has never smoked. She has never used smokeless tobacco. She reports previous alcohol use. She reports that she does not use drugs.  Allergies: No Known Allergies  Medications: I have reviewed the patient's current medications.  Review of Systems  Constitutional: Positive for fatigue.  Respiratory: Negative.   Gastrointestinal: Positive for abdominal distention and abdominal pain. Negative for anal bleeding.  Genitourinary: Positive for menstrual problem and vaginal bleeding.    Blood pressure 140/90, pulse 99, temperature 98 F (36.7 C), temperature source Oral, resp. rate 18, height 5' 6.5" (1.689 m), weight (!) 159.7 kg, SpO2 100 %. Physical Exam Constitutional:      Appearance: She is obese.  HENT:     Head: Normocephalic.  Cardiovascular:     Rate and Rhythm: Normal rate.  Pulmonary:     Effort: Pulmonary effort is normal.  Abdominal:     General: There is no distension.  Skin:    General: Skin is warm and dry.  Neurological:      General: No focal deficit present.     Mental Status: She is alert and oriented to person, place, and time.  Psychiatric:        Mood and Affect: Mood normal.        Behavior: Behavior normal.     Results for orders placed or performed during the hospital encounter of 08/26/2020 (from the past 48 hour(s))  Glucose, capillary     Status: None   Collection Time: 08/24/20 12:11 AM  Result Value Ref Range   Glucose-Capillary 79 70 - 99 mg/dL    Comment: Glucose reference range applies only to samples taken after fasting for at least 8 hours.  Magnesium     Status: None   Collection Time: 08/24/20  7:18 AM  Result Value Ref Range   Magnesium 1.7 1.7 - 2.4 mg/dL    Comment: Performed at [facility] [address] [place], [place] [address]  CBC     Status: Abnormal   Collection Time: 08/24/20  7:18 AM  Result Value Ref Range   WBC 12.9 (H) 4.0 - 10.5 K/uL   RBC 2.33 (L) 3.87 - 5.11 MIL/uL   Hemoglobin 7.0 (L) 12.0 - 15.0 g/dL   HCT 21.1 (L) 36 - 46 %   MCV 90.6 80.0 - 100.0 fL   MCH 30.0 26.0 - 34.0 pg   MCHC 33.2 30.0 - 36.0 g/dL   RDW 20.4 (H) 11.5 - 15.5 %   Platelets 182 150 - 400 K/uL   nRBC 0.0 0.0 - 0.2 %  Comment: Performed at [facility], [place] [address] [place], [place] [address]  Renal function panel     Status: Abnormal   Collection Time: 08/24/20  7:18 AM  Result Value Ref Range   Sodium 138 135 - 145 mmol/L   Potassium 3.6 3.5 - 5.1 mmol/L   Chloride 103 98 - 111 mmol/L   CO2 20 (L) 22 - 32 mmol/L   Glucose, Bld 90 70 - 99 mg/dL    Comment: Glucose reference range applies only to samples taken after fasting for at least 8 hours.   BUN 56 (H) 6 - 20 mg/dL   Creatinine, Ser 6.79 (H) 0.44 - 1.00 mg/dL   Calcium 6.2 (LL) 8.9 - 10.3 mg/dL    Comment: CRITICAL RESULT CALLED TO, READ BACK BY AND VERIFIED WITH: RN C GENGLER AT 0854 08/24/20 BY L BENFIELD    Phosphorus 4.2 2.5 - 4.6 mg/dL   Albumin 1.8 (L) 3.5 - 5.0 g/dL   GFR calc non Af Amer 6  (L) >60 mL/min   GFR calc Af Amer 7 (L) >60 mL/min   Anion gap 15 5 - 15    Comment: Performed at [facility] [address] [place], [place] [address]  Prepare RBC (crossmatch)     Status: None   Collection Time: 08/24/20 10:45 AM  Result Value Ref Range   Order Confirmation      ORDER PROCESSED BY BLOOD BANK Performed at [facility], [place] [address], [place], [place] [address]   Type and screen [place]     Status: None (Preliminary result)   Collection Time: 08/24/20 10:54 AM  Result Value Ref Range   ABO/RH(D) O POS    Antibody Screen NEG    Sample Expiration 08/27/2020,2359    Unit Number [id number]    Blood Component Type RED CELLS,LR    Unit division 00    Status of Unit ISSUED    Transfusion Status OK TO TRANSFUSE    Crossmatch Result      Compatible Performed at [facility], [place] [address], [place], [place] [address]   Glucose, capillary     Status: None   Collection Time: 08/24/20 11:30 AM  Result Value Ref Range   Glucose-Capillary 79 70 - 99 mg/dL    Comment: Glucose reference range applies only to samples taken after fasting for at least 8 hours.  Hemoglobin and hematocrit, blood     Status: Abnormal   Collection Time: 08/24/20  4:15 PM  Result Value Ref Range   Hemoglobin 7.9 (L) 12.0 - 15.0 g/dL   HCT 23.1 (L) 36 - 46 %    Comment: Performed at [facility] [address] [place], [place] [address]  CBC     Status: Abnormal   Collection Time: 08/24/20 10:18 PM  Result Value Ref Range   WBC 12.1 (H) 4.0 - 10.5 K/uL   RBC 2.50 (L) 3.87 - 5.11 MIL/uL   Hemoglobin 7.3 (L) 12.0 - 15.0 g/dL   HCT 22.0 (L) 36 - 46 %   MCV 88.0 80.0 - 100.0 fL   MCH 29.2 26.0 - 34.0 pg   MCHC 33.2 30.0 - 36.0 g/dL   RDW 18.9 (H) 11.5 - 15.5 %   Platelets 171 150 - 400 K/uL   nRBC 0.0 0.0 - 0.2 %    Comment: Performed at [facility] [address] [place], [place] [address]  Glucose,  capillary     Status: None    Collection Time: 08/25/20 12:16 AM  Result Value Ref Range   Glucose-Capillary 94 70 - 99 mg/dL    Comment: Glucose reference range applies only to samples taken after fasting for at least 8 hours.   Comment 1 QC Due   Magnesium     Status: None   Collection Time: 08/25/20  4:16 AM  Result Value Ref Range   Magnesium 1.8 1.7 - 2.4 mg/dL    Comment: Performed at [facility] [address], [place], [place] [address]  Renal function panel     Status: Abnormal   Collection Time: 08/25/20  4:16 AM  Result Value Ref Range   Sodium 138 135 - 145 mmol/L   Potassium 3.2 (L) 3.5 - 5.1 mmol/L   Chloride 102 98 - 111 mmol/L   CO2 23 22 - 32 mmol/L   Glucose, Bld 95 70 - 99 mg/dL    Comment: Glucose reference range applies only to samples taken after fasting for at least 8 hours.   BUN 54 (H) 6 - 20 mg/dL   Creatinine, Ser 6.31 (H) 0.44 - 1.00 mg/dL   Calcium 6.5 (L) 8.9 - 10.3 mg/dL   Phosphorus 3.9 2.5 - 4.6 mg/dL   Albumin 1.6 (L) 3.5 - 5.0 g/dL   GFR calc non Af Amer 7 (L) >60 mL/min   GFR calc Af Amer 8 (L) >60 mL/min   Anion gap 13 5 - 15    Comment: Performed at [facility] [address], [place], [place] [address]  CBC     Status: Abnormal   Collection Time: 08/25/20  4:16 AM  Result Value Ref Range   WBC 11.3 (H) 4.0 - 10.5 K/uL   RBC 2.38 (L) 3.87 - 5.11 MIL/uL   Hemoglobin 7.1 (L) 12.0 - 15.0 g/dL   HCT 21.6 (L) 36 - 46 %   MCV 90.8 80.0 - 100.0 fL   MCH 29.8 26.0 - 34.0 pg   MCHC 32.9 30.0 - 36.0 g/dL   RDW 19.1 (H) 11.5 - 15.5 %   Platelets 161 150 - 400 K/uL   nRBC 0.0 0.0 - 0.2 %    Comment: Performed at [facility] [address], [place], [place] [address]  Glucose, capillary     Status: None   Collection Time: 08/25/20  7:53 AM  Result Value Ref Range   Glucose-Capillary 87 70 - 99 mg/dL    Comment: Glucose reference range applies only to samples taken after fasting for at least 8 hours.  CBC     Status: Abnormal   Collection Time:  08/25/20 12:30 PM  Result Value Ref Range   WBC 12.0 (H) 4.0 - 10.5 K/uL   RBC 2.52 (L) 3.87 - 5.11 MIL/uL   Hemoglobin 7.6 (L) 12.0 - 15.0 g/dL   HCT 22.6 (L) 36 - 46 %   MCV 89.7 80.0 - 100.0 fL   MCH 30.2 26.0 - 34.0 pg   MCHC 33.6 30.0 - 36.0 g/dL   RDW 19.2 (H) 11.5 - 15.5 %   Platelets 176 150 - 400 K/uL   nRBC 0.0 0.0 - 0.2 %    Comment: Performed at [facility] [address], [place], [place] [address]    US PELVIS LIMITED (TRANSABDOMINAL ONLY)  Result Date: 08/23/2020 CLINICAL DATA:  Vaginal bleeding. EXAM: TRANSABDOMINAL AND TRANSVAGINAL ULTRASOUND OF PELVIS TECHNIQUE: Both transabdominal and transvaginal ultrasound  examinations of the pelvis were performed. Transabdominal technique was performed for global imaging of the pelvis including uterus, ovaries, adnexal regions, and pelvic cul-de-sac. It should be noted that the transvaginal portion of the study was incomplete due to the patient's body habitus and immobile state. Transvaginal imaging of the cervix is noted by the ultrasound technologist. COMPARISON:  None FINDINGS: Uterus Measurements: 11.3 cm x 6.7 cm x 6.2 cm = volume: 244.77 mL. No fibroids or other mass visualized. Endometrium Thickness: 20 mm.  No focal abnormality visualized. Right ovary The right ovary is not visualized. Left ovary The left ovary is not visualized. Other findings No abnormal free fluid. IMPRESSION: 1. Thickened endometrium which may represent sequelae associated with endometrial hyperplasia. 2. Limited study, as described above, with nonvisualization of the ovaries. Electronically Signed   By: Virgina Norfolk M.D.   On: 08/23/2020 22:08    Assessment/Plan: Postmenopausal bleeding with thickened endometrium, no fibroid seen on ultrasound. She is at risk for hyperplasia and will need an endometrial biopsy which is best arranged as an outpatient. I will order megestrol 40 mg po BID to help stop her bleeding. We can see her at [facility] after discharge and I will message the office. Thank you for the consult. Emeterio Reeve MD FACOG 08/25/2020 Daytime weekdays [phone]
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
## 2020-08-26 ENCOUNTER — Encounter (HOSPITAL_COMMUNITY): Payer: Self-pay | Admitting: Gastroenterology

## 2020-08-26 DIAGNOSIS — K2950 Unspecified chronic gastritis without bleeding: Secondary | ICD-10-CM

## 2020-08-26 DIAGNOSIS — N939 Abnormal uterine and vaginal bleeding, unspecified: Secondary | ICD-10-CM

## 2020-08-26 DIAGNOSIS — K529 Noninfective gastroenteritis and colitis, unspecified: Secondary | ICD-10-CM

## 2020-08-26 LAB — RENAL FUNCTION PANEL
Albumin: 1.6 g/dL — ABNORMAL LOW (ref 3.5–5.0)
Anion gap: 11 (ref 5–15)
BUN: 49 mg/dL — ABNORMAL HIGH (ref 6–20)
CO2: 21 mmol/L — ABNORMAL LOW (ref 22–32)
Calcium: 6.6 mg/dL — ABNORMAL LOW (ref 8.9–10.3)
Chloride: 103 mmol/L (ref 98–111)
Creatinine, Ser: 5.81 mg/dL — ABNORMAL HIGH (ref 0.44–1.00)
GFR calc Af Amer: 9 mL/min — ABNORMAL LOW (ref >60)
GFR calc non Af Amer: 7 mL/min — ABNORMAL LOW (ref >60)
Glucose, Bld: 93 mg/dL (ref 70–99)
Phosphorus: 3.9 mg/dL (ref 2.5–4.6)
Potassium: 3.5 mmol/L (ref 3.5–5.1)
Sodium: 135 mmol/L (ref 135–145)

## 2020-08-26 LAB — GLUCOSE, CAPILLARY
Glucose-Capillary: 89 mg/dL (ref 70–99)
Glucose-Capillary: 94 mg/dL (ref 70–99)
Glucose-Capillary: 88 mg/dL (ref 70–99)
Glucose-Capillary: 101 mg/dL — ABNORMAL HIGH (ref 70–99)

## 2020-08-26 LAB — CBC
HCT: 20.3 % — ABNORMAL LOW (ref 36.0–46.0)
Hemoglobin: 6.7 g/dL — CL (ref 12.0–15.0)
MCH: 29.9 pg (ref 26.0–34.0)
MCHC: 33 g/dL (ref 30.0–36.0)
MCV: 90.6 fL (ref 80.0–100.0)
Platelets: 167 10*3/uL (ref 150–400)
RBC: 2.24 MIL/uL — ABNORMAL LOW (ref 3.87–5.11)
RDW: 18.7 % — ABNORMAL HIGH (ref 11.5–15.5)
WBC: 10.4 10*3/uL (ref 4.0–10.5)
nRBC: 0 % (ref 0.0–0.2)

## 2020-08-26 LAB — FOLATE RBC
Folate, Hemolysate: 180 ng/mL
Folate, RBC: 776 ng/mL (ref >498)
Hematocrit: 23.2 % — ABNORMAL LOW (ref 34.0–46.6)

## 2020-08-26 LAB — HEMOGLOBIN AND HEMATOCRIT, BLOOD
HCT: 24.1 % — ABNORMAL LOW (ref 36.0–46.0)
Hemoglobin: 8.1 g/dL — ABNORMAL LOW (ref 12.0–15.0)

## 2020-08-26 LAB — PREPARE RBC (CROSSMATCH)

## 2020-08-26 LAB — MAGNESIUM: Magnesium: 1.7 mg/dL (ref 1.7–2.4)

## 2020-08-26 MED ORDER — SODIUM CHLORIDE 0.9% IV SOLUTION: Freq: Once | INTRAVENOUS | Status: AC

## 2020-08-26 MED ORDER — DICYCLOMINE HCL 10 MG PO CAPS
10.0000 mg | ORAL_CAPSULE | Freq: Three times a day (TID) | ORAL | Status: AC
  Administered 2020-08-26 – 2020-08-28 (×5): 10 mg via ORAL
  Filled 2020-08-26 (×8): qty 1

## 2020-08-26 MED ORDER — CALCIUM GLUCONATE-NACL 2-0.675 GM/100ML-% IV SOLN
2.0000 g | Freq: Once | INTRAVENOUS | Status: AC
  Administered 2020-08-26: 2000 mg via INTRAVENOUS
  Filled 2020-08-26: qty 100

## 2020-08-26 MED ORDER — MAGNESIUM SULFATE IN D5W 1-5 GM/100ML-% IV SOLN
1.0000 g | Freq: Once | INTRAVENOUS | Status: AC
  Administered 2020-08-26: 1 g via INTRAVENOUS
  Filled 2020-08-26: qty 100

## 2020-08-26 NOTE — Plan of Care (Signed)
  Problem: Elimination: Goal: Will not experience complications related to bowel motility Outcome: Progressing   

## 2020-08-26 NOTE — Progress Notes (Signed)
Subjective:   Cristina Jordan states that she feels light-headed and weak today, which she attributes to her continued vaginal bleeding. She states that her diarrhea and nausea have improved, and she has not vomited. She is able to take her oral medications and notes improved appetite. She states that her abdominal pain continues intermittently but denies any current pain. She denies SOB, palpitations, swelling, or CP.   Objective:  Vital signs in last 24 hours: Vitals:   08/26/20 0414 08/26/20 0628 08/26/20 0655 08/26/20 0950  BP: 113/75 127/83 112/72 (!) 144/75  Pulse: 98 (!) 102 (!) 102 (!) 104  Resp: 18 16 17 16   Temp: 98.6 F (37 C) 98 F (36.7 C) 98.9 F (37.2 C) 98.6 F (37 C)  TempSrc: Oral Oral Oral Oral  SpO2: 98% 99% 98% 100%  Weight:      Height:       General: Patient is morbidly obese. Resting comfortably in NAD. Eyes: Sclera non-icteric. No conjunctival injection. HENT: Mucus membranes appear slightly dry. Respiratory: Anterior lung sounds CTA bilaterally without wheezing or rhonchi. Cardiovascular: Rate is slightly tachycardic. rhythm is regular. No murmurs, rubs, or gallops. There is very minimal lower extremity edema, improved since yesterday. Neurological: Patient has R-sided hemiparesis.  GU: Nursing report continued slow vaginal bleeding without large clots. Unable to be visualized.  Abdominal: Soft without TTP, guarding, or rebound. No distention. Bowel sounds intact. Genitourinary: Foley is in place, draining urine. Skin: Healed, old midline scar on abdomen.   Assessment/Plan:  Active Problems:   Rhabdomyolysis   Hypokalemia   Hypocalcemia   Acute renal failure (HCC)   Malnutrition of moderate degree   Palliative care by specialist   DNR (do not resuscitate) discussion   Lethargy   DNR (do not resuscitate)   Weakness generalized   Vaginal bleeding  # Acute Colitis and Chronic Duodenitis/Gastritis EGD biopsies suggestive of chronic peptic  duodenitis, chronic gastritis with reactive esophagitis, and likely acute, infectious, self-limited colitis. Patient's intermittent abdominal pain remains constant since admission, but nausea has improved with scopolamine patch, vomiting has resolved, and non-bloody diarrhea is improving.  - Continue PPI PO BID for 4 weeks then drop to 1x daily per GI - GI adding bentyl to see if improvement - Continue B12 1000 mcg x 7 days - Continue scopolamine patch and Emetrol PRN - Hold Imodium given GI recs and improving diarrhea - Advancing diet as tolerated with encouraged PO intake   # Severe AKI on likely Chronic Renal Failure stage IIIb with Resolved Metabolic Acidosis  Creatinine on arrival was 10.23 (up from 1.7 in 2019), BUN 73. Improving slowly to 5.81 and 49, respectively. GFR 4 --> 9, previous 36 in 2019. Likely prerenal due to prolonged poor PO intake and diarrhea. Renal Ultrasound showed increased echogenicity consistent with medical renal disease. Bicarb decreased to 21 today. Currently euvolemic. - Nephrology signed off given stability/improvement; however, patient is a poor dialysis candidate. PT and OT recommend SNF placement - Ordered TOC SNF placement consult; dispo will need to be discussed with family  - Net positive I&O today; discontinued LR this afternoon - Will resume sodium bicarb 650mg  three times daily if bicarb < 20 - Strict I&O - Continue to monitor daily renal function panel   # Electrolyte Abnormalities with Moderate Malnutrition Patient has only been consuming broth for at least 2 weeks at home with N/V/D. Nutrition endorses 22% weight loss over past 3 months.  Potassium on arrival was less than 2, corrected calcium was  6.8, magnesium was 1.4 likely secondary to poor p.o. intake, nausea vomiting and diarrhea. Prolonged QTc. IJ tunnel cath placed 0/81/44 without complication. - Potassium - 3.5   - Corrected calcium - 8.5, gave 2g calcium gluconate; will restart TUMS if  remains low tomorrow - Magnesium - 1.7, gave Mg 1g - Continue to encourage PO intake with advancing diet as tolerated - Patient refused Boost Breeze but doesn't believe she tried Prosource. Will continue to try different protein drinks if patient tolerates them - Will start on Ensure Enlive once diet is advanced if tolerated - Continue cardiac monitoring, monitoring QTc and PVC's  - Continue to monitor RFP   # Stable Normocytic Anemia Initial Hgb 8.3; however, due to vaginal bleeding, patient required single RBC transfusion 08/21/20 and again this morning due to Hgb 6.7 with fatigue/light-headedness. Post-transfusion Hgb 8.1. Iron panel WNL except for high TIBC, although ferritin 86 may be falsely elevated. Transabdominal US 08/23/20 showed thickened edometrium, 51mm, no fibroids; ovaries unable to be visualized. Patient has had poor OB-GYN follow up in past, concerning for possible endometrial carcinoma.  - OB-GYN consulted; appreciate their recommendations - Continue megestrol 40mg  PO twice daily  - Schedule follow up OP for endometrial Bx at [place] [Dr. Emeterio Reeve will message the office] if kidney function continues to improve - Will hold off on iron for now given GI inflammation though will likely require this vs. EPO in future - On SCDs for DVT PPx until bleeding resolves  # Hypertension  Blood pressures low overnight but now stable systolic on increased dose of amlodipine.   - Continue to monitor on amlodipine 10mg  daily  Prior to Admission Living Arrangement: Home  Anticipated Discharge Location: SNF vs. Home  Barriers to Discharge: Improving renal function, decreased PO intake, Active Colitis  Code Status: DNR/DNI Diet: advanced as tolerated; renal without fluid restriction IVF: Discontinued DVT PPx: SCD's given vaginal bleeding  Jeralyn Bennett, MD 08/26/2020, 4:52 PM Pager: [phone] After 5pm on weekdays and 1pm on weekends: On Call pager [phone]

## 2020-08-26 NOTE — Progress Notes (Signed)
Daily Rounding Note  08/26/2020, 12:24 PM  LOS: 10 days   SUBJECTIVE:   Chief complaint:   abd pain  Pain persists in mid abdomen.  Not severe.  Small to moderate size loose, non-bloody stool this AM. Tolerating liquids, these do not accelerate the pain. No n/v  OBJECTIVE:         Vital signs in last 24 hours:    Temp:  [97.7 F (36.5 C)-98.9 F (37.2 C)] 98.6 F (37 C) (09/14 0950) Pulse Rate:  [98-104] 104 (09/14 0950) Resp:  [16-18] 16 (09/14 0950) BP: (99-144)/(68-83) 144/75 (09/14 0950) SpO2:  [94 %-100 %] 100 % (09/14 0950) Last BM Date: 08/26/20 Filed Weights   08/17/2020 2059 08/18/20 0500 08/24/20 0454  Weight: 133 kg (!) 137.9 kg (!) 159.7 kg   General: obese, chron ill looking.  Some aphasia   Heart: RRR Chest: no labored breathing of cough Abdomen: soft, NT, ND.  Active BS.  soft  Neuro/Psych:  Oriented, appropriate.  No tremor.  R hemi paresis/weakness  Intake/Output from previous day: 09/13 0701 - 09/14 0700 In: 2506.1 [P.O.:920; I.V.:1194.7; IV Piggyback:391.3] Out: 2000 [Urine:2000]  Intake/Output this shift: Total I/O In: 801.7 [P.O.:360; I.V.:61.7; Blood:380] Out: 795 [Urine:795]  Lab Results: Recent Labs    08/25/20 0416 08/25/20 1230 08/26/20 0356  WBC 11.3* 12.0* 10.4  HGB 7.1* 7.6* 6.7*  HCT 21.6* 22.6* 20.3*  PLT 161 176 167   BMET Recent Labs    08/24/20 0718 08/25/20 0416 08/26/20 0356  NA 138 138 135  K 3.6 3.2* 3.5  CL 103 102 103  CO2 20* 23 21*  GLUCOSE 90 95 93  BUN 56* 54* 49*  CREATININE 6.79* 6.31* 5.81*  CALCIUM 6.2* 6.5* 6.6*   LFT Recent Labs    08/24/20 0718 08/25/20 0416 08/26/20 0356  ALBUMIN 1.8* 1.6* 1.6*   PT/INR No results for input(s): LABPROT, INR in the last 72 hours. Hepatitis Panel No results for input(s): HEPBSAG, HCVAB, HEPAIGM, HEPBIGM in the last 72 hours.  Studies/Results: No results found.   Scheduled Meds: . (feeding  supplement) PROSource Plus  30 mL Oral BID BM  . amLODipine  10 mg Oral Daily  . Chlorhexidine Gluconate Cloth  6 each Topical Daily  . cyanocobalamin  1,000 mcg Intramuscular Daily  . dicyclomine  10 mg Oral TID AC & HS  . megestrol  40 mg Oral BID  . pantoprazole  40 mg Oral BID  . scopolamine  1 patch Transdermal Q72H  . sodium chloride flush  10-40 mL Intracatheter Q12H  . sodium chloride flush  3 mL Intravenous Q12H   Continuous Infusions: . sodium chloride    . calcium gluconate 2,000 mg (08/26/20 1151)  . lactated ringers 50 mL/hr at 08/26/20 0415   PRN Meds:.sodium chloride, acetaminophen, anti-nausea, lidocaine-EPINEPHrine, sodium chloride flush   ASSESMENT:   *    N/V/D, abdominal pain. 09/10/2020 CTAP with diffuse colonic wall thickening, pericolonic edema involving descending and sigmoid colon, suspicious for colitis.  09/10/2020 EGD: Distal esophagus tortuous, path: reactive changes.  4 cm HH.  Gastric body and antral erythema. Nonbleeding, clean-based duodenal ulcers.  Biopsies of stomach and duodenum:  Peptic duodenitis, chronic gastritis,  Protonix 40 mg po bid in place.    09/09/2020 flexible sigmoidoscopy.  Stool throughout examined colon despite tapwater enema preop.  After lavage the mucosa appeared normal, biopsied obtained.  No gross evidence for IBD. Rectosigmoid, sigmoid diverticulosis.  Nonbleeding, nonthrombosed  external and internal hemorrhoids.  Suspect the colitis seen on initial CT may have resulted from ischemia. Path: edema, reactive changes and increased neutrophils within lamina propria. Findings s/o of acute self-limited (infectious)  colitis. Negative for features of chronicity, granulomas or features of microscopic colitis.   Stool c diff and PCR path panel negative.     *   Nonsevere, moderate malnutrition in the setting of chronic illness.  RD has added Prosource plus and recommends initiating Ensure tid.    *    Vaginal bleeding in postmenopausal  pt. thickened endometrium on imaging.  Megace initiated and likely to undergo outpatient endometrial biopsy per Dr. Emeterio Reeve.  *    Normocytic anemia.  Hgb 7.6 >> 6.7. 3rd PRBC given this AM.  No overt bleeding Ferritin 86, iron 64.  Normal iron saturation.  TIBC low at 214. B12 low at 92.   IM cyanocobalamin added.  *   AKI.  Improved.  Not a candidate for HD.  Renal has signed off.    PLAN   *   Per Dr Tarri Glenn.   No plans for further endoscopy.  Will sign off.    Adding time limited bentyl to see if it helps.  Keep Protonix 40 mg po bid for 4 weeks, then drop to 1 x daily.      Cristina Jordan  08/26/2020, 12:24 PM Phone [phone]

## 2020-08-26 NOTE — Progress Notes (Signed)
CRITICAL VALUE ALERT  Critical Value:  Hemoglobin= 6.7  Date & Time Notied:  08/26/20  Provider Notified: Tamala Julian 08/26/20 @ 05:38  Orders Received/Actions taken: Order received in epic. Patient to receive 1 unit of PRBC.

## 2020-08-27 ENCOUNTER — Telehealth: Payer: Self-pay

## 2020-08-27 LAB — TYPE AND SCREEN
ABO/RH(D): O POS
Antibody Screen: NEGATIVE
Unit division: 0
Unit division: 0

## 2020-08-27 LAB — RENAL FUNCTION PANEL
Albumin: 1.8 g/dL — ABNORMAL LOW (ref 3.5–5.0)
Anion gap: 12 (ref 5–15)
BUN: 49 mg/dL — ABNORMAL HIGH (ref 6–20)
CO2: 21 mmol/L — ABNORMAL LOW (ref 22–32)
Calcium: 6.8 mg/dL — ABNORMAL LOW (ref 8.9–10.3)
Chloride: 103 mmol/L (ref 98–111)
Creatinine, Ser: 5.57 mg/dL — ABNORMAL HIGH (ref 0.44–1.00)
GFR calc Af Amer: 9 mL/min — ABNORMAL LOW (ref >60)
GFR calc non Af Amer: 8 mL/min — ABNORMAL LOW (ref >60)
Glucose, Bld: 91 mg/dL (ref 70–99)
Phosphorus: 3.8 mg/dL (ref 2.5–4.6)
Potassium: 3.4 mmol/L — ABNORMAL LOW (ref 3.5–5.1)
Sodium: 136 mmol/L (ref 135–145)

## 2020-08-27 LAB — GLUCOSE, CAPILLARY
Glucose-Capillary: 91 mg/dL (ref 70–99)
Glucose-Capillary: 92 mg/dL (ref 70–99)
Glucose-Capillary: 96 mg/dL (ref 70–99)

## 2020-08-27 LAB — BPAM RBC
Blood Product Expiration Date: 202110062359
Blood Product Expiration Date: 202110132359
ISSUE DATE / TIME: 202109121210
ISSUE DATE / TIME: 202109140634
Unit Type and Rh: 5100
Unit Type and Rh: 5100

## 2020-08-27 LAB — CBC
HCT: 24 % — ABNORMAL LOW (ref 36.0–46.0)
Hemoglobin: 8 g/dL — ABNORMAL LOW (ref 12.0–15.0)
MCH: 29.9 pg (ref 26.0–34.0)
MCHC: 33.3 g/dL (ref 30.0–36.0)
MCV: 89.6 fL (ref 80.0–100.0)
Platelets: 175 10*3/uL (ref 150–400)
RBC: 2.68 MIL/uL — ABNORMAL LOW (ref 3.87–5.11)
RDW: 18.6 % — ABNORMAL HIGH (ref 11.5–15.5)
WBC: 11.4 10*3/uL — ABNORMAL HIGH (ref 4.0–10.5)
nRBC: 0 % (ref 0.0–0.2)

## 2020-08-27 LAB — MAGNESIUM: Magnesium: 1.7 mg/dL (ref 1.7–2.4)

## 2020-08-27 MED ORDER — LACTATED RINGERS IV SOLN: INTRAVENOUS | Status: DC

## 2020-08-27 MED ORDER — POTASSIUM CHLORIDE 10 MEQ/100ML IV SOLN
10.0000 meq | INTRAVENOUS | Status: AC
  Administered 2020-08-27 (×2): 10 meq via INTRAVENOUS
  Filled 2020-08-27 (×2): qty 100

## 2020-08-27 MED ORDER — MAGNESIUM SULFATE IN D5W 1-5 GM/100ML-% IV SOLN
1.0000 g | Freq: Once | INTRAVENOUS | Status: AC
  Administered 2020-08-27: 1 g via INTRAVENOUS
  Filled 2020-08-27: qty 100

## 2020-08-27 MED ORDER — FLUTICASONE PROPIONATE 50 MCG/ACT NA SUSP
1.0000 | Freq: Every day | NASAL | Status: DC | PRN
  Administered 2020-08-28: 1 via NASAL
  Filled 2020-08-27: qty 16

## 2020-08-27 MED ORDER — CALCIUM GLUCONATE-NACL 1-0.675 GM/50ML-% IV SOLN
1.0000 g | Freq: Once | INTRAVENOUS | Status: AC
  Administered 2020-08-27: 1000 mg via INTRAVENOUS
  Filled 2020-08-27: qty 50

## 2020-08-27 MED ORDER — ENSURE ENLIVE PO LIQD: 237.0000 mL | Freq: Three times a day (TID) | ORAL | Status: DC

## 2020-08-27 NOTE — Plan of Care (Signed)
  Problem: Nutrition: Goal: Adequate nutrition will be maintained Outcome: Not Progressing   

## 2020-08-27 NOTE — Progress Notes (Signed)
Subjective:   Patient examined at bedside. She declines PO supplements yesterday and states that they taste poor. States that her mouth feels dry and she is thirsty. However, she continues not to feel nauseas without vomiting and tolerating liquid (but not food) intake well. She states that she worked with PT yesterday not today, and has a desire to get up and out of bed. She denies depressed mood. She states her abdominal pain, diarrhea, and bleeding continue, unchanged. Denies swelling, CP or any other symptoms.  Objective:  Vital signs in last 24 hours: Vitals:   08/27/20 0010 08/27/20 0525 08/27/20 0955 08/27/20 1655  BP:  122/70 127/63 (!) 141/83  Pulse:  100 (!) 104 (!) 104  Resp:  17 17 18   Temp:  98.7 F (37.1 C) 98.3 F (36.8 C) 98.1 F (36.7 C)  TempSrc:  Oral Oral Oral  SpO2:  96% 100% 96%  Weight: (!) 159.7 kg (!) 159.7 kg    Height:       General: Patient is morbidly obese. Resting comfortably in NAD. HENT: Mucus membranes are moist. Respiratory: Anterior lung sounds with minimal bilateral wheezing but otherwise CTA. Cardiovascular: Rate is tachycardic. rhythm is regular. No murmurs, rubs, or gallops. There is very minimal lower extremity pitting edema. Neurological: Patient has R-sided hemiparesis.  GU: vaginal bleeding unable to be visualized. Abdominal: Soft without TTP, guarding, or rebound. No distention. Bowel sounds intact. Genitourinary: Foley is in place, draining urine.  Assessment/Plan:  Active Problems:   Rhabdomyolysis   Hypokalemia   Hypocalcemia   Acute renal failure (HCC)   Malnutrition of moderate degree   Palliative care by specialist   DNR (do not resuscitate) discussion   Lethargy   DNR (do not resuscitate)   Weakness generalized   Vaginal bleeding   Colitis   Chronic gastritis without bleeding  # Acute Colitis and Chronic Duodenitis/Gastritis EGD biopsies suggestive of chronic peptic duodenitis, chronic gastritis with reactive  esophagitis, and likely acute, infectious, self-limited colitis. Patient's intermittent abdominal pain remains constant since admission, but nausea has improved with scopolamine patch (can tolerate fluids, not food today), vomiting has resolved. Nursing report increase in non-bloody diarrhea.  - Continue PPI PO BID for 4 weeks then drop to 1x daily for 8 weeks followed by EGD in 3 months to document healing - Avoid NSAIDs - Will continue on Bentyl for now, although may discontinue if diarrhea continues or worsens - Continue B12 1000 mcg x 7 days - Continue scopolamine patch and Emetrol PRN - Hold Imodium given GI recs - Advancing diet as tolerated with encouraged PO intake  - Nutrition saw patient who will start Ensure Enlive PO three times daily   # Severe AKI on likely Chronic Renal Failure stage IIIb with Intermittent, Mild Metabolic Acidosis  Creatinine on arrival was 10.23 (up from 1.7 in 2019), BUN 73. Continue to slowly improve. GFR 4 --> 9, previous 36 in 2019. Likely prerenal due to prolonged poor PO intake and diarrhea. Renal Ultrasound showed increased echogenicity consistent with medical renal disease. Patient is slightly volume down with intermittent, mild metabolic acidosis. - Nephrology signed off given stability/improvement; however, patient is a poor dialysis candidate. PT and OT recommend SNF placement - Ordered TOC SNF placement consult; dispo will need to be discussed with family  - I&O net negative after stopping LR. Will restart at 13mL/hr  - Will resume sodium bicarb 650mg  three times daily if bicarb < 20 - Strict I&O - Continue to monitor daily renal  function panel   # Electrolyte Abnormalities with Moderate Malnutrition Patient has only been consuming broth for at least 2 weeks at home with N/V/D. Nutrition endorses 22% weight loss over past 3 months.  Potassium on arrival was less than 2, corrected calcium was 6.8, magnesium was 1.4 likely secondary to poor p.o. intake,  nausea vomiting and diarrhea. Prolonged QTc. IJ tunnel cath placed 3/38/32 without complication. - Patient required IV potassium, calcium, and magnesium replacements today - Continue to encourage PO intake with advancing diet as tolerated - Patient refused Boost Breeze and Prosource. Nutrition starting on Ensure Enlive three times daily. - Continue cardiac monitoring, monitoring QTc and PVC's  - Continue to monitor RFP and replace electrolytes as needed  # Stable Normocytic Anemia Initial Hgb 8.3; however, due to vaginal bleeding, patient required single RBC transfusion 08/21/20 and again this morning due to Hgb 6.7 with fatigue/light-headedness. Post-transfusion Hgb 8.1. Iron panel WNL except for high TIBC, although ferritin 86 may be falsely elevated. Transabdominal US 08/23/20 showed thickened edometrium, 62mm, no fibroids; ovaries unable to be visualized. Patient has had poor OB-GYN follow up in past, concerning for possible endometrial carcinoma.  - OB-GYN consulted; appreciate their recommendations - Continue megestrol 40mg  PO twice daily  - Schedule follow up OP for endometrial Bx at [place] [Dr. Emeterio Reeve will message the office] if kidney function continues to improve - Will hold off on iron for now given GI inflammation though will likely require this vs. EPO in future - On SCDs for DVT PPx until bleeding resolves  # Hypertension  Blood pressures stable on current regimen.   - Continue to monitor on amlodipine 10mg  daily  Prior to Admission Living Arrangement: Home  Anticipated Discharge Location: SNF  Barriers to Discharge: Decreased PO intake, electrolyte instability, SNF pending, active colitis/vaginal bleeding  Code Status: DNR/DNI Diet: advanced as tolerated; renal without fluid restriction IVF: LR 55mL/hr DVT PPx: SCD's given vaginal bleeding  Jeralyn Bennett, MD 08/27/2020, 6:03 PM Pager: [phone] After 5pm on weekdays and 1pm on weekends: On Call pager  [phone]

## 2020-08-27 NOTE — Telephone Encounter (Signed)
10/17/20 at 850 am appt with Dr Rush Landmark.  The pt is currently admitted and will be notified at discharge of the app.

## 2020-08-27 NOTE — Progress Notes (Signed)
Brief Nutrition Note  Noted that pt was transitioned to full liquid diet. Per RD note on 9/13, will now order Ensure Enlive po TID (each supplement provides 350 kcal and 20 grams of protein) to help pt meet protein/kcal goals.   Larkin Ina, MS, RD, LDN RD pager number and weekend/on-call pager number located in [place].

## 2020-08-27 NOTE — Telephone Encounter (Signed)
-----   Message from [place], MD sent at 08/26/2020  8:58 PM EDT ----- Regarding: Hospital follow-up Please arrange follow-up with Dr. Rush Landmark in 4-6 weeks.  KLB

## 2020-08-28 LAB — CBC
HCT: 24.7 % — ABNORMAL LOW (ref 36.0–46.0)
Hemoglobin: 8.1 g/dL — ABNORMAL LOW (ref 12.0–15.0)
MCH: 29.5 pg (ref 26.0–34.0)
MCHC: 32.8 g/dL (ref 30.0–36.0)
MCV: 89.8 fL (ref 80.0–100.0)
Platelets: 181 10*3/uL (ref 150–400)
RBC: 2.75 MIL/uL — ABNORMAL LOW (ref 3.87–5.11)
RDW: 18.3 % — ABNORMAL HIGH (ref 11.5–15.5)
WBC: 14.2 10*3/uL — ABNORMAL HIGH (ref 4.0–10.5)
nRBC: 0 % (ref 0.0–0.2)

## 2020-08-28 LAB — MAGNESIUM: Magnesium: 1.8 mg/dL (ref 1.7–2.4)

## 2020-08-28 LAB — GLUCOSE, CAPILLARY
Glucose-Capillary: 83 mg/dL (ref 70–99)
Glucose-Capillary: 92 mg/dL (ref 70–99)
Glucose-Capillary: 94 mg/dL (ref 70–99)

## 2020-08-28 LAB — RENAL FUNCTION PANEL
Albumin: 1.8 g/dL — ABNORMAL LOW (ref 3.5–5.0)
Anion gap: 13 (ref 5–15)
BUN: 45 mg/dL — ABNORMAL HIGH (ref 6–20)
CO2: 18 mmol/L — ABNORMAL LOW (ref 22–32)
Calcium: 7 mg/dL — ABNORMAL LOW (ref 8.9–10.3)
Chloride: 105 mmol/L (ref 98–111)
Creatinine, Ser: 4.89 mg/dL — ABNORMAL HIGH (ref 0.44–1.00)
GFR calc Af Amer: 11 mL/min — ABNORMAL LOW (ref >60)
GFR calc non Af Amer: 9 mL/min — ABNORMAL LOW (ref >60)
Glucose, Bld: 92 mg/dL (ref 70–99)
Phosphorus: 3.7 mg/dL (ref 2.5–4.6)
Potassium: 3.9 mmol/L (ref 3.5–5.1)
Sodium: 136 mmol/L (ref 135–145)

## 2020-08-28 MED ORDER — SODIUM BICARBONATE 650 MG PO TABS
650.0000 mg | ORAL_TABLET | Freq: Two times a day (BID) | ORAL | Status: DC
  Administered 2020-08-28 (×2): 650 mg via ORAL
  Filled 2020-08-28 (×2): qty 1

## 2020-08-28 NOTE — Plan of Care (Signed)
  Problem: Activity: Goal: Risk for activity intolerance will decrease Outcome: Progressing   Problem: Nutrition: Goal: Adequate nutrition will be maintained Outcome: Progressing   

## 2020-08-28 NOTE — Progress Notes (Signed)
Physical Therapy Treatment Patient Details Name: Cristina Jordan MRN: [id number] DOB: Nov 26, 1961 Today's Date: 08/28/2020    History of Present Illness 59 year old woman with h/o CVA, WC bound, presenting with worsening weakness.  She also has nausea, vomiting, decreased PO intake, abdominal pain, diarrhea X 1 week and weak to the point of not being able to get out of bed.  She was found to have colitis, Severe AKI, and electrolyte abnormalities.      PT Comments    The pt was in bed upon arrival of PT/OT, and agreeable to session with focus on progression of transfers and mobility. The pt was able to initiate some movements with her LUE to complete rolling for pericare and cleaning in bed, but still requires significant assist of 2-3 to complete rolling and cleaning in bed. The pt was able to progress to long-sitting with use of elevated HOB and maxA of PT and OT using a sheet to lift her trunk from the [facility] to achieve long-sitting position. The pt remains adamant about working on OOB transfers to recliner or [facility], so this will be an ongoing goal with therapy sessions. At this point, the pt will continue to benefit from skilled PT prior to return home to facilitate improved safety and independence for transfers.    Follow Up Recommendations  SNF;Supervision/Assistance - 24 hour     Equipment Recommendations   (defer to post acute (if pt were going home she would need hospital bed, lift, and bariatric BSC))    Recommendations for Other Services       Precautions / Restrictions Precautions Precautions: Fall;Other (comment) Precaution Comments: baseline R-sided hemiplegia Restrictions Other Position/Activity Restrictions: flaccid RUE/RLE    Mobility  Bed Mobility Overal bed mobility: Needs Assistance Bed Mobility: Rolling Rolling: Total assist;+2 for physical assistance         General bed mobility comments: pt able to initiate some reaching with LUE, but not enough to facilitate  rolling in bed. still requires totalA to complete movements. Attempted long-sit with use of sheet to lift shoulders off elevated HOB. put unable to maintain with out maxA of PT and OT  Transfers                 General transfer comment: unsafe at this time    Balance                                            Cognition Arousal/Alertness: Awake/alert Behavior During Therapy: Flat affect Overall Cognitive Status: No family/caregiver present to determine baseline cognitive functioning                                 General Comments: likely poor health literacy at baseline, pt able to follow commands in session and answer questions appropriately, but poor understanding of safety as the pt continued to ask about completing OOB transfers despite requiring assist of 3 to complete rolling in bed.      Exercises General Exercises - Lower Extremity Quad Sets: AROM;Left;10 reps;Supine Heel Slides: AROM;Left;10 reps;Supine    General Comments General comments (skin integrity, edema, etc.): unable to progress mobility to truly assess balance, requires maxA to maintain rolled position or long-sitting in bed      Pertinent Vitals/Pain Pain Assessment: Faces Faces Pain Scale: Hurts little more Pain  Location: grimacing and moaning during rolling and pericare in sidelying Pain Descriptors / Indicators: Grimacing;Moaning Pain Intervention(s): Limited activity within patient's tolerance;Monitored during session           PT Goals (current goals can now be found in the care plan section) Acute Rehab PT Goals Patient Stated Goal: agreeable to SNF PT Goal Formulation: With patient Time For Goal Achievement: 09/08/20 Potential to Achieve Goals: Good Progress towards PT goals: Progressing toward goals    Frequency    Min 2X/week      PT Plan      Co-evaluation PT/OT/SLP Co-Evaluation/Treatment: Yes Reason for Co-Treatment: For  patient/therapist safety;To address functional/ADL transfers PT goals addressed during session: Mobility/safety with mobility;Strengthening/ROM        AM-PAC PT "6 Clicks" Mobility   Outcome Measure  Help needed turning from your back to your side while in a flat bed without using bedrails?: A Lot Help needed moving from lying on your back to sitting on the side of a flat bed without using bedrails?: Total Help needed moving to and from a bed to a chair (including a wheelchair)?: Total Help needed standing up from a chair using your arms (e.g., wheelchair or bedside chair)?: Total Help needed to walk in hospital room?: Total Help needed climbing 3-5 steps with a railing? : Total 6 Click Score: 7    End of Session   Activity Tolerance: Patient tolerated treatment well Patient left: in bed;with call bell/phone within reach Nurse Communication: Mobility status PT Visit Diagnosis: Difficulty in walking, not elsewhere classified (R26.2);Muscle weakness (generalized) (M62.81)     Time: 1010-1049 PT Time Calculation (min) (ACUTE ONLY): 39 min  Charges:  $Therapeutic Exercise: 8-22 mins $Therapeutic Activity: 8-22 mins                     Karma Ganja, PT, DPT   Acute Rehabilitation Department Pager #: [phone]   Otho Bellows 08/28/2020, 1:06 PM

## 2020-08-28 NOTE — Progress Notes (Signed)
Orthopedic Tech Progress Note Patient Details:  Cristina Jordan Oct 24, 1961 [id number] Called in order to [facility]. Patient ID: Harrison Paulson, female   DOB: 1961-09-18, 59 y.o.   MRN: [id number]   Chip Boer 08/28/2020, 1:34 PM

## 2020-08-28 NOTE — Progress Notes (Signed)
Occupational Therapy Treatment Patient Details Name: Cristina Jordan MRN: [id number] DOB: 1960-12-17 Today's Date: 08/28/2020    History of present illness 59 year old woman with h/o CVA, WC bound, presenting with worsening weakness.  She also has nausea, vomiting, decreased PO intake, abdominal pain, diarrhea X 1 week and weak to the point of not being able to get out of bed.  She was found to have colitis, Severe AKI, and electrolyte abnormalities.     OT comments  Patient supine in bed and agreeable to OT/PT session. Patient requires +2-3 to roll in bed and assist to peri hygiene after incontinent BM.  Patient able to initiate some to assist rolling towards R side, but not towards L side. Progressed towards long sitting in bed but requires max assist +2 with HOB elevated using sheet for trunk elevation, and unable to sustain without max assist.  Pt eager to get OOB and discussed safety/recommendations, will need bari recliner and lift at this time.  Discussed resting hand splint for R hand due to flexion synergy tone noted and pt reporting "having no idea" what happened to her splint. Will follow acutely.    Follow Up Recommendations  SNF    Equipment Recommendations  Other (comment) (defer to next venue )    Recommendations for Other Services      Precautions / Restrictions Precautions Precautions: Fall;Other (comment) Precaution Comments: baseline R-sided hemiplegia Restrictions Weight Bearing Restrictions: No Other Position/Activity Restrictions: flaccid RUE/RLE       Mobility Bed Mobility Overal bed mobility: Needs Assistance Bed Mobility: Rolling Rolling: Total assist;+2 for physical assistance         General bed mobility comments: pt able to initiate some reaching with LUE, but not enough to facilitate rolling in bed. still requires totalA to complete movements. Attempted long-sit with use of sheet to lift shoulders off elevated HOB. put unable to maintain with out  maxA of PT and OT  Transfers                 General transfer comment: unsafe at this time- will need maximove to transfer     Balance                                           ADL either performed or assessed with clinical judgement   ADL Overall ADL's : Needs assistance/impaired                             Toileting- Clothing Manipulation and Hygiene: Total assistance;+2 for physical assistance;+2 for safety/equipment;Bed level Toileting - Clothing Manipulation Details (indicate cue type and reason): +3 for pericare        General ADL Comments: limited by R sided hemiparesis, body habitus and decreased tolerance     Vision       Perception     Praxis      Cognition Arousal/Alertness: Awake/alert Behavior During Therapy: Flat affect Overall Cognitive Status: No family/caregiver present to determine baseline cognitive functioning                                 General Comments: patients daughter present initally but steps out after the first few minutes, patient able to follow simple commands and engage appropraitely but demonstrating poor awareness to  deficits and safety (asking to get OOB despite requiring assist of 3 to roll in bed)         Exercises General Exercises - Lower Extremity Quad Sets: AROM;Left;10 reps;Supine Heel Slides: AROM;Left;10 reps;Supine   Shoulder Instructions       General Comments ordered resting hand splint for R UE, PROM of R UE from shoulder to hand with noted flexion synergy tone patterns with subluxation at shoulder -- UE repositioned on pillow for increased support     Pertinent Vitals/ Pain       Pain Assessment: Faces Faces Pain Scale: Hurts little more Pain Location: grimacing and moaning during rolling and pericare in sidelying Pain Descriptors / Indicators: Grimacing;Moaning Pain Intervention(s): Limited activity within patient's tolerance;Monitored during  session;Repositioned  Home Living                                          Prior Functioning/Environment              Frequency  Min 2X/week        Progress Toward Goals  OT Goals(current goals can now be found in the care plan section)  Progress towards OT goals: Progressing toward goals  Acute Rehab OT Goals Patient Stated Goal: agreeable to SNF OT Goal Formulation: With patient  Plan Discharge plan remains appropriate;Frequency remains appropriate    Co-evaluation    PT/OT/SLP Co-Evaluation/Treatment: Yes Reason for Co-Treatment: For patient/therapist safety;To address functional/ADL transfers PT goals addressed during session: Mobility/safety with mobility;Strengthening/ROM OT goals addressed during session: ADL's and self-care      AM-PAC OT "6 Clicks" Daily Activity     Outcome Measure   Help from another person eating meals?: A Little Help from another person taking care of personal grooming?: A Lot Help from another person toileting, which includes using toliet, bedpan, or urinal?: Total Help from another person bathing (including washing, rinsing, drying)?: Total Help from another person to put on and taking off regular upper body clothing?: Total Help from another person to put on and taking off regular lower body clothing?: Total 6 Click Score: 9    End of Session    OT Visit Diagnosis: Unsteadiness on feet (R26.81);Other abnormalities of gait and mobility (R26.89);Muscle weakness (generalized) (M62.81);Hemiplegia and hemiparesis;Pain Hemiplegia - Right/Left: Right   Activity Tolerance Patient tolerated treatment well   Patient Left in bed;with call bell/phone within reach;with SCD's reapplied   Nurse Communication Mobility status        Time: 1010-1043 OT Time Calculation (min): 33 min  Charges: OT General Charges $OT Visit: 1 Visit OT Treatments $Self Care/Home Management : 8-22 mins  [name], [place] Pager [phone] Office [phone]    [name] 08/28/2020, 1:24 PM

## 2020-08-28 NOTE — Progress Notes (Signed)
Orthopedic Tech Progress Note Patient Details:  Joletta Manner November 13, 1961 [id number] Called in brace Patient ID: Rada Zegers, female   DOB: [birth year], 59 y.o.   MRN: [id number]   Ellouise Newer 08/28/2020, 1:33 PM

## 2020-08-28 NOTE — Progress Notes (Signed)
Subjective:   Patient complains of continued altered taste, stating that everything tastes like metal. Reports no problems with fluid intake, states she tried to eat some Catheryn Bacon and was able to tolerate them okay. She continues to decline supplement shakes (Ensure) as they make her vomit. Otherwise, no vomiting. She does report frustration due to continued diarrhea, but has not been taking Bentyl. Her abdominal pain has slightly improved and remains intermittent. She reports continued vaginal bleeding. She notes that she has had nasal congestion since yesterday but states this has improved with Flonase. She denies lightheadedness, dizziness, or SOB since her transfusion and says her weakness is improving and would like to get out of bed. No fevers, chills, swelling or oral sores.   Objective:  Vital signs in last 24 hours: Vitals:   08/27/20 1655 08/27/20 2100 08/28/20 0531 08/28/20 0946  BP: (!) 141/83 (!) 140/93 126/69 129/81  Pulse: (!) 104 (!) 106 (!) 102 100  Resp: 18 18  16   Temp: 98.1 F (36.7 C) 98.9 F (37.2 C) 97.8 F (36.6 C) 98 F (36.7 C)  TempSrc: Oral Oral Oral Oral  SpO2: 96% 100% 96% 98%  Weight:      Height:       General: Patient is morbidly obese. Resting comfortably in NAD. HENT: Mucus membranes are moist. Edentulous.  Respiratory: Anterior lung sounds CTA bilaterally. Cardiovascular: Rate is slightly tachycardic. rhythm is regular. No murmurs, rubs, or gallops. No noticeable LE pitting edema. Neurological: Patient has R-sided hemiparesis.  Abdominal: Soft without TTP, guarding, or rebound. No distention. Bowel sounds intact. Genitourinary: Foley is in place, draining urine. Psychiatric: Flat affect.  Assessment/Plan:  Active Problems:   Rhabdomyolysis   Hypokalemia   Hypocalcemia   Acute renal failure (HCC)   Malnutrition of moderate degree   Palliative care by specialist   DNR (do not resuscitate) discussion   Lethargy   DNR (do not  resuscitate)   Weakness generalized   Vaginal bleeding   Colitis   Chronic gastritis without bleeding  # Acute Colitis and Chronic Duodenitis/Gastritis EGD biopsies suggestive of chronic peptic duodenitis, chronic gastritis with reactive esophagitis, and likely acute, infectious, self-limited colitis. Patient's nausea and vomiting have significantly improved and abdominal pain has improved slightly since admission. Able to begin to tolerate solid foods today, but continuing to decline nutritional supplements PO. Diarrhea continues although she has not been taking Bentyl regularly. - Continue PPI PO BID for 4 weeks then drop to 1x daily for 8 weeks followed by EGD in 3 months to document healing, per GI. - Avoid NSAIDs - Will attempt to give Bentyl regularly as prescribed - Continue B12 1000 mcg x 7 days - Continue scopolamine patch and Emetrol PRN - Hold Imodium given GI recs - Advancing diet as tolerated with encouraged PO intake    # Severe AKI on likely Chronic Renal Failure stage IIIb with Intermittent, Mild Metabolic Acidosis  Creatinine on arrival was 10.23 (up from 1.7 in 2019), BUN 73. Continue to gradually improve. GFR 4 --> 1, previous 36 in 2019. Likely prerenal due to prolonged poor PO intake and diarrhea. Renal Ultrasound showed increased echogenicity consistent with medical renal disease. Patient appears euvolemic. She has mild NAGMA, bicarb is 18. - Nephrology signed off given stability/improvement; however, patient is a poor dialysis candidate. PT and OT recommend SNF placement. - Ordered TOC SNF placement consult; dispo will need to be discussed with family  - Continue LR 69mL/hr  - Will resume sodium bicarb  650mg  PO twice daily given acidosis - Continue to monitor daily renal function panel   # Electrolyte Abnormalities with Moderate Malnutrition Patient has only been consuming broth for at least 2 weeks at home with N/V/D. Nutrition endorses 22% weight loss over past 3  months.  Potassium on arrival was less than 2, corrected calcium was 6.8, magnesium was 1.4 likely secondary to poor p.o. intake, nausea vomiting and diarrhea. Prolonged QTc. IJ tunnel cath placed 0/32/12 without complication. - All electrolytes currently within normal range today - Albumin 1.8, although patient refusing supplemental nutrition; continue to encourage PO intake as tolerated - Continue cardiac monitoring, monitoring QTc and PVC's  - Continue to monitor RFP and replace electrolytes as needed  # Stable Normocytic Anemia Initial Hgb 8.3; however, due to vaginal bleeding, patient required single RBC transfusion 08/21/20 and again this morning due to Hgb 6.7 with fatigue/light-headedness. Post-transfusion Hgb 8.1. Iron panel WNL except for high TIBC, although ferritin 86 may be falsely elevated. Transabdominal US 08/23/20 showed thickened edometrium, 77mm, no fibroids; ovaries unable to be visualized. Patient has had poor OB-GYN follow up in past, concerning for possible endometrial carcinoma.  - OB-GYN consulted; patient will be scheduled to follow up OP for endometrial Bx at [place] [Dr. Emeterio Reeve will message the office] if kidney function continues to improve - Continue megestrol 40mg  PO twice daily  - Will hold off on iron for now given GI inflammation though will likely require this vs. EPO in future - On SCDs for DVT PPx until bleeding resolves  # Hypertension  Blood pressures stable on current regimen.   - Continue to monitor on amlodipine 10mg  daily  # Nasal Congestion  Patient has had nasal congestion x 1 day with altered taste. May be viral in nature. - Will continue Flonase and add PO medication if minimal improvement  Prior to Admission Living Arrangement: Home  Anticipated Discharge Location: SNF  Barriers to Discharge: Decreased PO intake, SNF pending  Code Status: DNR/DNI Diet: advanced as tolerated; renal without fluid restriction IVF: LR 25mL/hr DVT  PPx: SCD's given vaginal bleeding  Andrew Au, MD 08/28/2020, 10:58 AM Pager: [phone] After 5pm on weekdays and 1pm on weekends: On Call pager [phone]

## 2020-08-29 ENCOUNTER — Encounter: Payer: Self-pay | Admitting: Gastroenterology

## 2020-08-29 LAB — CBC
HCT: 24.7 % — ABNORMAL LOW (ref 36.0–46.0)
Hemoglobin: 7.9 g/dL — ABNORMAL LOW (ref 12.0–15.0)
MCH: 29 pg (ref 26.0–34.0)
MCHC: 32 g/dL (ref 30.0–36.0)
MCV: 90.8 fL (ref 80.0–100.0)
Platelets: 198 10*3/uL (ref 150–400)
RBC: 2.72 MIL/uL — ABNORMAL LOW (ref 3.87–5.11)
RDW: 18.3 % — ABNORMAL HIGH (ref 11.5–15.5)
WBC: 12.2 10*3/uL — ABNORMAL HIGH (ref 4.0–10.5)
nRBC: 0 % (ref 0.0–0.2)

## 2020-08-29 LAB — GLUCOSE, CAPILLARY
Glucose-Capillary: 84 mg/dL (ref 70–99)
Glucose-Capillary: 91 mg/dL (ref 70–99)
Glucose-Capillary: 91 mg/dL (ref 70–99)

## 2020-08-29 LAB — RENAL FUNCTION PANEL
Albumin: 1.8 g/dL — ABNORMAL LOW (ref 3.5–5.0)
Anion gap: 12 (ref 5–15)
BUN: 43 mg/dL — ABNORMAL HIGH (ref 6–20)
CO2: 18 mmol/L — ABNORMAL LOW (ref 22–32)
Calcium: 6.8 mg/dL — ABNORMAL LOW (ref 8.9–10.3)
Chloride: 106 mmol/L (ref 98–111)
Creatinine, Ser: 4.5 mg/dL — ABNORMAL HIGH (ref 0.44–1.00)
GFR calc Af Amer: 12 mL/min — ABNORMAL LOW (ref >60)
GFR calc non Af Amer: 10 mL/min — ABNORMAL LOW (ref >60)
Glucose, Bld: 95 mg/dL (ref 70–99)
Phosphorus: 3.4 mg/dL (ref 2.5–4.6)
Potassium: 3.7 mmol/L (ref 3.5–5.1)
Sodium: 136 mmol/L (ref 135–145)

## 2020-08-29 LAB — MAGNESIUM: Magnesium: 1.7 mg/dL (ref 1.7–2.4)

## 2020-08-29 MED ORDER — MAGNESIUM SULFATE IN D5W 1-5 GM/100ML-% IV SOLN
1.0000 g | Freq: Once | INTRAVENOUS | Status: AC
  Administered 2020-08-29: 1 g via INTRAVENOUS
  Filled 2020-08-29: qty 100

## 2020-08-29 MED ORDER — CALCIUM GLUCONATE-NACL 1-0.675 GM/50ML-% IV SOLN
1.0000 g | Freq: Once | INTRAVENOUS | Status: AC
  Administered 2020-08-29: 1000 mg via INTRAVENOUS
  Filled 2020-08-29: qty 50

## 2020-08-29 MED ORDER — SODIUM BICARBONATE 650 MG PO TABS
650.0000 mg | ORAL_TABLET | Freq: Three times a day (TID) | ORAL | Status: DC
  Filled 2020-08-29 (×2): qty 1

## 2020-08-29 MED ORDER — SODIUM BICARBONATE 650 MG PO TABS
650.0000 mg | ORAL_TABLET | Freq: Two times a day (BID) | ORAL | Status: DC
  Administered 2020-08-30 – 2020-08-31 (×3): 650 mg via ORAL
  Filled 2020-08-29 (×3): qty 1

## 2020-08-29 MED ORDER — CALCIUM CARBONATE ANTACID 500 MG PO CHEW
400.0000 mg | CHEWABLE_TABLET | Freq: Every day | ORAL | Status: DC
  Administered 2020-08-30 – 2020-08-31 (×2): 400 mg via ORAL
  Filled 2020-08-29 (×2): qty 2

## 2020-08-29 MED ORDER — DICYCLOMINE HCL 10 MG PO CAPS
10.0000 mg | ORAL_CAPSULE | Freq: Three times a day (TID) | ORAL | Status: DC
  Administered 2020-08-29 – 2020-08-31 (×6): 10 mg via ORAL
  Filled 2020-08-29 (×7): qty 1

## 2020-08-29 NOTE — Progress Notes (Signed)
Subjective:   Patient examined at bedside. Complains that a blood pressure pill feels like it is getting stuck in her throat. She denies any trouble swallowing. Had a hard time sleeping because of this. Also reports some nausea and vomiting, no blood in her emesis. She says her abdominal pain and diarrhea have slightly improved today. Reports continued vaginal bleeding. Denies CP, SOB, dizziness, lightheadedness, swelling. She requests a menu to be able to select her food as she hasn't been eating due to distaste for the food provided.   Objective:  Vital signs in last 24 hours: Vitals:   08/28/20 1637 08/28/20 2225 08/29/20 0541 08/29/20 1633  BP: (!) 147/88 (!) 148/97 (!) 148/92 124/76  Pulse: (!) 102 (!) 110 (!) 110 (!) 108  Resp: 18 19 16 18   Temp: 98.2 F (36.8 C) 98.8 F (37.1 C) 98.3 F (36.8 C) 98.2 F (36.8 C)  TempSrc: Oral     SpO2: 98% 100% 100% 100%  Weight:  (!) 149.7 kg    Height:       General: Patient is morbidly obese. Resting comfortably in NAD. HENT: Mucus membranes are moist. Edentulous.  Respiratory: Anterior lung sounds CTA bilaterally. Cardiovascular: Rate is tachycardic. rhythm is regular. No murmurs, rubs, or gallops. No noticeable LE pitting edema. Neurological: Patient has R-sided hemiparesis.  Abdominal: Soft without TTP, guarding, or rebound. No distention. Bowel sounds intact. Genitourinary: Foley is in place, draining urine. Psychiatric: Flat affect.  Assessment/Plan:  Active Problems:   Rhabdomyolysis   Hypokalemia   Hypocalcemia   Acute renal failure (HCC)   Malnutrition of moderate degree   Palliative care by specialist   DNR (do not resuscitate) discussion   Lethargy   DNR (do not resuscitate)   Weakness generalized   Vaginal bleeding   Colitis   Chronic gastritis without bleeding  # Acute Colitis and Chronic Duodenitis/Gastritis EGD biopsies suggestive of chronic peptic duodenitis, chronic gastritis with reactive esophagitis,  and likely acute, infectious, self-limited colitis. Patient's nausea and vomiting have continued, although abdominal pain and diarrhea improved slightly today despite not receiving Bentyl regularly. Patient feels pill is stuck in her throat. - Continue PPI PO BID for 4 weeks then drop to 1x daily for 8 weeks followed by EGD in 3 months to document healing, per GI. - Avoid NSAIDs - Holding oral medications until SLP evaluation - Encouraged nursing staff to provide patient menu to select her food - Continue to encourage PO intake  - Continue B12 1000 mcg x 7 days - Continue scopolamine patch and Emetrol PRN - Hold Imodium given GI recs   # Severe AKI on likely Chronic Renal Failure stage IIIb with Intermittent, Mild Metabolic Acidosis  Creatinine on arrival was 10.23 (up from 1.7 in 2019), BUN 73, GFR 4, all continuing to imrpove. Likely prerenal due to prolonged poor PO intake and diarrhea. Renal Ultrasound showed increased echogenicity consistent with medical renal disease. Patient appears euvolemic. She has mild NAGMA. - Nephrology signed off given stability/improvement; however, patient is a poor dialysis candidate. PT and OT recommend SNF placement. - Ordered TOC SNF placement consult; dispo will need to be discussed with family  - Continue LR 53mL/hr - Monitor Strict I&O - Will continue NaHCO3 650mg  twice daily  - Continue to monitor daily renal function panel   # Electrolyte Abnormalities with Moderate Malnutrition Patient has only been consuming broth for at least 2 weeks at home with N/V/D. Nutrition endorses 22% weight loss over past 3 months.  Potassium  on arrival was less than 2, corrected calcium was 6.8, magnesium was 1.4 likely secondary to poor p.o. intake, nausea vomiting and diarrhea. Prolonged QTc. IJ tunnel cath placed 5/32/02 without complication. - Gave 1g Mg sulfate and 1g calcium gluconate today - Will try to replace calcium orally tomorrow if patient tolerates this -  Albumin low, encouraged PO intake / ordering from menu - Continue to replace as needed  # Stable Normocytic Anemia Likely due to vaginal bleeding and worsening CKD. Hgb stable. - OB-GYN consulted; patient will be scheduled to follow up OP for endometrial Bx at [place] [Dr. Emeterio Reeve will message the office] if kidney function continues to improve - Continue megestrol 40mg  PO twice daily  - Will hold off on iron for now given GI inflammation though will likely require this vs. EPO in future - On SCDs for DVT PPx until bleeding resolves  # Hypertension  Blood pressures stable on current regimen.   - Continue to monitor on amlodipine 10mg  daily  # Nasal Congestion  Patient has had nasal congestion x 1 day with altered taste. May be viral in nature. - Will continue Flonase and add PO medication if minimal improvement  Prior to Admission Living Arrangement: Home  Anticipated Discharge Location: SNF  Barriers to Discharge: Decreased PO intake, SNF pending  Code Status: DNR/DNI Diet: advanced as tolerated; renal without fluid restriction IVF: LR 11mL/hr DVT PPx: SCD's given vaginal bleeding  Jeralyn Bennett, MD 08/29/2020, 5:02 PM Pager: [phone] After 5pm on weekdays and 1pm on weekends: On Call pager [phone]

## 2020-08-29 NOTE — Progress Notes (Signed)
Occupational Therapy Treatment Patient Details Name: Cristina Jordan MRN: [id number] DOB: September 19, 1961 Today's Date: 08/29/2020    History of present illness 59 year old woman with h/o CVA, WC bound, presenting with worsening weakness.  She also has nausea, vomiting, decreased PO intake, abdominal pain, diarrhea X 1 week and weak to the point of not being able to get out of bed.  She was found to have colitis, Severe AKI, and electrolyte abnormalities.     OT comments  Patient supine in bed and agreeable to splint check. Mild indentation noted across dorsal aspect of hand, but resolved within 5 minutes of doffing splint; pt denies pain or discomfort.  Noted significantly decreased tone after 2 hours of wearing splint and increased ROM of digits. Pt unable to don/doff without total assist- RN notified of splint mgmt and wear schedule, hung sign in room as well.   RN STAFF Schedule: on 4 hours/ off 4 hours   Please check splint every 4 hours during shift ( remove splint , remove stockinette/ dressing present) to assess for: * pain * redness *swelling  If any symptoms above present remove splint for 15 minutes. If symptoms continue - keep the splint removed and notify OT staff [phone] immediately.   Keep the UE elevated at all times on pillows / towels.      Follow Up Recommendations  SNF    Equipment Recommendations  Other (comment)    Recommendations for Other Services      Precautions / Restrictions Precautions Precautions: Fall;Other (comment) Precaution Comments: baseline R-sided hemiplegia Restrictions Weight Bearing Restrictions: No Other Position/Activity Restrictions: flaccid RUE/RLE       Mobility Bed Mobility                  Transfers                 General transfer comment: unsafe at this time- will need maximove to transfer     Balance                                           ADL either performed or assessed with  clinical judgement   ADL                                         General ADL Comments: splint check performed and made splint schedule     Vision       Perception     Praxis      Cognition Arousal/Alertness: Awake/alert Behavior During Therapy: Flat affect Overall Cognitive Status: No family/caregiver present to determine baseline cognitive functioning                                 General Comments: pt continues to perseverate on getting OOB, patient with poor understanding that she needs more than 1+ assist to complete transfer        Exercises     Shoulder Instructions       General Comments Therapist removed splint to R UE, mild indentation acoss dorsal aspect of hand (which resolves after approx 5 minutes) but pt denies pain. Noted decreased tone after 2 hours of wearing splint, hand relaxing in extension.  Discussed scheudle with RN,  management of splint; hung sign in room.      Pertinent Vitals/ Pain       Pain Assessment: Faces Faces Pain Scale: No hurt  Home Living                                          Prior Functioning/Environment              Frequency  Min 2X/week        Progress Toward Goals  OT Goals(current goals can now be found in the care plan section)  Progress towards OT goals: Progressing toward goals  Acute Rehab OT Goals Patient Stated Goal: agreeable to SNF OT Goal Formulation: With patient ADL Goals Additional ADL Goal #3: Pt will tolerate resting hand splint for 4 hours on/off, getting caregiver to assist with donning/doffing, in order to further prevent contractures to R hand.  Plan Discharge plan remains appropriate;Frequency remains appropriate    Co-evaluation                 AM-PAC OT "6 Clicks" Daily Activity     Outcome Measure   Help from another person eating meals?: A Little Help from another person taking care of personal grooming?: A Lot Help from  another person toileting, which includes using toliet, bedpan, or urinal?: Total Help from another person bathing (including washing, rinsing, drying)?: Total Help from another person to put on and taking off regular upper body clothing?: Total Help from another person to put on and taking off regular lower body clothing?: Total 6 Click Score: 9    End of Session    OT Visit Diagnosis: Unsteadiness on feet (R26.81);Other abnormalities of gait and mobility (R26.89);Muscle weakness (generalized) (M62.81);Hemiplegia and hemiparesis;Pain Hemiplegia - Right/Left: Right   Activity Tolerance Patient tolerated treatment well   Patient Left in bed;with call bell/phone within reach;with bed alarm set   Nurse Communication Mobility status;Other (comment) (resting hand splint )        Time: 4332-9518 OT Time Calculation (min): 17 min  Charges: OT General Charges $OT Visit: 1 Visit OT Treatments $Orthotics Fit/Training: 8-22 mins $Orthotics/Prosthetics Check: 8-22 mins  [name], OT Acute Rehabilitation Services Pager [phone] Office [phone]    [name] 08/29/2020, 3:12 PM

## 2020-08-29 NOTE — Progress Notes (Signed)
Occupational Therapy Treatment Patient Details Name: Cristina Jordan MRN: [id number] DOB: [birth year] Today's Date: 08/29/2020    History of present illness 59 year old woman with h/o CVA, WC bound, presenting with worsening weakness.  She also has nausea, vomiting, decreased PO intake, abdominal pain, diarrhea X 1 week and weak to the point of not being able to get out of bed.  She was found to have colitis, Severe AKI, and electrolyte abnormalities.     OT comments  Session focused on resting hand splint application/fit.  Patient with good fit, requires total assist to don, after PROM and stretch to UE.  Will follow with schedule after several hour fit check to ensure no redness or irritations.  RN aware of splint.    Follow Up Recommendations  SNF    Equipment Recommendations  Other (comment) (TBD)    Recommendations for Other Services      Precautions / Restrictions Precautions Precautions: Fall;Other (comment) Precaution Comments: baseline R-sided hemiplegia Restrictions Weight Bearing Restrictions: No Other Position/Activity Restrictions: flaccid RUE/RLE       Mobility Bed Mobility                  Transfers                 General transfer comment: unsafe at this time- will need maximove to transfer     Balance                                           ADL either performed or assessed with clinical judgement   ADL                                         General ADL Comments: session focused on R resting hand splint fit      Vision       Perception     Praxis      Cognition Arousal/Alertness: Awake/alert Behavior During Therapy: Flat affect Overall Cognitive Status: No family/caregiver present to determine baseline cognitive functioning                                 General Comments: pt continues to perseverate on getting OOB, patient with poor understanding that she needs more than 1+  assist to complete transfer        Exercises     Shoulder Instructions       General Comments PROM of R UE from shoulder to hand, gentle stretch.  Resting hand splint applied to UE with good fit.  RN notified of plan for trial and schedule.      Pertinent Vitals/ Pain       Pain Assessment: Faces Faces Pain Scale: No hurt  Home Living                                          Prior Functioning/Environment              Frequency  Min 2X/week        Progress Toward Goals  OT Goals(current goals can now be found in the care plan section)  Progress towards OT goals: Progressing toward goals  Acute Rehab OT Goals Patient Stated Goal: agreeable to SNF OT Goal Formulation: With patient  Plan Discharge plan remains appropriate;Frequency remains appropriate    Co-evaluation                 AM-PAC OT "6 Clicks" Daily Activity     Outcome Measure   Help from another person eating meals?: A Little Help from another person taking care of personal grooming?: A Lot Help from another person toileting, which includes using toliet, bedpan, or urinal?: Total Help from another person bathing (including washing, rinsing, drying)?: Total Help from another person to put on and taking off regular upper body clothing?: Total Help from another person to put on and taking off regular lower body clothing?: Total 6 Click Score: 9    End of Session    OT Visit Diagnosis: Unsteadiness on feet (R26.81);Other abnormalities of gait and mobility (R26.89);Muscle weakness (generalized) (M62.81);Hemiplegia and hemiparesis;Pain Hemiplegia - Right/Left: Right   Activity Tolerance Patient tolerated treatment well   Patient Left in bed;with call bell/phone within reach;with bed alarm set;with nursing/sitter in room   Nurse Communication Mobility status;Other (comment) (resting hand splint)        Time: 8102-5486 OT Time Calculation (min): 12 min  Charges: OT  General Charges $OT Visit: 1 Visit OT Treatments $Orthotics Fit/Training: 8-22 mins  [name], [place] Pager [phone] Office [phone]     [name] 08/29/2020, 1:31 PM

## 2020-08-29 NOTE — Progress Notes (Signed)
Nutrition Follow-up  DOCUMENTATION CODES:   Non-severe (moderate) malnutrition in context of chronic illness  INTERVENTION:  -D/c Prosource, pt refusing -D/c Ensure, pt refusing -Magic cup TID with meals, each supplement provides 290 kcal and 9 grams of protein -Recommend providing pt with plastic cutlery for meals/snacks to reduce metal taste  If pt's diet cannot be advanced and/or if pt's po intake does not improve, should consider initiation of nutrition support (if aligned with pt's GOC)  NUTRITION DIAGNOSIS:   Moderate Malnutrition related to chronic illness (chronic renal insufficiency) as evidenced by percent weight loss, mild muscle depletion, energy intake < 75% for > or equal to 1 month (22% weight loss x 3 months).  Ongoing  GOAL:   Patient will meet greater than or equal to 90% of their needs  Progressing  MONITOR:   PO intake, Supplement acceptance, Diet advancement, Skin, Labs  REASON FOR ASSESSMENT:   Malnutrition Screening Tool    ASSESSMENT:   59 yo female admitted with severe AKI, hypokalemia, hypocalcemia, hypomagnesemia. PMH includes CVA with residual R sided paralysis, wheelchair bound, HTN, chronic renal insufficiency.  9/10 s/p EGD and flexible sigmoidoscopy  Per MD, EGD biopsies suggestive of chronic peptic duodenitis, chronic gastritis with reactive esophagitis, and likely acute, infectious, self-limited colitis.  Pt complaining of altered taste and states that everything tastes like metal. Recommend providing pt with plastic cutlery to help reduce metal taste when eating.   Pt states her N/V have significantly improved since last RD assessment; however, her intake has still not been sufficient to meet her needs. This does appear to be improving as pt ate 100% of her breakfast tray this morning. Pt has had limited po intake up until that point. Of note, pt has been refusing Ensure Enlive. Will discontinue Ensure Enlive and attempt ordering [employer]. Pt has so far refused Ensure, Boost Breeze, and Prosource. If pt's intake does not improve and if pt's diet is not able to be advanced, should consider initiation of nutrition support provided it is aligned with the pt's Advance.   Labs reviewed.  Medications: 8ml Prosource BID, Ensure Enlive TID, Megace, Protonix  Diet Order:   Diet Order            Diet full liquid Room service appropriate? Yes; Fluid consistency: Thin  Diet effective now                 EDUCATION NEEDS:   Not appropriate for education at this time  Skin:  Skin Assessment: Reviewed RN Assessment  Last BM:  9/16  Height:   Ht Readings from Last 1 Encounters:  09/03/2020 5' 6.5" (1.689 m)    Weight:   Wt Readings from Last 1 Encounters:  08/28/20 (!) 149.7 kg    Ideal Body Weight:  60.2 kg  BMI:  Body mass index is 52.47 kg/m.  Estimated Nutritional Needs:   Kcal:  1900-2200  Protein:  100-130 gm  Fluid:  1.9-2.2 L    Larkin Ina, MS, RD, LDN RD pager number and weekend/on-call pager number located in [place].

## 2020-08-29 NOTE — Plan of Care (Signed)
  Problem: Activity: Goal: Risk for activity intolerance will decrease Outcome: Progressing   Problem: Nutrition: Goal: Adequate nutrition will be maintained Outcome: Progressing   Problem: Elimination: Goal: Will not experience complications related to bowel motility Outcome: Progressing   Problem: Skin Integrity: Goal: Risk for impaired skin integrity will decrease Outcome: Progressing   

## 2020-08-30 LAB — GLUCOSE, CAPILLARY
Glucose-Capillary: 100 mg/dL — ABNORMAL HIGH (ref 70–99)
Glucose-Capillary: 87 mg/dL (ref 70–99)
Glucose-Capillary: 89 mg/dL (ref 70–99)
Glucose-Capillary: 92 mg/dL (ref 70–99)
Glucose-Capillary: 99 mg/dL (ref 70–99)

## 2020-08-30 LAB — RENAL FUNCTION PANEL
Albumin: 1.9 g/dL — ABNORMAL LOW (ref 3.5–5.0)
Anion gap: 13 (ref 5–15)
BUN: 38 mg/dL — ABNORMAL HIGH (ref 6–20)
CO2: 17 mmol/L — ABNORMAL LOW (ref 22–32)
Calcium: 7 mg/dL — ABNORMAL LOW (ref 8.9–10.3)
Chloride: 107 mmol/L (ref 98–111)
Creatinine, Ser: 4.25 mg/dL — ABNORMAL HIGH (ref 0.44–1.00)
GFR calc Af Amer: 13 mL/min — ABNORMAL LOW (ref >60)
GFR calc non Af Amer: 11 mL/min — ABNORMAL LOW (ref >60)
Glucose, Bld: 102 mg/dL — ABNORMAL HIGH (ref 70–99)
Phosphorus: 3.4 mg/dL (ref 2.5–4.6)
Potassium: 3.7 mmol/L (ref 3.5–5.1)
Sodium: 137 mmol/L (ref 135–145)

## 2020-08-30 LAB — MAGNESIUM: Magnesium: 1.7 mg/dL (ref 1.7–2.4)

## 2020-08-30 MED ORDER — ALUM & MAG HYDROXIDE-SIMETH 200-200-20 MG/5ML PO SUSP
15.0000 mL | ORAL | Status: DC | PRN
  Filled 2020-08-30: qty 30

## 2020-08-30 MED ORDER — ALUM & MAG HYDROXIDE-SIMETH 200-200-20 MG/5 ML NICU TOPICAL: 1.0000 "application " | TOPICAL | Status: DC | PRN

## 2020-08-30 NOTE — Evaluation (Signed)
Clinical/Bedside Swallow Evaluation Patient Details  Name: Cristina Jordan MRN: [id number] Date of Birth: November 12, 1961  Today's Date: 08/30/2020 Time: SLP Start Time (ACUTE ONLY): 1031 SLP Stop Time (ACUTE ONLY): 1047 SLP Time Calculation (min) (ACUTE ONLY): 16 min  Past Medical History:  Past Medical History:  Diagnosis Date  . Hypertension   . Stroke [facility])    Past Surgical History:  Past Surgical History:  Procedure Laterality Date  . BIOPSY  09/10/2020   Procedure: BIOPSY;  Surgeon: Rush Landmark Telford Nab., MD;  Location: [facility];  Service: Gastroenterology;;  EGD and COLON  . CHOLECYSTECTOMY    . ESOPHAGOGASTRODUODENOSCOPY (EGD) WITH PROPOFOL N/A 09/09/2020   Procedure: ESOPHAGOGASTRODUODENOSCOPY (EGD) WITH PROPOFOL;  Surgeon: Rush Landmark Telford Nab., MD;  Location: [facility];  Service: Gastroenterology;  Laterality: N/A;  . FLEXIBLE SIGMOIDOSCOPY N/A 08/26/2020   Procedure: FLEXIBLE SIGMOIDOSCOPY;  Surgeon: Irving Copas., MD;  Location: [facility];  Service: Gastroenterology;  Laterality: N/A;  . IR FLUORO GUIDE CV LINE LEFT  08/23/2020  . IR US GUIDE VASC ACCESS LEFT  08/23/2020   HPI:  59 y.o. female.  PMH Morbid obesity.  CVA w R hemiparesis, aphasia, facial droop.  At baseline is bed to wheelchair bound.  [place] Anemia, Hgb 9.9 in 06/2018.  Sigmoid diverticulosis, ventral hernias, uterine fibroids per CT wo contrast in 06/2018.  S/p cholecystectomy, complicated by surgical sponge left in place requiring laparotomy (at [facility] in [place]).  CXR on 08/26/2020 negative for acute processes; MRI revealed on 08/16/20 Area of hypoattenuation in the periventricular white matter of the left frontal lobe extends into the left basal ganglia/external capsule. This may represent a chronic infarct, given this finding is mostly stable when compared to the maxillofacial CT dated May 27, 2020. Further evaluation with MRI of the brain on a non-emergent basis should be considered, if no  comparison studies of the brain are available to confirm chronicity. 2. No evidence of intracranial hemorrhage; pt c/o "pills getting stuck" intermittently; BSE generated.  Assessment / Plan / Recommendation Clinical Impression  Limited BSE completed d/t pt compliance/ refusal of solids/puree.  Pt primarily consuming thin/full liquids per report from pt/nursing; c/o "pills getting stuck" prompted BSE; pt refusing puree consistency during BSE, but strategies given for pill propulsion including using hot liquids, repetitive, effortful swallows and use of liquid wash.  Pt in agreement.  Speech noted to be dysarthric with approximately 75% intelligibility during simple conversation.  Medical issues affecting consumption as well; may consider esophagram.  No  overt s/s of aspiration noted with liquids during BSE.  Pt independently using small sips while consuming liquids.  Recommend continue full liquid diet with ST f/u for potential progression to D1/thin if pt able. Thank you for this consult. SLP Visit Diagnosis: Dysphagia, unspecified (R13.10)    Aspiration Risk  Mild aspiration risk    Diet Recommendation   Full liquids/thin liquids  Medication Administration: Whole meds with puree    Other  Recommendations Recommended Consults: Consider esophageal assessment Oral Care Recommendations: Oral care BID   Follow up Recommendations Other (comment) (TBD)      Frequency and Duration min 1 x/week  1 week       Prognosis Prognosis for Safe Diet Advancement: Fair Barriers to Reach Goals: Severity of deficits Barriers/Prognosis Comment: esophageal component      Swallow Study   General Date of Onset: 08/26/2020 HPI: 59 y.o. female.  PMH Morbid obesity.  CVA w R hemiparesis, aphasia, facial droop.  At baseline is bed to  wheelchair bound.   Anemia, Hgb 9.9 in 06/2018.  Sigmoid diverticulosis, ventral hernias, uterine fibroids per CT wo contrast in 06/2018.  S/p cholecystectomy, complicated by  surgical sponge left in place requiring laparotomy (at [facility] in [place]).  Type of Study: Bedside Swallow Evaluation Diet Prior to this Study: Thin liquids;Other (Comment) (full liquids) Temperature Spikes Noted: No Respiratory Status: Room air History of Recent Intubation: No Behavior/Cognition: Alert;Uncooperative Oral Cavity Assessment: Within Functional Limits Oral Care Completed by SLP: No Oral Cavity - Dentition: Edentulous;Other (Comment) (Had most teeth removed prior to hospitalization) Vision: Functional for self-feeding Self-Feeding Abilities: Able to feed self;Needs assist Patient Positioning: Upright in bed Baseline Vocal Quality: Low vocal intensity;Other (comment) (dysarthric) Volitional Cough: Strong Volitional Swallow: Able to elicit    Oral/Motor/Sensory Function Overall Oral Motor/Sensory Function: Mild impairment Facial ROM: Reduced right Facial Symmetry: Abnormal symmetry right Facial Strength: Reduced right Lingual ROM: Within Functional Limits Lingual Symmetry: Abnormal symmetry right Lingual Strength: Reduced   Ice Chips Ice chips: Not tested   Thin Liquid Thin Liquid: Within functional limits Presentation: Straw Other Comments: Pt independently takes small sips    Nectar Thick Nectar Thick Liquid: Not tested   Honey Thick Honey Thick Liquid: Not tested   Puree Puree: Not tested Other Comments:  (Refused)   Solid     Solid: Not tested Other Comments: Refused      Elvina Sidle, M.S.,CCC-SLP 08/30/2020,11:17 AM

## 2020-08-30 NOTE — Progress Notes (Signed)
Patient experienced heartburn along with belching, RN notified MD. MD prescribed Maalox/mylanta, patient refused this medication when offered to them> RN provided patient education and will continue to monitor the patient.

## 2020-08-30 NOTE — Progress Notes (Signed)
Subjective:   Patient was seen and evaluated at bedside this morning. No acute events overnight. She continues to have diarrhea and vaginal bleeding occasionally.  She just had small amount of nonbloody emesis.  Denies abdominal pain, headache, shortness of breath or chest pain.  She mentions that she wants to go home rather than going to nursing facility.  Objective:  Vital signs in last 24 hours: Vitals:   08/29/20 0541 08/29/20 1633 08/29/20 2053 08/30/20 0440  BP: (!) 148/92 124/76 135/83 (!) 147/94  Pulse: (!) 110 (!) 108 (!) 109 (!) 110  Resp: 16 18 15 15   Temp: 98.3 F (36.8 C) 98.2 F (36.8 C) 98.3 F (36.8 C) 98.6 F (37 C)  TempSrc:      SpO2: 100% 100% 100% 100%  Weight:   (!) 149.7 kg   Height:       General: Morbidly obese, no acute distress and appears comfortable. Respiratory: Anterior and lateral lung exam clear Cardiovascular: Tachycardic, regular rhythm, no murmur Neurology: (Residual) right sided hemiparesis Abdomen: Soft, nondistended, nontender to palpation.  Bowel sounds present.   Assessment/Plan:  Active Problems:   Rhabdomyolysis   Hypokalemia   Hypocalcemia   Acute renal failure (HCC)   Malnutrition of moderate degree   Palliative care by specialist   DNR (do not resuscitate) discussion   Lethargy   DNR (do not resuscitate)   Weakness generalized   Vaginal bleeding   Colitis   Chronic gastritis without bleeding  # Acute Colitis and Chronic Duodenitis/Gastritis EGD biopsies suggestive of chronic peptic duodenitis, chronic gastritis with reactive esophagitis, and likely acute, infectious, self-limited colitis.  Still has nausea and vomited once this morning.  -Continue LR 75 mL/h -continue Protonix twice daily for 4 weeks and then daily for 8 weeks -Repeat EGD in 3 months to document healing -No NSAID -Continue to encourage p.o. intake -Continue B12 1000 MCG for total of 7 days -Continue scopolamine patch and Emetrol as needed -No  Imodium per GI  # Severe AKI on likely Chronic Renal Failure stage IIIb with Intermittent, Mild Metabolic Acidosis  Creatinine on arrival was 10.23 (up from 1.7 in 2019), BUN 73, GFR 4, all continuing to imrpove. Likely prerenal due to prolonged poor PO intake and diarrhea. Renal Ultrasound showed increased echogenicity consistent with medical renal disease. Patient appears euvolemic. She has mild NAGMA. - Nephrology signed off given stability/improvement; however, patient is a poor dialysis candidate. PT and OT recommend SNF placement.  -Fortunately her kidney function improved.  Creatinine now down to 4.25. -Dispo pending discussing SNF with family.  Appreciate social worker follow-up. -Continue sodium bicarb 650 mg twice daily -BMP daily  # Electrolyte abnormality #Prolonged QT -BMP, mag daily -Replacing electrolytes as needed  #Vaginal bleeding #Acute on chronic normocytic anemia: Stable after few transfusion. Likely due to vaginal bleeding and worsening CKD. Hgb stable. - OB-GYN consulted; patient will be scheduled to follow up OP for endometrial Bx at [facility] [Dr. Emeterio Reeve will message the office] if kidney function continues to improve - Continue megestrol 40mg  PO twice daily  - Will hold off on iron for now given GI inflammation though will likely require this vs. EPO in future - On SCDs for DVT PPx until bleeding resolves  Prior to Admission Living Arrangement: Home  Anticipated Discharge Location: SNF  Barriers to Discharge: Decreased PO intake, SNF pending  Code Status: DNR/DNI Diet: advanced as tolerated; renal without fluid restriction IVF: LR 72mL/hr DVT PPx: SCD's given vaginal bleeding  [name], MD 08/30/2020, 6:58 AM Pager: [phone] After 5pm on weekdays and 1pm on weekends: On Call pager [phone]

## 2020-08-31 LAB — RENAL FUNCTION PANEL
Albumin: 1.8 g/dL — ABNORMAL LOW (ref 3.5–5.0)
Anion gap: 12 (ref 5–15)
BUN: 36 mg/dL — ABNORMAL HIGH (ref 6–20)
CO2: 17 mmol/L — ABNORMAL LOW (ref 22–32)
Calcium: 6.8 mg/dL — ABNORMAL LOW (ref 8.9–10.3)
Chloride: 108 mmol/L (ref 98–111)
Creatinine, Ser: 3.85 mg/dL — ABNORMAL HIGH (ref 0.44–1.00)
GFR calc Af Amer: 14 mL/min — ABNORMAL LOW (ref >60)
GFR calc non Af Amer: 12 mL/min — ABNORMAL LOW (ref >60)
Glucose, Bld: 103 mg/dL — ABNORMAL HIGH (ref 70–99)
Phosphorus: 3 mg/dL (ref 2.5–4.6)
Potassium: 3.8 mmol/L (ref 3.5–5.1)
Sodium: 137 mmol/L (ref 135–145)

## 2020-08-31 LAB — MAGNESIUM: Magnesium: 1.6 mg/dL — ABNORMAL LOW (ref 1.7–2.4)

## 2020-08-31 LAB — GLUCOSE, CAPILLARY
Glucose-Capillary: 94 mg/dL (ref 70–99)
Glucose-Capillary: 91 mg/dL (ref 70–99)

## 2020-08-31 MED ORDER — PANTOPRAZOLE SODIUM 40 MG IV SOLR
40.0000 mg | Freq: Two times a day (BID) | INTRAVENOUS | Status: DC
  Administered 2020-08-31 – 2020-09-01 (×2): 40 mg via INTRAVENOUS
  Filled 2020-08-31 (×2): qty 40

## 2020-08-31 MED ORDER — DICYCLOMINE HCL 10 MG/5ML PO SOLN
10.0000 mg | Freq: Three times a day (TID) | ORAL | Status: AC
  Administered 2020-09-01 (×2): 10 mg via ORAL
  Filled 2020-08-31 (×6): qty 5

## 2020-08-31 MED ORDER — SODIUM BICARBONATE 650 MG PO TABS: 650.0000 mg | ORAL_TABLET | Freq: Two times a day (BID) | ORAL | Status: DC

## 2020-08-31 MED ORDER — MAGNESIUM SULFATE 2 GM/50ML IV SOLN
2.0000 g | Freq: Once | INTRAVENOUS | Status: AC
  Administered 2020-08-31: 2 g via INTRAVENOUS
  Filled 2020-08-31: qty 50

## 2020-08-31 MED ORDER — SODIUM BICARBONATE 650 MG PO TABS
1300.0000 mg | ORAL_TABLET | Freq: Two times a day (BID) | ORAL | Status: DC
  Administered 2020-09-08 (×2): 1300 mg via ORAL
  Filled 2020-08-31 (×15): qty 2

## 2020-08-31 MED ORDER — CALCIUM CARBONATE ANTACID 1250 MG/5ML PO SUSP
500.0000 mg | Freq: Two times a day (BID) | ORAL | Status: AC
  Filled 2020-08-31 (×3): qty 5

## 2020-08-31 NOTE — Progress Notes (Cosign Needed Addendum)
ADDENDUM:  Patient complained about difficulty taking pills. She mentions the pills are difficult to swallow and may stuck. Given she continuous to have acidosis on current dose of PO sodium bicarb, I increase the dose of PO bicarb to 1300 mg BID and since she has difficulty swallowing pills, I try to decrease numbers of other po pills on her order list. I converted some of her pills to suspension or IV form as below:  -Switching PO Pantoprazole to IV  -Switching Tums to suspension Ca Carbonate -Bentyl tablet to liquid  [name], MD [id number] 08/31/2020, 4:07 PM Pager: [phone]

## 2020-08-31 NOTE — Progress Notes (Signed)
Subjective:  Ms. North complains of difficulty swallowing pills and also some nausea. She had some heart burn last night. Mentions that she wants to go home. All her questions and concerns were addressed.  Objective:  Vital signs in last 24 hours: Vitals:   08/30/20 0440 08/30/20 0942 08/30/20 1651 08/30/20 2117  BP: (!) 147/94 131/86 131/74 (!) 150/72  Pulse: (!) 110 (!) 106 (!) 108 (!) 110  Resp: 15 16 18 14   Temp: 98.6 F (37 C) 98.4 F (36.9 C) 98.6 F (37 C) 98.8 F (37.1 C)  TempSrc:  Oral  Oral  SpO2: 100% 100% 100% 99%  Weight:      Height:       General: Morbidly obese, no acute distress and appears comfortable. Respiratory: Anterior and lateral lung exam clear Cardiovascular: Tachycardic, regular rhythm, no murmur Neurology: (Residual) right sided hemiparesis Abdomen: Soft, nondistended, nontender to palpation.  Bowel sounds present.   Assessment/Plan:  Active Problems:   Rhabdomyolysis   Hypokalemia   Hypocalcemia   Acute renal failure (HCC)   Malnutrition of moderate degree   Palliative care by specialist   DNR (do not resuscitate) discussion   Lethargy   DNR (do not resuscitate)   Weakness generalized   Vaginal bleeding   Colitis   Chronic gastritis without bleeding  # Acute Colitis and Chronic Duodenitis/Gastritis: likely acute, infectious, self-limited colitis.  #Nause and occasional emesis #Dysphagia #Electrolyte abnormality    Still has some nausea and vomiting EGD biopsies suggestive of chronic peptic duodenitis, chronic gastritis with reactive esophagitis, and  Still has nausea and vomited once this morning.  -Continue LR 75 mL/h -Continue Protonix twice daily for total of 4 weeks and then daily for 8 weeks -f/u with GI outpatient to repeat EGD in 3 months to document healing -No NSAID -Continue to encourage p.o. intake -Continue B12 1000 MCG for total of 7 days -Continue scopolamine patch and Emetrol as needed -No Imodium per  GI -Replacing electrolytes. Mg 1.6. Gave 2 Gr of IV Mag   # Severe AKI on likely Chronic Renal Failure stage IIIb with Intermittent, Mild Metabolic Acidosis  #Mild metabolic acidosis Creatinine on arrival was 10.23 (up from 1.7 in 2019), gradualy improved to 3.85.   Likely prerenal due to prolonged poor PO intake and diarrhea. Renal Ultrasound showed increased echogenicity consistent with medical renal disease. Patient appears euvolemic. She has mild NAGMA.  -Fortunately her kidney function is improving gradually. Creatinine now down to 3.85 -Dispo pending discussing SNF with family.  Appreciate social worker follow-up. -BMP daily -On sodium bicarb 650 mg twice daily. (Ok to hold today if patient can not tolerate PO pills and will reevaluate again tomorrow)  # Electrolyte abnormality #Prolonged QT -BMP, mag daily -Replacing electrolytes as needed  #Vaginal bleeding #Acute on chronic normocytic anemia: Stable after few transfusion. Likely due to vaginal bleeding and worsening CKD. Hgb stable. - OB-GYN consulted; patient will be scheduled to follow up OP for endometrial Bx at [place] [Dr. Emeterio Reeve will message the office] if kidney function continues to improve - Continue megestrol 40mg  PO twice daily  - Will hold off on iron for now given GI inflammation though will likely require this vs. EPO in future - On SCDs for DVT PPx until bleeding resolves  Prior to Admission Living Arrangement: Home  Anticipated Discharge Location: SNF  Barriers to Discharge: Decreased PO intake, Still needs IV fluid and electrolyte replacement,  Code Status: DNR/DNI Diet: advanced as tolerated; renal without fluid  restriction IVF: LR 22mL/hr DVT PPx: SCD's given vaginal bleeding  [name], MD 08/31/2020, 6:28 AM Pager: [phone] After 5pm on weekdays and 1pm on weekends: On Call pager [phone]

## 2020-09-01 DIAGNOSIS — I129 Hypertensive chronic kidney disease with stage 1 through stage 4 chronic kidney disease, or unspecified chronic kidney disease: Secondary | ICD-10-CM

## 2020-09-01 DIAGNOSIS — R0981 Nasal congestion: Secondary | ICD-10-CM

## 2020-09-01 DIAGNOSIS — K2950 Unspecified chronic gastritis without bleeding: Secondary | ICD-10-CM | POA: Diagnosis not present

## 2020-09-01 DIAGNOSIS — M6282 Rhabdomyolysis: Secondary | ICD-10-CM | POA: Diagnosis not present

## 2020-09-01 DIAGNOSIS — D631 Anemia in chronic kidney disease: Secondary | ICD-10-CM

## 2020-09-01 DIAGNOSIS — N179 Acute kidney failure, unspecified: Secondary | ICD-10-CM | POA: Diagnosis not present

## 2020-09-01 DIAGNOSIS — K449 Diaphragmatic hernia without obstruction or gangrene: Secondary | ICD-10-CM

## 2020-09-01 DIAGNOSIS — R1110 Vomiting, unspecified: Secondary | ICD-10-CM

## 2020-09-01 LAB — CBC
HCT: 24.1 % — ABNORMAL LOW (ref 36.0–46.0)
Hemoglobin: 7.6 g/dL — ABNORMAL LOW (ref 12.0–15.0)
MCH: 29 pg (ref 26.0–34.0)
MCHC: 31.5 g/dL (ref 30.0–36.0)
MCV: 92 fL (ref 80.0–100.0)
Platelets: 251 10*3/uL (ref 150–400)
RBC: 2.62 MIL/uL — ABNORMAL LOW (ref 3.87–5.11)
RDW: 17.9 % — ABNORMAL HIGH (ref 11.5–15.5)
WBC: 11.4 10*3/uL — ABNORMAL HIGH (ref 4.0–10.5)
nRBC: 0 % (ref 0.0–0.2)

## 2020-09-01 LAB — RENAL FUNCTION PANEL
Albumin: 1.9 g/dL — ABNORMAL LOW (ref 3.5–5.0)
Anion gap: 9 (ref 5–15)
BUN: 34 mg/dL — ABNORMAL HIGH (ref 6–20)
CO2: 18 mmol/L — ABNORMAL LOW (ref 22–32)
Calcium: 6.9 mg/dL — ABNORMAL LOW (ref 8.9–10.3)
Chloride: 110 mmol/L (ref 98–111)
Creatinine, Ser: 3.58 mg/dL — ABNORMAL HIGH (ref 0.44–1.00)
GFR calc Af Amer: 16 mL/min — ABNORMAL LOW (ref >60)
GFR calc non Af Amer: 13 mL/min — ABNORMAL LOW (ref >60)
Glucose, Bld: 100 mg/dL — ABNORMAL HIGH (ref 70–99)
Phosphorus: 2.9 mg/dL (ref 2.5–4.6)
Potassium: 4 mmol/L (ref 3.5–5.1)
Sodium: 137 mmol/L (ref 135–145)

## 2020-09-01 LAB — GLUCOSE, CAPILLARY
Glucose-Capillary: 92 mg/dL (ref 70–99)
Glucose-Capillary: 96 mg/dL (ref 70–99)

## 2020-09-01 LAB — MAGNESIUM: Magnesium: 1.9 mg/dL (ref 1.7–2.4)

## 2020-09-01 MED ORDER — MEGESTROL ACETATE 40 MG PO TABS
40.0000 mg | ORAL_TABLET | Freq: Two times a day (BID) | ORAL | Status: DC
  Administered 2020-09-04 – 2020-09-08 (×2): 40 mg via ORAL
  Filled 2020-09-01 (×23): qty 1

## 2020-09-01 MED ORDER — LACTATED RINGERS IV SOLN: INTRAVENOUS | Status: DC

## 2020-09-01 MED ORDER — PHENOL 1.4 % MT LIQD
1.0000 | OROMUCOSAL | Status: DC | PRN
  Administered 2020-09-03: 1 via OROMUCOSAL
  Filled 2020-09-01: qty 177

## 2020-09-01 MED ORDER — PANTOPRAZOLE SODIUM 40 MG PO TBEC
40.0000 mg | DELAYED_RELEASE_TABLET | Freq: Two times a day (BID) | ORAL | Status: DC
  Filled 2020-09-01 (×3): qty 1

## 2020-09-01 NOTE — TOC Initial Note (Signed)
Transition of Care [facility]) - Initial/Assessment Note    Patient Details  Name: Cristina Jordan MRN: [id number] Date of Birth: 1961-07-27  Transition of Care [facility]) CM/SW Contact:    Emeterio Reeve, [place] Phone Number: 09/01/2020, 4:20 PM  Clinical Narrative:                  CSW met with pt at bedside. CSW introduced self and explained her role at the hospital.  Pt stated PTA she was living at home with her daughter [name]. Pt state [name] provided all of her care.   CSW reviwed pt reccs with pt. Pt stated she is not going to a SNF and will return home with her daughter. Pt gave csw permission to contact daughter. Pt refused to allow csw to fax info out to facilities.   CSW spoke to [name] by phone [phone]. [name] stated that pt is basically paralyzed on one side and is mostly bed bound. [name] states she does provide all of her care. [name] stated that she would prefer for her mom to go to snf and build some strength before returning home. [name] states its difficult caring for her mom at this time. [name] does not agree with her mom with returning home.   Pt is oriented x4 and capable of making her own decisions at this time. CSW encourage [name] and other family members to keep talking to her about her options and csw will come back to talk to her the next day.   TOC will follow.   Expected Discharge Plan: Skilled Nursing Facility Barriers to Discharge: Continued Medical Work up   Patient Goals and CMS Choice Patient states their goals for this hospitalization and ongoing recovery are:: To return home CMS Medicare.gov Compare Post Acute Care list provided to:: Patient Choice offered to / list presented to : Patient, Adult Children  Expected Discharge Plan and Services Expected Discharge Plan: [place] arrangements for the past 2 months: Single Family Home                                      Prior Living  Arrangements/Services Living arrangements for the past 2 months: Single Family Home Lives with:: Adult Children Patient language and need for interpreter reviewed:: Yes        Need for Family Participation in Patient Care: Yes (Comment) Care giver support system in place?: Yes (comment) Current home services: DME Criminal Activity/Legal Involvement Pertinent to Current Situation/Hospitalization: No - Comment as needed  Activities of Daily Living Home Assistive Devices/Equipment: Blood pressure cuff, Eyeglasses, Wheelchair ADL Screening (condition at time of admission) Patient's cognitive ability adequate to safely complete daily activities?: Yes Is the patient deaf or have difficulty hearing?: No Does the patient have difficulty seeing, even when wearing glasses/contacts?: No Does the patient have difficulty concentrating, remembering, or making decisions?: No Patient able to express need for assistance with ADLs?: Yes Does the patient have difficulty dressing or bathing?: Yes Independently performs ADLs?: No Communication: Independent Dressing (OT): Dependent Is this a change from baseline?: Pre-admission baseline Grooming: Needs assistance Is this a change from baseline?: Pre-admission baseline Feeding: Needs assistance Is this a change from baseline?: Pre-admission baseline Bathing: Dependent Is this a change from baseline?: Pre-admission baseline Toileting: Dependent Is this a change from baseline?: Pre-admission baseline In/Out Bed: Dependent Is this a change from baseline?: Pre-admission baseline  Walks in Home: Dependent Is this a change from baseline?: Pre-admission baseline Does the patient have difficulty walking or climbing stairs?: Yes (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) Weakness of Legs: Both Weakness of Arms/Hands: Right  Permission Sought/Granted                  Emotional Assessment Appearance:: Appears older than stated  age Attitude/Demeanor/Rapport: Engaged Affect (typically observed): Agitated, Appropriate Orientation: : Oriented to Self, Oriented to Place, Oriented to  Time, Oriented to Situation Alcohol / Substance Use: Not Applicable Psych Involvement: No (comment)  Admission diagnosis:  Hypocalcemia [E83.51] Rhabdomyolysis [M62.82] Hypokalemia [E87.6] Acute renal failure, unspecified acute renal failure type ([place]) [N17.9] Anemia, unspecified type [D64.9] Patient Active Problem List   Diagnosis Date Noted  . Vaginal bleeding   . Colitis   . Chronic gastritis without bleeding   . DNR (do not resuscitate)   . Weakness generalized   . Malnutrition of moderate degree 08/18/2020  . Palliative care by specialist   . DNR (do not resuscitate) discussion   . Lethargy   . Acute renal failure ([place])   . Rhabdomyolysis 09/07/2020  . Hypokalemia 08/14/2020  . Hypocalcemia 08/25/2020   PCP:  Patient, No Pcp Per Pharmacy:   [id number] [id number] [id number]: [phone]: [phone]    Social Determinants of Health (SDOH) Interventions    Readmission Risk Interventions No flowsheet data found.  MEmeterio Reeve LLatanya Presser LMoapa ValleySocial Worker [phone]

## 2020-09-01 NOTE — Progress Notes (Signed)
Daily Rounding Note  09/01/2020, 4:10 PM  LOS: 16 days  Teaching service calling back GI due to ongoing vomiting, inability to tolerate po SUBJECTIVE:   Chief complaint:      See initial GI consult 9/9 of N/V, abdominal pain.  Had been admitted 5 d earlier w pathogen negative diarrhea, mild rhabdo, AKI/met acidosis, electrolyte derangements, hyperglycemia.    CTAP w/o contrast: 1. S/O diffuse colonic wall thickening with pericolonic edema involving the descending and sigmoid colon, suspicious for colitis. This may be infectious or inflammatory. 2. Distal colonic diverticulosis without focal diverticulitis. 3. Right paramidline ventral abdominal wall hernia contains short segment of small bowel without obstruction or inflammatory change.  Left paramidline ventral abdominal wall hernia at the same level contains only fat. Anemic w Hgb 8.5.  Normal LFTs.  Resolving electrolyte abnormalities.   9/10 EGD:  - Normal mucosa was found in the entire esophagus. - Tortuous esophagus distally. - 4 cm hiatal hernia. - Erythematous mucosa in the gastric body and antrum. - No other gross lesions in the stomach. Biopsied. - Non-bleeding duodenal ulcers with a clean ulcer base (Forrest Class III). - Duodenitis. 9/10 Flex Sig:  - Preparation of the colon was inadequate. - Hemorrhoids found on digital rectal exam. - Stool in the entire examined colon - adherent to the wall; lavaged. - Normal mucosa noted in most regions of the left colon that were not full of stool.  Biopsied to rule out chronic colitis, but does not have appearance of IBD. - Diverticulosis in the recto-sigmoid colon and in the sigmoid colon. - Non-bleeding non-thrombosed external and internal hemorrhoids. Pathology: A. DUODENUM, BIOPSY:  - Chronic duodenitis with surface gastric foveolar metaplasia,  suggestive of peptic duodenitis  - Warthin-Starry stain is negative for  Helicobacter pylori  B. STOMACH, BIOPSY:  - Gastric antral and oxyntic mucosa with chronic gastritis  - Warthin Starry stain is negative for Helicobacter pylori  C. ESOPHAGUS, BIOPSY:  - Esophageal squamous mucosa with reactive changes  - Negative for increased intraepithelial eosinophils  D. COLON, RANDOM, BIOPSY:  - Colonic mucosa with edema, reactive changes and increased neutrophils within lamina propria. - Negative for features of chronicity, granulomas or features of  microscopic colitis   Yesterday and today vomited non-bloody emesis.  Small amounts.  C/o pain in throat.     SLP upgraded pt's diet today to chopped w thin liquids.  Pt is asking for solid food, she is hungry.   Meds include scopolamine patch and Protonix IV.    OBJECTIVE:         Vital signs in last 24 hours:    Temp:  [98.5 F (36.9 C)-98.9 F (37.2 C)] 98.5 F (36.9 C) (09/20 0905) Pulse Rate:  [107-110] 109 (09/20 0905) Resp:  [16-18] 18 (09/20 0905) BP: (144-149)/(70-96) 148/88 (09/20 0905) SpO2:  [99 %-100 %] 99 % (09/20 0905) Last BM Date: 08/31/20 Filed Weights   08/27/20 0525 08/28/20 2225 08/29/20 2053  Weight: (!) 159.7 kg (!) 149.7 kg (!) 149.7 kg   General: obese, comfortable   Heart: RRR Chest: clear bil Abdomen: soft, obese, NT.  Active BS  Extremities: no CCE Neuro/Psych:  Right hemiparesis.  No tremors.    Intake/Output from previous day: 09/19 0701 - 09/20 0700 In: 2220 [P.O.:1200; I.V.:1020] Out: 1100 [Urine:1100]  Intake/Output this shift: Total I/O In: 120 [P.O.:120] Out: -   Lab Results: No results for input(s): WBC, HGB, HCT, PLT in the last  72 hours. BMET Recent Labs    08/30/20 0332 08/31/20 0402 09/01/20 0437  NA 137 137 137  K 3.7 3.8 4.0  CL 107 108 110  CO2 17* 17* 18*  GLUCOSE 102* 103* 100*  BUN 38* 36* 34*  CREATININE 4.25* 3.85* 3.58*  CALCIUM 7.0* 6.8* 6.9*   LFT Recent Labs    08/30/20 0332 08/31/20 0402 09/01/20 0437  ALBUMIN 1.9* 1.8* 1.9*    PT/INR No results for input(s): LABPROT, INR in the last 72 hours. Hepatitis Panel No results for input(s): HEPBSAG, HCVAB, HEPAIGM, HEPBIGM in the last 72 hours.  Studies/Results: No results found.   Scheduled Meds: . amLODipine  10 mg Oral Daily  . calcium carbonate (dosed in mg elemental calcium)  500 mg of elemental calcium Oral BID  . Chlorhexidine Gluconate Cloth  6 each Topical Daily  . dicyclomine  10 mg Oral TID AC  . megestrol  40 mg Oral BID  . pantoprazole (PROTONIX) IV  40 mg Intravenous Q12H  . scopolamine  1 patch Transdermal Q72H  . sodium bicarbonate  1,300 mg Oral BID  . sodium chloride flush  10-40 mL Intracatheter Q12H  . sodium chloride flush  3 mL Intravenous Q12H   Continuous Infusions: . sodium chloride 500 mL (08/31/20 2152)  . lactated ringers 50 mL/hr at 09/01/20 1221   PRN Meds:.sodium chloride, acetaminophen, alum & mag hydroxide-simeth, anti-nausea, fluticasone, lidocaine-EPINEPHrine, sodium chloride flush   ASSESMENT:   *  Vomiting w/o nausea in pt w hx CVA and s/p multiple dental extractions ~ 9/6.  ? Is this regurgitation a/w esophageal dysmotility rather than true vomiting? Gastritis on EGD, on Protonix IV currently.    *   Dysphagia.  SLP rec is for chopped diet and thin liquids.     *   ? Colitis.  c diff and PCR negative.  Biopsies unrevealing.  ? Resolving infectious colitis.  Diarrhea resolved  *   CKD, AKI.  Mild rhabdo at admission.  CKD persists.    *    Post menopausal bleeding, thickened endometrium.  On Megace for this.    *   Anemia.  Treated w transfusion.  Low B12, IM B12 initiated.    PLAN   *   Allow chopped diet, thin liquids.  Ideally sit up right for all meals and for 60 minutes aferwards.  Continue Protonix, could be po.  *   Switch to po Protonix 40 mg bid.    *   UGI series or Ba esophagram??       Azucena Freed  09/01/2020, 4:10 PM Phone [phone]

## 2020-09-01 NOTE — Progress Notes (Signed)
Patient refused all PO meds this AM, complaining of pain in throat from vomiting. After educating on pills that had been changed to suspension, patient still refused.

## 2020-09-01 NOTE — Progress Notes (Signed)
Subjective:   Today, Ms. Harr is visibly upset. She states that she is hungry and frustrated that nursing staff have not given her food such as beans and spinach that she has requested. She states that she does continue to feel nauseas when she attempts to drink too much and says that she "vomited all night" causing her throat to become sore. She denies any abdominal pain currently and believes her diarrhea has resolved. She states her last stool was not as fluid. Nursing staff report continued vaginal bleeding. She feels her weakness is progressing but denies any other symptoms. She is expressing a desire to go home.   Objective:  Vital signs in last 24 hours: Vitals:   08/31/20 0700 08/31/20 1640 08/31/20 2111 09/01/20 0534  BP: (!) 144/75 (!) 149/70 (!) 147/96 (!) 144/91  Pulse: (!) 107 (!) 110 (!) 109 (!) 107  Resp: 16 18 16 16   Temp: 98.2 F (36.8 C) 98.6 F (37 C) 98.9 F (37.2 C) 98.6 F (37 C)  TempSrc:  Oral Oral Oral  SpO2: 100% 100% 100% 99%  Weight:      Height:       General: Patient is morbidly obese. Visibly upset. HENT: Mucus membranes are moist. Edentulous.  Respiratory: Anterior lung sounds CTA bilaterally. Cardiovascular: Rate is tachycardic. rhythm is regular. No murmurs, rubs, or gallops.  Neurological: Patient has R-sided hemiparesis.  Abdominal: Soft without TTP, guarding, or rebound. No distention. Bowel sounds intact. Genitourinary: Foley is in place, draining urine. Skin: Slightly diaphoretic.  Psychiatric: Patient is tearful, visibly upset.   Assessment/Plan:  Active Problems:   Rhabdomyolysis   Hypokalemia   Hypocalcemia   Acute renal failure (HCC)   Malnutrition of moderate degree   Palliative care by specialist   DNR (do not resuscitate) discussion   Lethargy   DNR (do not resuscitate)   Weakness generalized   Vaginal bleeding   Colitis   Chronic gastritis without bleeding  # Acute Colitis and Chronic Duodenitis/Gastritis EGD  biopsies suggestive of chronic peptic duodenitis, chronic gastritis with reactive esophagitis, and likely acute, infectious, self-limited colitis. Patient's nausea and vomiting have continued/worsened, although abdominal pain and diarrhea have resolved today per patient.  - Continue PPI PO BID for 4 weeks then drop to 1x daily for 8 weeks followed by EGD in 3 months to document healing, per GI. - Avoid NSAIDs - Attempted to transition many of her oral medications to liquids, although patient continues not to take these regularly - Will discuss with speech therapy and obtain repeat swallow study to see if able to tolerate more solid foods - Continue to encourage fluid intake - B12 1000 mcg x 7 days - Continue scopolamine patch and Emetrol PRN - Hold Imodium given GI recs   # Severe AKI on likely Chronic Renal Failure stage IIIb with Intermittent, Mild Metabolic Acidosis  Creatinine on arrival was 10.23 (up from 1.7 in 2019), BUN 73, GFR 4, all continuing to imrpove. Likely prerenal due to prolonged poor PO intake and diarrhea. Renal Ultrasound showed increased echogenicity consistent with medical renal disease. Patient appears euvolemic. Continues to have stable NAGMA. - Nephrology signed off given stability/improvement; however, patient is a poor dialysis candidate. PT and OT recommend SNF placement; patient has expressed desire to go home - Ordered TOC SNF placement consult; dispo will need to be discussed with family  - Decreased LR to 84mL/hr given net positive intake  - Monitor Strict I&O - Will continue NaHCO3 1.3g BID  -  Continue to monitor daily renal function panel   # Electrolyte Abnormalities with Moderate Malnutrition Patient has only been consuming broth for at least 2 weeks at home with N/V/D. Nutrition endorses 22% weight loss over past 3 months.  Potassium on arrival was less than 2, corrected calcium was 6.8, magnesium was 1.4 likely secondary to poor p.o. intake, nausea vomiting  and diarrhea. Prolonged QTc. IJ tunnel cath placed 7/61/51 without complication. - No electrolyte replacement required today - Albumin low, encouraged PO intake / check repeat SLP swallow evaluation - Continue to replace as needed  # Stable Normocytic Anemia Likely due to vaginal bleeding and worsening CKD. Her nausea may be 2/2 undiagnosed gynecological malignancy. - OB-GYN consulted; patient will be scheduled to follow up OP for endometrial Bx at [place] [Dr. Emeterio Reeve will message the office] if kidney function continues to improve - Patient has not been receiving Megestrol 40mg  BID regularly; encourage regular medication dosing - Will hold off on iron for now given GI inflammation though will likely require this vs. EPO in future - On SCDs for DVT PPx until bleeding resolves  # Hypertension  Blood pressures slightly elevated to 834'P systolic on current regimen; likely in setting of discomfort.   - Continue to monitor on amlodipine 10mg  daily - Decreased LR from 75 to 78mL/hr  # Nasal Congestion, resolved  - Flonase PRN  Prior to Admission Living Arrangement: Home  Anticipated Discharge Location: SNF  Barriers to Discharge: Decreased PO intake, SNF pending  Code Status: DNR/DNI Diet: advanced as tolerated; renal without fluid restriction IVF: LR 61mL/hr DVT PPx: SCD's given vaginal bleeding  Cristina Bennett, MD 09/01/2020, 11:50 AM Pager: [phone] After 5pm on weekdays and 1pm on weekends: On Call pager [phone]

## 2020-09-01 NOTE — Progress Notes (Signed)
  Speech Language Pathology Treatment: Dysphagia  Patient Details Name: Cristina Jordan MRN: [id number] DOB: 1961/07/26 Today's Date: 09/01/2020 Time: 1400-1410 SLP Time Calculation (min) (ACUTE ONLY): 10 min  Assessment / Plan / Recommendation Clinical Impression  Session targeted dysphagia with specific focus on oral mastication and transit with trial of upgraded solids and after swallow assessment 9/18. Noted in chart that pt was upset with restrictions on texture.She had dental extraction approximately 20 days ago and denies soreness. She was able to masticate regular solid bolus with the aid of water to moisten bolus and transit. No significant residue. Recommend Dys 2 (fine chopped), thin liquid and discussed with pt who agreed. She did not require cues with small amount she agreed to consume. ST will follow for upgrade when/if appropriate.   HPI HPI: 59 y.o. female.  PMH Morbid obesity.  CVA w R hemiparesis, aphasia, facial droop,  bed to wheelchair bound, sigmoid diverticulosis, 22 teeth extracted 3 days prior to admission, ventral hernias, uterine fibroids per CT wo contrast in 06/2018.  S/p cholecystectomy, complicated by surgical sponge left in place requiring laparotomy (at [facility] in [place]).       SLP Plan  Continue with current plan of care       Recommendations  Diet recommendations: Dysphagia 2 (fine chop);Thin liquid Liquids provided via: Cup;Straw Medication Administration: Whole meds with puree Supervision: Patient able to self feed Compensations: Slow rate;Small sips/bites;Lingual sweep for clearance of pocketing Postural Changes and/or Swallow Maneuvers: Seated upright 90 degrees                Oral Care Recommendations: Oral care BID Follow up Recommendations: Other (comment) (TBD) SLP Visit Diagnosis: Dysphagia, unspecified (R13.10) Plan: Continue with current plan of care                      Houston Siren 09/01/2020, 2:23 PM    Orbie Pyo Colvin Caroli.Ed Risk analyst [phone] Office [phone]

## 2020-09-02 ENCOUNTER — Inpatient Hospital Stay (HOSPITAL_COMMUNITY): Payer: Medicaid Other

## 2020-09-02 DIAGNOSIS — M6282 Rhabdomyolysis: Secondary | ICD-10-CM | POA: Diagnosis not present

## 2020-09-02 DIAGNOSIS — N179 Acute kidney failure, unspecified: Secondary | ICD-10-CM | POA: Diagnosis not present

## 2020-09-02 DIAGNOSIS — R079 Chest pain, unspecified: Secondary | ICD-10-CM

## 2020-09-02 DIAGNOSIS — R072 Precordial pain: Secondary | ICD-10-CM

## 2020-09-02 DIAGNOSIS — K2950 Unspecified chronic gastritis without bleeding: Secondary | ICD-10-CM | POA: Diagnosis not present

## 2020-09-02 DIAGNOSIS — K449 Diaphragmatic hernia without obstruction or gangrene: Secondary | ICD-10-CM | POA: Diagnosis not present

## 2020-09-02 DIAGNOSIS — R0602 Shortness of breath: Secondary | ICD-10-CM

## 2020-09-02 LAB — COMPREHENSIVE METABOLIC PANEL
ALT: 22 U/L (ref 0–44)
AST: 24 U/L (ref 15–41)
Albumin: 2 g/dL — ABNORMAL LOW (ref 3.5–5.0)
Alkaline Phosphatase: 74 U/L (ref 38–126)
Anion gap: 13 (ref 5–15)
BUN: 32 mg/dL — ABNORMAL HIGH (ref 6–20)
CO2: 14 mmol/L — ABNORMAL LOW (ref 22–32)
Calcium: 7.1 mg/dL — ABNORMAL LOW (ref 8.9–10.3)
Chloride: 110 mmol/L (ref 98–111)
Creatinine, Ser: 3.44 mg/dL — ABNORMAL HIGH (ref 0.44–1.00)
GFR calc Af Amer: 16 mL/min — ABNORMAL LOW (ref >60)
GFR calc non Af Amer: 14 mL/min — ABNORMAL LOW (ref >60)
Glucose, Bld: 109 mg/dL — ABNORMAL HIGH (ref 70–99)
Potassium: 4 mmol/L (ref 3.5–5.1)
Sodium: 137 mmol/L (ref 135–145)
Total Bilirubin: 0.8 mg/dL (ref 0.3–1.2)
Total Protein: 5.6 g/dL — ABNORMAL LOW (ref 6.5–8.1)

## 2020-09-02 LAB — BLOOD GAS, VENOUS
Acid-base deficit: 7.7 mmol/L — ABNORMAL HIGH (ref 0.0–2.0)
Bicarbonate: 16.4 mmol/L — ABNORMAL LOW (ref 20.0–28.0)
Drawn by: 2605
FIO2: 21
O2 Saturation: 72.4 %
Patient temperature: 36.8
pCO2, Ven: 28.2 mmHg — ABNORMAL LOW (ref 44.0–60.0)
pH, Ven: 7.382 (ref 7.250–7.430)
pO2, Ven: 38.4 mmHg (ref 32.0–45.0)

## 2020-09-02 LAB — CBC WITH DIFFERENTIAL/PLATELET
Abs Immature Granulocytes: 0.06 10*3/uL (ref 0.00–0.07)
Basophils Absolute: 0 10*3/uL (ref 0.0–0.1)
Basophils Relative: 0 %
Eosinophils Absolute: 0.6 10*3/uL — ABNORMAL HIGH (ref 0.0–0.5)
Eosinophils Relative: 6 %
HCT: 24.6 % — ABNORMAL LOW (ref 36.0–46.0)
Hemoglobin: 7.8 g/dL — ABNORMAL LOW (ref 12.0–15.0)
Immature Granulocytes: 1 %
Lymphocytes Relative: 13 %
Lymphs Abs: 1.4 10*3/uL (ref 0.7–4.0)
MCH: 29.3 pg (ref 26.0–34.0)
MCHC: 31.7 g/dL (ref 30.0–36.0)
MCV: 92.5 fL (ref 80.0–100.0)
Monocytes Absolute: 0.7 10*3/uL (ref 0.1–1.0)
Monocytes Relative: 6 %
Neutro Abs: 8.5 10*3/uL — ABNORMAL HIGH (ref 1.7–7.7)
Neutrophils Relative %: 74 %
Platelets: 270 10*3/uL (ref 150–400)
RBC: 2.66 MIL/uL — ABNORMAL LOW (ref 3.87–5.11)
RDW: 18 % — ABNORMAL HIGH (ref 11.5–15.5)
WBC: 11.3 10*3/uL — ABNORMAL HIGH (ref 4.0–10.5)
nRBC: 0 % (ref 0.0–0.2)

## 2020-09-02 LAB — GLUCOSE, CAPILLARY
Glucose-Capillary: 87 mg/dL (ref 70–99)
Glucose-Capillary: 89 mg/dL (ref 70–99)
Glucose-Capillary: 91 mg/dL (ref 70–99)
Glucose-Capillary: 99 mg/dL (ref 70–99)

## 2020-09-02 LAB — RENAL FUNCTION PANEL
Albumin: 2 g/dL — ABNORMAL LOW (ref 3.5–5.0)
Anion gap: 13 (ref 5–15)
BUN: 33 mg/dL — ABNORMAL HIGH (ref 6–20)
CO2: 15 mmol/L — ABNORMAL LOW (ref 22–32)
Calcium: 7 mg/dL — ABNORMAL LOW (ref 8.9–10.3)
Chloride: 109 mmol/L (ref 98–111)
Creatinine, Ser: 3.39 mg/dL — ABNORMAL HIGH (ref 0.44–1.00)
GFR calc Af Amer: 17 mL/min — ABNORMAL LOW (ref >60)
GFR calc non Af Amer: 14 mL/min — ABNORMAL LOW (ref >60)
Glucose, Bld: 105 mg/dL — ABNORMAL HIGH (ref 70–99)
Phosphorus: 2.9 mg/dL (ref 2.5–4.6)
Potassium: 4 mmol/L (ref 3.5–5.1)
Sodium: 137 mmol/L (ref 135–145)

## 2020-09-02 LAB — LACTIC ACID, PLASMA
Lactic Acid, Venous: 1.1 mmol/L (ref 0.5–1.9)
Lactic Acid, Venous: 1.3 mmol/L (ref 0.5–1.9)
Lactic Acid, Venous: 1.3 mmol/L (ref 0.5–1.9)

## 2020-09-02 LAB — ECHOCARDIOGRAM COMPLETE
Area-P 1/2: 7.44 cm2
Calc EF: 59.9 %
Height: 66.5 in
S' Lateral: 2 cm
Single Plane A2C EF: 52.8 %
Single Plane A4C EF: 62.7 %
Weight: 5280.11 oz

## 2020-09-02 LAB — TROPONIN I (HIGH SENSITIVITY)
Troponin I (High Sensitivity): 25 ng/L — ABNORMAL HIGH (ref <18)
Troponin I (High Sensitivity): 25 ng/L — ABNORMAL HIGH (ref <18)
Troponin I (High Sensitivity): 25 ng/L — ABNORMAL HIGH (ref <18)

## 2020-09-02 LAB — MAGNESIUM: Magnesium: 1.7 mg/dL (ref 1.7–2.4)

## 2020-09-02 LAB — GLUCOSE, RANDOM: Glucose, Bld: 108 mg/dL — ABNORMAL HIGH (ref 70–99)

## 2020-09-02 MED ORDER — PROMETHAZINE HCL 25 MG/ML IJ SOLN: 6.2500 mg | Freq: Once | INTRAMUSCULAR | Status: DC

## 2020-09-02 MED ORDER — ONDANSETRON HCL 4 MG PO TABS: 4.0000 mg | ORAL_TABLET | Freq: Four times a day (QID) | ORAL | Status: DC

## 2020-09-02 MED ORDER — MAGNESIUM SULFATE IN D5W 1-5 GM/100ML-% IV SOLN
1.0000 g | Freq: Once | INTRAVENOUS | Status: AC
  Administered 2020-09-02: 1 g via INTRAVENOUS
  Filled 2020-09-02: qty 100

## 2020-09-02 MED ORDER — LACTATED RINGERS IV BOLUS
500.0000 mL | Freq: Once | INTRAVENOUS | Status: AC
  Administered 2020-09-02: 500 mL via INTRAVENOUS

## 2020-09-02 MED ORDER — NITROGLYCERIN 0.4 MG SL SUBL
0.4000 mg | SUBLINGUAL_TABLET | SUBLINGUAL | Status: DC | PRN
  Administered 2020-09-02 – 2020-09-04 (×3): 0.4 mg via SUBLINGUAL
  Filled 2020-09-02: qty 1

## 2020-09-02 MED ORDER — NITROGLYCERIN 0.4 MG SL SUBL
SUBLINGUAL_TABLET | SUBLINGUAL | Status: AC
  Administered 2020-09-02: 0.4 mg
  Filled 2020-09-02: qty 1

## 2020-09-02 NOTE — Progress Notes (Signed)
  Echocardiogram 2D Echocardiogram has been performed.  Cristina Jordan 09/02/2020, 1:04 PM

## 2020-09-02 NOTE — Plan of Care (Signed)
  Problem: Nutrition: Goal: Adequate nutrition will be maintained Outcome: Not Progressing   Problem: Elimination: Goal: Will not experience complications related to bowel motility Outcome: Not Progressing   

## 2020-09-02 NOTE — Progress Notes (Signed)
Subjective:   Early this morning, Ms. Dziuba was sitting up comfortably in bed with improvement in her nausea, vomiting, abdominal pain, diarrhea, and appetite, attempting to eat some solid breakfast. However, when she was seen again at bedside later this morning, she had just been moved from her bed to a chair and complained of severe chest pain. She described the pain as a substernal pressure, worse with breathing, associated with shortness of breath. She also had some nausea at the time but no vomiting or abdominal pain. Nursing deny worsening diarrhea or vaginal bleeding or similar episodes of pain. Her pain improved s/p NTG x 2 and she continued to deny any abdominal pain.   Objective:  Vital signs in last 24 hours: Vitals:   09/02/20 1140 09/02/20 1200 09/02/20 1225 09/02/20 1409  BP: (!) 154/111 (!) 170/90 (!) 165/68 (!) 145/98  Pulse:  (!) 117 (!) 118 (!) 112  Resp: 16   19  Temp:    98.9 F (37.2 C)  TempSrc:    Oral  SpO2: 100%  100% 100%  Weight:      Height:       General: Patient is morbidly obese. She appears uncomfortable.  HENT: Mucus membranes are moist. Edentulous.  Respiratory: Anterior lung sounds decreased but CTA, bilaterally. Patient is tachypneic without increased work of breathing.  Cardiovascular: Rate is tachycardic. rhythm is regular. No murmurs, rubs, or gallops. Soft heart sounds. Pulses are 2+ in all four extremities.  Neurological: Patient has R-sided hemiparesis. She appears lethargic with 4/5 strength in left-sided extremities.  Abdominal: Soft without TTP, guarding, or rebound. No distention. Bowel sounds quiet. MSK: No chest wall TTP.   Genitourinary: Foley is in place, draining urine. Skin: Warm and dry. No rashes noted.  Assessment/Plan:  Active Problems:   Rhabdomyolysis   Hypokalemia   Hypocalcemia   Acute renal failure (HCC)   Malnutrition of moderate degree   Palliative care by specialist   DNR (do not resuscitate) discussion    Lethargy   DNR (do not resuscitate)   Weakness generalized   Vaginal bleeding   Colitis   Chronic gastritis without bleeding   Morbid obesity ([place])   Hiatal hernia   Non-intractable vomiting  # Chest Pain Patient experienced an episode of CP that began suddenly this morning shortly after she was moved from her bed to a chair. Pain was a substernal pressure, pleuritic in nature, associated with SOB and nausea, improved with NTG x 2. Patient was lethargic with worsening HTN. CBG 99. EKG showed Q waves in III, progressed since previous EKG with no T wave or ST changes concerning for ischemia. ECHO with signs of hyperdynamic activity and decreased LV filling, but without significant retrograde flow or signs of RV strain. Differential includes ACS, PE (especially in setting of possible gynecological malignancy without medical DVT PPx), esophageal tear/rupture in setting of recent vomiting (although less likely given unremarkable CXR). Consider other GI cause (ex. Perforated ulceration) although less likely given no abdominal pain. Patient was given LR 564mL bolus for continued tachycardia.  - Check troponins - Check VBG - Check Lactic Acid  - Check CMP  - NTG for pain  - Depending on above, will consider single RBC transfusion and V/Q scan  - Will avoid CTA chest for now given renal function  - Continue Scopolamine / Emetrol PRN for nausea  # Acute Colitis and Chronic Duodenitis/Gastritis EGD biopsies suggestive of chronic peptic duodenitis, chronic gastritis with reactive esophagitis, and flex sigmoidoscopy consistent  with likely acute, infectious, self-limited colitis. Patient's nausea continues without active vomiting, abdominal pain, or diarrhea today.  - Continue PPI PO BID for 4 weeks then drop to 1x daily for 8 weeks followed by EGD in 3 months to document healing, per GI. - Avoid NSAIDs - Attempted to transition many of her oral medications to liquids, although patient continues not to  take these regularly secondary to nausea - Continue to encourage fluid intake - B12 1000 mcg x 7 days  - Continue scopolamine patch and Emetrol PRN - Hold Imodium given GI recs   # Severe AKI on likely Chronic Renal Failure stage IIIb with Intermittent, Mild Metabolic Acidosis  Creatinine on arrival was 10.23 (up from 1.7 in 2019), BUN 73, GFR 4, all continuing to imrpove. Likely prerenal due to prolonged poor PO intake and diarrhea. Renal Ultrasound showed increased echogenicity consistent with medical renal disease. Patient appears euvolemic. Continues to have stable NAGMA. - Nephrology signed off given stability/improvement; however, patient is a poor dialysis candidate. PT and OT recommend SNF placement; patient has expressed desire to go home - Ordered TOC SNF placement consult; dispo will need to be discussed with family  - Increased LR to 139mL/hr given vomiting reported - Monitor Strict I&O - Will continue NaHCO3 1.3g BID if able to tolerate oral medication - Continue to monitor daily renal function panel   # Electrolyte Abnormalities with Moderate Malnutrition Patient has only been consuming broth for at least 2 weeks at home with N/V/D. Nutrition endorses 22% weight loss over past 3 months.  Potassium on arrival was less than 2, corrected calcium was 6.8, magnesium was 1.4 likely secondary to poor p.o. intake, nausea vomiting and diarrhea. Prolonged QTc. IJ tunnel cath placed 7/89/38 without complication. - Mg 1.7, gave 1g magnesium sulfate  - Other electrolytes stable; continue to replace as needed - Albumin low, encouraged PO intake as tolerated  # Stable Normocytic Anemia Likely due to vaginal bleeding and worsening CKD. Her nausea may be 2/2 undiagnosed gynecological malignancy. - OB-GYN consulted; patient will be scheduled to follow up OP for endometrial Bx at [facility] [Dr. Emeterio Reeve will message the office] if kidney function continues to improve - Patient has not  been receiving Megestrol 40mg  BID regularly; encourage regular medication dosing as tolerated - Will hold off on iron for now given GI inflammation though will likely require this vs. EPO in future - On SCDs for DVT PPx until bleeding resolves - consider transfusion if troponins elevated  # Hypertension  Blood pressure elevated up to 101'B systolic although equal in bilateral upper extremities today. Likely due to acute chest pain.  - Continue to monitor on amlodipine 10mg  daily  # Nasal Congestion, resolved  - Flonase PRN  Prior to Admission Living Arrangement: Home  Anticipated Discharge Location: SNF  Barriers to Discharge: Chest pain workup, decreased PO intake  Code Status: DNR/DNI Diet: Dysphagia 2 diet without fluid restriction IVF: LR 180mL/hr DVT PPx: SCD's given vaginal bleeding  Jeralyn Bennett, MD 09/02/2020, 3:15 PM Pager: [phone] After 5pm on weekdays and 1pm on weekends: On Call pager [phone]

## 2020-09-02 NOTE — Progress Notes (Signed)
Physical Therapy Treatment Patient Details Name: Cristina Jordan MRN: [id number] DOB: 1961-05-02 Today's Date: 09/02/2020    History of Present Illness 59 year old woman with h/o CVA, WC bound, presenting with worsening weakness.  She also has nausea, vomiting, decreased PO intake, abdominal pain, diarrhea X 1 week and weak to the point of not being able to get out of bed.  She was found to have colitis, Severe AKI, and electrolyte abnormalities.      PT Comments    Patient eager to get OOB so session focused on there ex in supine, bed mobility and transferring OOB to a bariatric chair using maximove with total A of 3. Pt with little initiation to assist with rolling despite cues. Able to participate in LLE exercises with some assist. Will continue to work on strengthening and OOB tolerance. Will follow.   Follow Up Recommendations  SNF;Supervision/Assistance - 24 hour     Equipment Recommendations  Other (comment) (if pt going home, will need hospital bed, lift)    Recommendations for Other Services       Precautions / Restrictions Precautions Precautions: Fall;Other (comment) Precaution Comments: baseline R-sided hemiplegia Restrictions Weight Bearing Restrictions: No Other Position/Activity Restrictions: flaccid RUE/RLE    Mobility  Bed Mobility Overal bed mobility: Needs Assistance Bed Mobility: Rolling Rolling: Total assist;+2 for physical assistance (plus 3)         General bed mobility comments: Rolling to right/left for pericare and to place maxi pad. No initiation despite cues to use LUE/LLE.  Transfers Overall transfer level: Needs assistance               General transfer comment: Used maximove to transfer pt to bariatric chair.  Ambulation/Gait                 Stairs             Wheelchair Mobility    Modified Rankin (Stroke Patients Only)       Balance                                            Cognition  Arousal/Alertness: Awake/alert Behavior During Therapy: Flat affect Overall Cognitive Status: No family/caregiver present to determine baseline cognitive functioning                                 General Comments: Follows simple commands; few verbalizations.      Exercises General Exercises - Lower Extremity Ankle Circles/Pumps: AROM;Left;10 reps;Supine Quad Sets: AROM;Left;10 reps;Supine Heel Slides: Left;10 reps;Supine;AAROM    General Comments        Pertinent Vitals/Pain Pain Assessment: Faces Faces Pain Scale: No hurt    Home Living                      Prior Function            PT Goals (current goals can now be found in the care plan section) Progress towards PT goals: Not progressing toward goals - comment    Frequency    Min 2X/week      PT Plan Current plan remains appropriate    Co-evaluation              AM-PAC PT "6 Clicks" Mobility   Outcome Measure  Help needed turning from  your back to your side while in a flat bed without using bedrails?: Total Help needed moving from lying on your back to sitting on the side of a flat bed without using bedrails?: Total Help needed moving to and from a bed to a chair (including a wheelchair)?: Total Help needed standing up from a chair using your arms (e.g., wheelchair or bedside chair)?: Total Help needed to walk in hospital room?: Total Help needed climbing 3-5 steps with a railing? : Total 6 Click Score: 6    End of Session   Activity Tolerance: Patient tolerated treatment well Patient left: in chair;with call bell/phone within reach;with nursing/sitter in room Nurse Communication: Mobility status PT Visit Diagnosis: Difficulty in walking, not elsewhere classified (R26.2);Muscle weakness (generalized) (M62.81)     Time: 2481-8590 PT Time Calculation (min) (ACUTE ONLY): 38 min  Charges:  $Therapeutic Exercise: 8-22 mins $Therapeutic Activity: 23-37 mins                      Marisa Severin, PT, DPT Acute Rehabilitation Services Pager [phone] Office [phone]       Marguarite Arbour A Sabra Heck 09/02/2020, 12:56 PM

## 2020-09-02 NOTE — Progress Notes (Signed)
   09/02/20 1200  Assess: MEWS Score  BP (!) 170/90  Pulse Rate (!) 117  Assess: MEWS Score  MEWS Temp 0  MEWS Systolic 0  MEWS Pulse 2  MEWS RR 0  MEWS LOC 0  MEWS Score 2  MEWS Score Color Yellow  Assess: if the MEWS score is Yellow or Red  Were vital signs taken at a resting state? Yes  Focused Assessment Change from prior assessment (see assessment flowsheet)  Early Detection of Sepsis Score *See Row Information* Low  MEWS guidelines implemented *See Row Information* Yes  Treat  Pain Scale 0-10  Pain Score 6  Pain Type Acute pain  Pain Location Chest  Pain Intervention(s) Medication (See eMAR)  Take Vital Signs  Increase Vital Sign Frequency  Yellow: Q 2hr X 2 then Q 4hr X 2, if remains yellow, continue Q 4hrs  Escalate  MEWS: Escalate Yellow: discuss with charge nurse/RN and consider discussing with provider and RRT  Notify: Charge Nurse/RN  Name of Charge Nurse/RN Notified Maryruth Hancock RN  Date Charge Nurse/RN Notified 09/02/20  Time Charge Nurse/RN Notified 1154  Notify: Provider  Provider Name/Title Raines  Date Provider Notified 09/02/20  Time Provider Notified 1154  Notification Type Rounds (Dr pressent in room)  Notification Reason Change in status  Response See new orders  Date of Provider Response 09/02/20  Time of Provider Response 1154  Document  Patient Outcome Other (Comment) (still waiting for labs)  Progress note created (see row info) Yes

## 2020-09-02 NOTE — Progress Notes (Signed)
Patient ID: Cristina Jordan, female   DOB: 21-Sep-1961, 59 y.o.   MRN: [id number]  This NP visited patient at the bedside as a follow up for palliative medicine  needs and emotional support.  Patient is alert and oriented, however she remains weak.  Spoke to daughter Cristina Jordan by phone  Education offered regarding current medical situation. Patient has multiple co-morbid ites which makes her high risk for decompensation.  Both  verbalize an understanding of her current medical situation and hope for improvement. Patient is open to all offered and available medical interventions to prolong life.   Questions and concerns addressed to the best of my ability.    Education offered regarding the importance of continued conversation with  family and the medical providers regarding overall plan of care and treatment options,  ensuring decisions are within the context of the patients values and GOCs.  Stress importance of advanced care planning/   MOST form introduced.    Discussed with attending team         PMT will continue to support holistically  Total time spent on the unit was 25 minutes  Greater than 50% of the time was spent in counseling and coordination of care  Wadie Lessen NP  Palliative Medicine Team Team Phone # [phone] Pager [phone]

## 2020-09-02 NOTE — Progress Notes (Signed)
° °         Daily Rounding Note  09/02/2020, 8:29 AM  LOS: 17 days   SUBJECTIVE:   Chief complaint: vomitng    No abd pain.  Feeling need to vomit but denies nausea.  Ate small amount of chopped meal for BF.  After that did not want to take po meds, even the liquid meds for fear of vomiting.  Same happened last PM.  No emesis observed by staff.  BM this AM.    OBJECTIVE:         Vital signs in last 24 hours:    Temp:  [98 F (36.7 C)-98.5 F (36.9 C)] 98 F (36.7 C) (09/20 2230) Pulse Rate:  [101-109] 101 (09/20 2230) Resp:  [18] 18 (09/20 2230) BP: (145-148)/(88-95) 148/90 (09/20 2230) SpO2:  [98 %-99 %] 98 % (09/20 2230) Last BM Date: 09/01/20 Filed Weights   08/27/20 0525 08/28/20 2225 08/29/20 2053  Weight: (!) 159.7 kg (!) 149.7 kg (!) 149.7 kg   General: chron ill lookiing.  obese   Heart: RRR Chest: no labored breathing Abdomen: soft, NT, ND.  BS active.    Extremities: no pitting edema Neuro/Psych:  R hemiparesis.    Intake/Output from previous day: 09/20 0701 - 09/21 0700 In: 1477.9 [P.O.:360; I.V.:1117.9] Out: 1500 [Urine:1400; Emesis/NG output:100]  Intake/Output this shift: No intake/output data recorded.  Lab Results: Recent Labs    09/01/20 1826  WBC 11.4*  HGB 7.6*  HCT 24.1*  PLT 251   BMET Recent Labs    08/31/20 0402 09/01/20 0437 09/02/20 0356  NA 137 137 137  K 3.8 4.0 4.0  CL 108 110 109  CO2 17* 18* 15*  GLUCOSE 103* 100* 105*  BUN 36* 34* 33*  CREATININE 3.85* 3.58* 3.39*  CALCIUM 6.8* 6.9* 7.0*   LFT Recent Labs    08/31/20 0402 09/01/20 0437 09/02/20 0356  ALBUMIN 1.8* 1.9* 2.0*    Studies/Results: No results found.   Scheduled Meds:  amLODipine  10 mg Oral Daily   Chlorhexidine Gluconate Cloth  6 each Topical Daily   dicyclomine  10 mg Oral TID AC   megestrol  40 mg Oral BID   ondansetron  4 mg Oral Q6H   pantoprazole  40 mg Oral BID   scopolamine  1  patch Transdermal Q72H   sodium bicarbonate  1,300 mg Oral BID   sodium chloride flush  10-40 mL Intracatheter Q12H   sodium chloride flush  3 mL Intravenous Q12H   Continuous Infusions:  sodium chloride 500 mL (08/31/20 2152)   lactated ringers 100 mL/hr at 09/02/20 0803   PRN Meds:.sodium chloride, acetaminophen, alum & mag hydroxide-simeth, anti-nausea, fluticasone, lidocaine-EPINEPHrine, phenol, sodium chloride flush   ASSESMENT:   *  Urge to vomit but little actual emesis. Not clear this is accompanied by nausea.    Presbyesophagus, astric erythema, duodenal ulcers/duodenitis per EGD Bid protonix, bentyl, scopalamine patch.    *   Hx CVA.  Not on [facility] or APT meds.     PLAN   *   Trial of scheduled Zofran for 2 d, see if it helps. Alternatively could trial low dose Reglan.    stopped Bentyl.  Consider stopping scopalamine.    Azucena Freed  09/02/2020, 8:29 AM Phone [phone]

## 2020-09-03 ENCOUNTER — Inpatient Hospital Stay (HOSPITAL_COMMUNITY): Payer: Medicaid Other

## 2020-09-03 DIAGNOSIS — M6282 Rhabdomyolysis: Secondary | ICD-10-CM | POA: Diagnosis not present

## 2020-09-03 DIAGNOSIS — R1111 Vomiting without nausea: Secondary | ICD-10-CM

## 2020-09-03 DIAGNOSIS — K2950 Unspecified chronic gastritis without bleeding: Secondary | ICD-10-CM | POA: Diagnosis not present

## 2020-09-03 DIAGNOSIS — N179 Acute kidney failure, unspecified: Secondary | ICD-10-CM | POA: Diagnosis not present

## 2020-09-03 DIAGNOSIS — K449 Diaphragmatic hernia without obstruction or gangrene: Secondary | ICD-10-CM | POA: Diagnosis not present

## 2020-09-03 LAB — MAGNESIUM: Magnesium: 1.7 mg/dL (ref 1.7–2.4)

## 2020-09-03 LAB — RENAL FUNCTION PANEL
Albumin: 1.9 g/dL — ABNORMAL LOW (ref 3.5–5.0)
Anion gap: 11 (ref 5–15)
BUN: 32 mg/dL — ABNORMAL HIGH (ref 6–20)
CO2: 16 mmol/L — ABNORMAL LOW (ref 22–32)
Calcium: 7.2 mg/dL — ABNORMAL LOW (ref 8.9–10.3)
Chloride: 111 mmol/L (ref 98–111)
Creatinine, Ser: 3.27 mg/dL — ABNORMAL HIGH (ref 0.44–1.00)
GFR calc Af Amer: 17 mL/min — ABNORMAL LOW (ref >60)
GFR calc non Af Amer: 15 mL/min — ABNORMAL LOW (ref >60)
Glucose, Bld: 99 mg/dL (ref 70–99)
Phosphorus: 2.7 mg/dL (ref 2.5–4.6)
Potassium: 4 mmol/L (ref 3.5–5.1)
Sodium: 138 mmol/L (ref 135–145)

## 2020-09-03 LAB — CBC
HCT: 22.7 % — ABNORMAL LOW (ref 36.0–46.0)
Hemoglobin: 7.1 g/dL — ABNORMAL LOW (ref 12.0–15.0)
MCH: 28.9 pg (ref 26.0–34.0)
MCHC: 31.3 g/dL (ref 30.0–36.0)
MCV: 92.3 fL (ref 80.0–100.0)
Platelets: 243 10*3/uL (ref 150–400)
RBC: 2.46 MIL/uL — ABNORMAL LOW (ref 3.87–5.11)
RDW: 18 % — ABNORMAL HIGH (ref 11.5–15.5)
WBC: 10.4 10*3/uL (ref 4.0–10.5)
nRBC: 0 % (ref 0.0–0.2)

## 2020-09-03 LAB — GLUCOSE, CAPILLARY
Glucose-Capillary: 83 mg/dL (ref 70–99)
Glucose-Capillary: 86 mg/dL (ref 70–99)
Glucose-Capillary: 91 mg/dL (ref 70–99)

## 2020-09-03 MED ORDER — SODIUM CHLORIDE 0.9 % IV SOLN
8.0000 mg | Freq: Once | INTRAVENOUS | Status: AC
  Administered 2020-09-03: 8 mg via INTRAVENOUS
  Filled 2020-09-03: qty 4

## 2020-09-03 MED ORDER — ONDANSETRON HCL 4 MG/2ML IJ SOLN
4.0000 mg | Freq: Three times a day (TID) | INTRAMUSCULAR | Status: AC
  Administered 2020-09-03 – 2020-09-04 (×2): 4 mg via INTRAVENOUS
  Filled 2020-09-03 (×2): qty 2

## 2020-09-03 MED ORDER — PANTOPRAZOLE SODIUM 40 MG IV SOLR
40.0000 mg | Freq: Two times a day (BID) | INTRAVENOUS | Status: DC
  Administered 2020-09-03 – 2020-09-17 (×27): 40 mg via INTRAVENOUS
  Filled 2020-09-03 (×28): qty 40

## 2020-09-03 NOTE — Progress Notes (Signed)
Daily Rounding Note  09/03/2020, 12:58 PM  LOS: 18 days   SUBJECTIVE:   Chief complaint:   Vomiting.  Episode of chest pain and decreased responsiveness yesterday morning, tachycardia to 110.  Pain relieved with SL nitroglycerin.  No acute EKG changes, chest x-ray negative.  Troponins stable at 25x3.  Pt again confirms no nausea.  She eats, food passes esophagus and when it gets to stomach she vomits.   Had BM's today.  No abd pain or chest pain.    OBJECTIVE:         Vital signs in last 24 hours:    Temp:  [98 F (36.7 C)-99.4 F (37.4 C)] 99.1 F (37.3 C) (09/22 1100) Pulse Rate:  [107-115] 107 (09/22 1100) Resp:  [16-22] 20 (09/22 1100) BP: (131-151)/(57-99) 131/99 (09/22 1100) SpO2:  [98 %-100 %] 100 % (09/22 0647) Weight:  [149.7 kg] 149.7 kg (09/21 2251) Last BM Date: 09/02/20 Filed Weights   08/28/20 2225 08/29/20 2053 09/02/20 2251  Weight: (!) 149.7 kg (!) 149.7 kg (!) 149.7 kg   Patient looks the same.  Comfortable Vague historian.   No labored breathing. RRR. Obese, nontender abdomen. Active BS   Intake/Output from previous day: 09/21 0701 - 09/22 0700 In: 3758.6 [P.O.:820; I.V.:2938.6] Out: 3375 [Urine:3375]  Intake/Output this shift: Total I/O In: 120 [P.O.:120] Out: 100 [Urine:100]  Lab Results: Recent Labs    09/01/20 1826 09/02/20 0855 09/03/20 0607  WBC 11.4* 11.3* 10.4  HGB 7.6* 7.8* 7.1*  HCT 24.1* 24.6* 22.7*  PLT 251 270 243   BMET Recent Labs    09/02/20 0356 09/02/20 1431 09/03/20 0430  NA 137 137 138  K 4.0 4.0 4.0  CL 109 110 111  CO2 15* 14* 16*  GLUCOSE 105* 108*  109* 99  BUN 33* 32* 32*  CREATININE 3.39* 3.44* 3.27*  CALCIUM 7.0* 7.1* 7.2*   LFT Recent Labs    09/02/20 0356 09/02/20 1431 09/03/20 0430  PROT  --  5.6*  --   ALBUMIN 2.0* 2.0* 1.9*  AST  --  24  --   ALT  --  22  --   ALKPHOS  --  74  --   BILITOT  --  0.8  --      Studies/Results: CT ABDOMEN PELVIS WO CONTRAST  Result Date: 09/02/2020 CLINICAL DATA:  Nausea, vomiting, diarrhea, vaginal bleeding EXAM: CT ABDOMEN AND PELVIS WITHOUT CONTRAST TECHNIQUE: Multidetector CT imaging of the abdomen and pelvis was performed following the standard protocol without IV contrast. COMPARISON:  08/24/2020 FINDINGS: Lower chest: Small left pleural effusion with minimal associated left basilar atelectasis. Ground-glass infiltrate within the right lower lobe is not well assessed on this examination due to motion artifact but may represent changes related to edema or infection. These are new since prior examination. Central venous catheter tip noted within the right atrium. Cardiac size within normal limits. Trace pericardial effusion is likely physiologic peer Hepatobiliary: Cholecystectomy has been performed. Moderate hepatic steatosis. No intra or extrahepatic biliary ductal dilation Pancreas: Unremarkable Spleen: Unremarkable Adrenals/Urinary Tract: The adrenal glands are unremarkable. The kidneys are normal in position. Moderate bilateral renal atrophy is noted, stable since prior examination. No hydronephrosis. No intrarenal or ureteral calculi. The bladder is decompressed with a Foley catheter balloon seen within its lumen. Stomach/Bowel: The stomach is unremarkable. The small bowel is unremarkable. The appendix is normal. There is severe descending and sigmoid colonic diverticulosis. Superimposed, there is circumferential bowel  wall thickening involving the descending and sigmoid colon, similar to that noted on prior examination, in keeping with changes of mild long segment colitis. In this region, ischemic colitis should be considered, though infectious or inflammatory etiologies could result in such an appearance. Pericolonic inflammatory changes appear improved since prior examination. There is no evidence of obstruction or perforation. Mild free fluid has developed within the  pelvis since prior examination. No free intraperitoneal gas. No loculated intra-abdominal fluid collections. Complex ventral hernia containing 2 separate knuckles of unremarkable transverse colon is again identified. Additional fat containing small umbilical hernia also noted. Vascular/Lymphatic: The abdominal vasculature is age-appropriate with mild aortoiliac atherosclerotic calcification. No aneurysm. No pathologic adenopathy. Reproductive: Fibroid uterus. The endometrium is thickened, best noted on sagittal image # 78/5, abnormal in a patient of this age. No adnexal masses. Other: Rectum is unremarkable. Diffuse body wall edema has developed. Musculoskeletal: No lytic or blastic bone lesions are seen. IMPRESSION: Subtle ground-glass pulmonary infiltrate within the visualized right lower lobe, new since prior examination, suspicious for acute infection or edema. Persistent, though improving, long segment inflammatory change involving the descending and sigmoid colon. Among the differential considerations, ischemic colitis should be considered. Superimposed severe diverticulosis without superimposed diverticulitis. Abnormal thickening of the endometrium. Dedicated transvaginal sonography should be performed once the patient's acute issues have resolved. Endometrial sampling may be helpful for further evaluation. Development of mild ascites, diffuse body wall edema, and small left pleural effusion in keeping with changes of a progressive anasarca. Aortic Atherosclerosis (ICD10-I70.0). Electronically Signed   By: Fidela Salisbury MD   On: 09/02/2020 22:41   DG CHEST PORT 1 VIEW  Result Date: 09/02/2020 CLINICAL DATA:  Chest pain.  COVID positive. EXAM: PORTABLE CHEST 1 VIEW COMPARISON:  08/29/2020. FINDINGS: Central line noted with tip at cavoatrial junction. Cardiomegaly with mild pulmonary venous congestion. Low lung volumes. Left base infiltrate/edema. Small left pleural effusion. No pneumothorax. IMPRESSION:  1.  Central line noted with tip at cavoatrial junction. 2.  Cardiomegaly with mild pulmonary venous congestion. 3. Low lung volumes. Left base infiltrate/edema. Small left pleural effusion. Electronically Signed   By: Marcello Moores  Register   On: 09/02/2020 12:26   ECHOCARDIOGRAM COMPLETE  Result Date: 09/02/2020 IMPRESSIONS  1. Left ventricular ejection fraction, by estimation, is >75%. The left ventricle has hyperdynamic function. The left ventricle has no regional wall motion abnormalities. There is moderate concentric left ventricular hypertrophy. Indeterminate diastolic  filling due to E-A fusion.  2. Right ventricular systolic function is normal. The right ventricular size is normal. Tricuspid regurgitation signal is inadequate for assessing PA pressure.  3. The mitral valve is normal in structure. No evidence of mitral valve regurgitation.  4. The aortic valve is grossly normal. Aortic valve regurgitation is not visualized. Mild aortic valve sclerosis is present, with no evidence of aortic valve stenosis.  5. The inferior vena cava is normal in size with greater than 50% respiratory variability, suggesting right atrial pressure of 3 mmHg.  Electronically signed by Sanda Klein MD Signature Date/Time: 09/02/2020/2:45:44 PM    Final    Scheduled Meds: . amLODipine  10 mg Oral Daily  . Chlorhexidine Gluconate Cloth  6 each Topical Daily  . megestrol  40 mg Oral BID  . pantoprazole  40 mg Oral BID  . scopolamine  1 patch Transdermal Q72H  . sodium bicarbonate  1,300 mg Oral BID  . sodium chloride flush  10-40 mL Intracatheter Q12H  . sodium chloride flush  3 mL Intravenous Q12H  Continuous Infusions: . sodium chloride Stopped (09/03/20 0634)  . lactated ringers 100 mL/hr at 09/03/20 0517   PRN Meds:.sodium chloride, acetaminophen, alum & mag hydroxide-simeth, anti-nausea, fluticasone, lidocaine-EPINEPHrine, nitroGLYCERIN, phenol, sodium chloride flush  ASSESMENT:   *  Vomiting, not clear she  has nausea. 09/09/2020 VOH:KGOVPC esophagus tortuous, path: reactive changes. 4 cm HH. Gastric body and antral erythema. Nonbleeding, clean-based duodenal ulcers. Biopsies of stomach and duodenum:  Peptic duodenitis, chronic gastritis,  Protonix 40 mg po bid, scopalamine patchin place.  09/06/2020 flexible sigmoidoscopy.Stool throughout examined colon despite tapwater enema preop. After lavage the mucosa appeared normal, biopsied obtained. No gross evidence for IBD. Rectosigmoid, sigmoid diverticulosis. Nonbleeding, nonthrombosed external and internal hemorrhoids. Suspect the colitis seen on initial CT may have resulted from ischemia. Path: edema, reactive changes and increased neutrophils within lamina propria. Findings s/o of acute self-limited (infectious) colitis. Negative for features of chronicity, granulomas or features of microscopic colitis.   Stool c diff and PCR path panel negative.   09/02/2020 CTAP without contrast shows new groundglass pulmonary infiltrates.  Persistent inflammatory changes in the descending and sigmoid colon.  Thickened endometrium.  New mild ascites, diffuse body wall edema, small left pleural effusion in keeping with progressive anasarca.  *    Acute, self-limited chest pain yesterday.  Echocardiogram reassuring.  Stable mild elevation of troponins and no acute EKG changes.  *   Hx CVA.  Not on [facility] or APT meds.    No acute changes on CT head of 9/4.  *     Advanced CKD.  *   Thickened endometrium.  Postmenopausal bleeding.  Megace started by gynecologist.   PLAN   *   Not clear where we go from here to manage this patient's vomiting.   Can we add trial of Reglan?  *   ? UGI series or nuc med GES?      Azucena Freed  09/03/2020, 12:58 PM Phone [phone]

## 2020-09-03 NOTE — TOC Progression Note (Addendum)
Transition of Care [facility]) - Progression Note    Patient Details  Name: Cristina Jordan MRN: [id number] Date of Birth: 1961-05-09  Transition of Care [facility]) CM/SW Contact  Bartholomew Crews, RN Phone Number: [phone] 09/03/2020, 4:55 PM  Clinical Narrative:     Patient not in room when NCM went to speak with her. Noted previous consent for TOC to speak with her daughter. Spoke with her daughter, [name], on the phone. Discussed patient preference to return home. [name] is agreeable to this if patient is able to get some therapy services.   Discussed that NCM will reach out to [facility] for authorization for [facility] services and therapy. Prior auth form completed and faxed to [facility] - pending follow call from [facility].   Patient has most needed DME already in the home. Patient had a stroke in 2017. The hospital in [place] made arrangements for hospital bed and hoyer lift. Hospital bed has since been replaced with a motorized bed purchased at [place] [facility]. The hoyer lift needs to be replaced. Patient has a bedside commode, RW, and gait belt.   Patient does not have a PCP. Discussed how to go online to [facility] and create patient account to find out what provider was assigned to patient for PCP.   Daughter has also already initiated process for [facility], but has not had any follow up. Previously they have used regular bus.   Daughter is interested in outpatient palliative care, but would like to discuss with patient further.   Spoke with patient at the bedside to discuss transition plans this evening. Patient in agreement with need for PCS worker and [facility] services for therapy. Patient advised that NCM will follow up with [facility]. Patient also stated that her wheelchair is too small. Discussed that wheelchair can be replaced but that it may not be covered by her Medicaid. Patient verbalized understanding.   TOC following for transition needs.   Expected Discharge Plan: [place] Barriers to Discharge: Continued Medical Work up  Expected Discharge Plan and Services Expected Discharge Plan: [place] arrangements for the past 2 months: Single Family Home                                       Social Determinants of Health (SDOH) Interventions    Readmission Risk Interventions No flowsheet data found.

## 2020-09-03 NOTE — Progress Notes (Signed)
Subjective:   Today, Ms. Stiver endorses throat pain. She attributes her throat pain to continued vomiting. She denies nausea, and states that she only vomits after attempting to eat or drink something. She states she hasn't had any abdominal pain for a few days now. Nursing report decreased vaginal bleeding, but patient states her diarrhea has remained constant. She also endorses pain at her foley insertion site. She denies any CP, SOB, palpitations or other symptoms currently.  Objective:  Vital signs in last 24 hours: Vitals:   09/02/20 2251 09/03/20 0308 09/03/20 0647 09/03/20 1100  BP: (!) 149/80 133/84 (!) 138/94 (!) 131/99  Pulse: (!) 112 (!) 111 (!) 115 (!) 107  Resp: 19 (!) 22 20 20   Temp: 98.6 F (37 C) 98.8 F (37.1 C) 99 F (37.2 C) 99.1 F (37.3 C)  TempSrc:    Oral  SpO2: 100% 98% 100%   Weight: (!) 149.7 kg     Height:       General: Patient is morbidly obese. She appears uncomfortable and tired. HENT: Mucus membranes are moist. Edentulous.  Respiratory: Anterior lung sounds decreased but CTA, bilaterally. No tachypnea. Cardiovascular: Rate is tachycardic. rhythm is regular. No murmurs, rubs, or gallops. Soft heart sounds. Pulses are 2+ in all four extremities.  Neurological: Patient has R-sided hemiparesis. She is alert and oriented. Abdominal: Soft without TTP, guarding, or rebound. Palpable midline hernias are reproducible without tenderness. No distention. Bowel sounds quiet. Genitourinary: Foley is in place, draining urine. Skin: Warm and dry. No rashes noted.  Assessment/Plan:  Active Problems:   Rhabdomyolysis   Hypokalemia   Hypocalcemia   Acute renal failure (HCC)   Malnutrition of moderate degree   Palliative care by specialist   DNR (do not resuscitate) discussion   Lethargy   DNR (do not resuscitate)   Weakness generalized   Vaginal bleeding   Colitis   Chronic gastritis without bleeding   Morbid obesity ([place])   Hiatal hernia    Non-intractable vomiting  # Chest Pain, Resolved Patient endorsed typical CP yesterday that improved with NTG, although troponins only mildly elevated and flat, lactic acid and CBG within normal limits. ECHO without WMA. May be due to acute RH strain given Q waves in lead III with high risk of PE, although V/Q scan unable to be obtained. Reassuring that symptoms have resolved today.  - Continue to monitor  - QTc improved this morning; consider additional anti-emetics  # Acute Colitis and Chronic Duodenitis/Gastritis EGD biopsies suggestive of chronic peptic duodenitis, chronic gastritis with reactive esophagitis, and flex sigmoidoscopy consistent with likely acute, infectious, self-limited colitis. Repeat CT abdomen / pelvis 09/02/20 showed improved colitis that may be ischemic in nature with gastric inflammation. Patient's vomiting and diarrhea continue, although patient denies nausea or abdominal pain. Consider delayed gastric emptying vs. Esophageal stricture. - Continue PPI IV BID for 4 weeks then drop to 1x daily for 8 weeks followed by EGD in 3 months to document healing, per GI. - Avoid NSAIDs - Attempted to transition many of her oral medications to liquids, although patient continues not to take these regularly secondary to nausea - Continue throat spray for sore throat for now - Added single dose Zofran  - Continue to encourage fluid intake - Hold Imodium given GI recs  - Patient may benefit from metoclopramide if QTc allows  - Consider repeat EGD; appreciate GI recommendations   # Severe AKI on likely Chronic Renal Failure stage IIIb with Intermittent, Mild Metabolic Acidosis  Creatinine on arrival was 10.23 (up from 1.7 in 2019), BUN 73, GFR 4, all continuing to imrpove. Likely prerenal due to prolonged poor PO intake and diarrhea. Renal Ultrasound showed increased echogenicity consistent with medical renal disease. Patient appears euvolemic. Continues to have stable NAGMA. -  Nephrology signed off given stability/improvement; however, patient is a poor dialysis candidate. PT and OT recommend SNF placement; patient has expressed desire to go home - Ordered TOC SNF placement consult; dispo will need to be discussed with family  - Continue LR 112mL/hr given vomiting reported - Monitor Strict I&O - Will continue NaHCO3 1.3g BID if able to tolerate oral medication - Continue to monitor daily renal function panel   # Electrolyte Abnormalities with Moderate Malnutrition Patient has only been consuming broth for at least 2 weeks at home with N/V/D. Nutrition endorses 22% weight loss over past 3 months.  Potassium on arrival was less than 2, corrected calcium was 6.8, magnesium was 1.4 likely secondary to poor p.o. intake, nausea vomiting and diarrhea. Prolonged QTc. IJ tunnel cath placed 03/26/23 without complication. - Electrolytes stable - Albumin low, encouraged PO intake as tolerated  # Stable Normocytic Anemia Likely due to vaginal bleeding and worsening CKD. Her nausea may be 2/2 undiagnosed gynecological malignancy. hgb dropped to 7.1 today, though no worsening bleeding reported. - OB-GYN consulted; patient will be scheduled to follow up OP for endometrial Bx at [place] [Dr. Emeterio Reeve will message the office] if kidney function continues to improve - Patient has not been receiving Megestrol 40mg  BID regularly; encourage regular medication dosing as tolerated - Will hold off on iron for now given GI inflammation though will likely require this vs. EPO in future - On SCDs for DVT PPx until bleeding resolves - Transfuse if Hgb < 7  # Hypertension  Severe hypertension yesterday has significantly improved; likely 2/2 chest pain episode - Continue to monitor on amlodipine 10mg  daily  Prior to Admission Living Arrangement: Home  Anticipated Discharge Location: SNF, although patient expressed interest in going home; palliative on board Barriers to Discharge:  Decreased PO intake; Vomiting and Diarrhea  Code Status: DNR/DNI Diet: Dysphagia 2 diet without fluid restriction IVF: LR 174mL/hr DVT PPx: SCD's given vaginal bleeding  Jeralyn Bennett, MD 09/03/2020, 4:43 PM Pager: [phone] After 5pm on weekdays and 1pm on weekends: On Call pager [phone]

## 2020-09-03 NOTE — Plan of Care (Signed)
  Problem: Activity: Goal: Risk for activity intolerance will decrease Outcome: Not Progressing   Problem: Nutrition: Goal: Adequate nutrition will be maintained Outcome: Not Progressing   

## 2020-09-03 NOTE — Progress Notes (Signed)
°  Speech Language Pathology Treatment: Dysphagia  Patient Details Name: Cristina Jordan MRN: [id number] DOB: 26-Jun-1961 Today's Date: 09/03/2020 Time: 1445-1500 SLP Time Calculation (min) (ACUTE ONLY): 15 min  Assessment / Plan / Recommendation Clinical Impression  Patient seen to address dysphagia goals with trials of regular solids (graham crackers). Patient denies any pain or soreness of gums from recent (9/1) extraction of 22 of her teeth. She asked SLP where she could get dentures. Patient stated that she has had trouble finding a doctor since moving last year from [place] to [place] but that her daughter has been helping her. Patient able to masticate graham crackers but with observed difficulty and needing extra time to do so. When asked how chewing the cracker was, she replied, "It was alright". She requested to not make any changes to her diet consistency right now and declined to upgrade to Dys 3 mechanical soft solids at this time. SLP will check back with patient regarding plans for diet consistency upgrade or maintain and likely will s/o after next visit.    HPI HPI: 59 y.o. female.  PMH Morbid obesity.  CVA w R hemiparesis, aphasia, facial droop,  bed to wheelchair bound, sigmoid diverticulosis, 22 teeth extracted 3 days prior to admission, ventral hernias, uterine fibroids per CT wo contrast in 06/2018.  S/p cholecystectomy, complicated by surgical sponge left in place requiring laparotomy (at [facility] in [place]).       SLP Plan  Continue with current plan of care       Recommendations  Diet recommendations: Dysphagia 2 (fine chop);Thin liquid Liquids provided via: Straw Medication Administration: Whole meds with puree Supervision: Patient able to self feed Compensations: Slow rate;Small sips/bites;Lingual sweep for clearance of pocketing Postural Changes and/or Swallow Maneuvers: Seated upright 90 degrees                Oral Care Recommendations: Oral  care BID Follow up Recommendations: None SLP Visit Diagnosis: Dysphagia, unspecified (R13.10) Plan: Continue with current plan of care       GO               Sonia Baller, MA, [place] [facility] Pager: [phone]

## 2020-09-04 ENCOUNTER — Inpatient Hospital Stay (HOSPITAL_COMMUNITY): Payer: Medicaid Other

## 2020-09-04 DIAGNOSIS — M6282 Rhabdomyolysis: Secondary | ICD-10-CM | POA: Diagnosis not present

## 2020-09-04 DIAGNOSIS — D500 Iron deficiency anemia secondary to blood loss (chronic): Secondary | ICD-10-CM

## 2020-09-04 DIAGNOSIS — N179 Acute kidney failure, unspecified: Secondary | ICD-10-CM | POA: Diagnosis not present

## 2020-09-04 DIAGNOSIS — D509 Iron deficiency anemia, unspecified: Secondary | ICD-10-CM

## 2020-09-04 DIAGNOSIS — R1110 Vomiting, unspecified: Secondary | ICD-10-CM

## 2020-09-04 DIAGNOSIS — K449 Diaphragmatic hernia without obstruction or gangrene: Secondary | ICD-10-CM | POA: Diagnosis not present

## 2020-09-04 DIAGNOSIS — K224 Dyskinesia of esophagus: Secondary | ICD-10-CM

## 2020-09-04 DIAGNOSIS — K2950 Unspecified chronic gastritis without bleeding: Secondary | ICD-10-CM | POA: Diagnosis not present

## 2020-09-04 LAB — CBC
HCT: 21.5 % — ABNORMAL LOW (ref 36.0–46.0)
Hemoglobin: 7 g/dL — ABNORMAL LOW (ref 12.0–15.0)
MCH: 30.3 pg (ref 26.0–34.0)
MCHC: 32.6 g/dL (ref 30.0–36.0)
MCV: 93.1 fL (ref 80.0–100.0)
Platelets: 275 10*3/uL (ref 150–400)
RBC: 2.31 MIL/uL — ABNORMAL LOW (ref 3.87–5.11)
RDW: 18.1 % — ABNORMAL HIGH (ref 11.5–15.5)
WBC: 10.8 10*3/uL — ABNORMAL HIGH (ref 4.0–10.5)
nRBC: 0 % (ref 0.0–0.2)

## 2020-09-04 LAB — GLUCOSE, CAPILLARY
Glucose-Capillary: 86 mg/dL (ref 70–99)
Glucose-Capillary: 87 mg/dL (ref 70–99)
Glucose-Capillary: 92 mg/dL (ref 70–99)
Glucose-Capillary: 92 mg/dL (ref 70–99)
Glucose-Capillary: 93 mg/dL (ref 70–99)

## 2020-09-04 LAB — RENAL FUNCTION PANEL
Albumin: 1.8 g/dL — ABNORMAL LOW (ref 3.5–5.0)
Anion gap: 10 (ref 5–15)
BUN: 30 mg/dL — ABNORMAL HIGH (ref 6–20)
CO2: 16 mmol/L — ABNORMAL LOW (ref 22–32)
Calcium: 7 mg/dL — ABNORMAL LOW (ref 8.9–10.3)
Chloride: 114 mmol/L — ABNORMAL HIGH (ref 98–111)
Creatinine, Ser: 3.03 mg/dL — ABNORMAL HIGH (ref 0.44–1.00)
GFR calc Af Amer: 19 mL/min — ABNORMAL LOW (ref >60)
GFR calc non Af Amer: 16 mL/min — ABNORMAL LOW (ref >60)
Glucose, Bld: 97 mg/dL (ref 70–99)
Phosphorus: 3 mg/dL (ref 2.5–4.6)
Potassium: 4.3 mmol/L (ref 3.5–5.1)
Sodium: 140 mmol/L (ref 135–145)

## 2020-09-04 LAB — MAGNESIUM: Magnesium: 1.6 mg/dL — ABNORMAL LOW (ref 1.7–2.4)

## 2020-09-04 MED ORDER — DILTIAZEM HCL 60 MG PO TABS: 60.0000 mg | ORAL_TABLET | Freq: Four times a day (QID) | ORAL | Status: DC

## 2020-09-04 MED ORDER — ORAL CARE MOUTH RINSE
15.0000 mL | Freq: Two times a day (BID) | OROMUCOSAL | Status: DC
  Administered 2020-09-04 – 2020-09-11 (×8): 15 mL via OROMUCOSAL

## 2020-09-04 MED ORDER — DILTIAZEM HCL 60 MG PO TABS
60.0000 mg | ORAL_TABLET | Freq: Four times a day (QID) | ORAL | Status: DC
  Administered 2020-09-04 – 2020-09-05 (×3): 60 mg via ORAL
  Filled 2020-09-04 (×4): qty 1

## 2020-09-04 MED ORDER — SUCRALFATE 1 GM/10ML PO SUSP
1.0000 g | Freq: Three times a day (TID) | ORAL | Status: DC
  Administered 2020-09-07 – 2020-09-10 (×4): 1 g via ORAL
  Filled 2020-09-04 (×15): qty 10

## 2020-09-04 MED ORDER — MAGNESIUM SULFATE 2 GM/50ML IV SOLN
2.0000 g | Freq: Once | INTRAVENOUS | Status: AC
  Administered 2020-09-04: 2 g via INTRAVENOUS
  Filled 2020-09-04: qty 50

## 2020-09-04 NOTE — Plan of Care (Signed)
  Problem: Nutrition: Goal: Adequate nutrition will be maintained Outcome: Not Progressing   

## 2020-09-04 NOTE — Progress Notes (Signed)
Patient c/o chest pain 7/10,pointing to epigastric area.VSS.Nitro SL given as previously ordered. Text paged [place],MD. Order received to do EKG. Will continue to monitor.

## 2020-09-04 NOTE — TOC Progression Note (Signed)
Transition of Care [facility]) - Progression Note    Patient Details  Name: Cristina Jordan MRN: [id number] Date of Birth: February 05, 1961  Transition [facility] Care [facility]) CM/SW Contact  Bartholomew Crews, RN Phone Number: [phone] 09/04/2020, 3:08 PM  Clinical Narrative:     Telephone call to [facility] this morning to follow up with faxed information from yesterday. Advised to refax information to [phone] and to mark cover sheet as urgent.   Spoke with Elberta Fortis at [facility] this afternoon to follow up with faxed information. Advised that urgent and priority requests can take 48-72 hours.   TOC following for transition needs.   Expected Discharge Plan: [place] Barriers to Discharge: Continued Medical Work up  Expected Discharge Plan and Services Expected Discharge Plan: [place] arrangements for the past 2 months: Single Family Home                                       Social Determinants of Health (SDOH) Interventions    Readmission Risk Interventions No flowsheet data found.

## 2020-09-04 NOTE — Progress Notes (Signed)
C/o epigastric burning.   No regurgitation yet today.  Ordering Ba esophagram to assess for dysmotility  Azucena Freed PA-C

## 2020-09-04 NOTE — Progress Notes (Signed)
Subjective:   Patient reports positional back pain from sitting in the chair and wants to get back in bed. Complains also of pain to the bottom of her R foot, at first was worse when not on footstand and now worse when placed back on it. Denies any leg pain. She states that her throat pain improved after using her throat spray and says she is able to swallow okay. She denies nausea but continues to gag / experience intermittent vomiting. Her diarrhea continues but she denies abdominal pain. CP from last night has resolved and she denies SOB.   Objective:  Vital signs in last 24 hours: Vitals:   09/04/20 0115 09/04/20 0201 09/04/20 0631 09/04/20 0813  BP: (!) 155/67 (!) 138/49 (!) 146/90 (!) 148/92  Pulse: (!) 119 (!) 108 (!) 112 (!) 112  Resp: 20 18 18 19   Temp: 97.7 F (36.5 C) 98.9 F (37.2 C) 98.7 F (37.1 C) 99.5 F (37.5 C)  TempSrc: Oral Oral Oral Oral  SpO2: 100% 100% 100% 100%  Weight:      Height:       General: Patient is morbidly obese. She appears uncomfortable and tired. HENT: Mucus membranes are moist. Edentulous.  Respiratory: Anterior lung sounds decreased but CTA, bilaterally. No tachypnea. Cardiovascular: Rate is tachycardic. rhythm is regular. No murmurs, rubs, or gallops. Soft heart sounds.  Neurological: Patient has R-sided hemiparesis. She is alert and oriented. Abdominal: Soft without TTP, guarding, or rebound. No distention. Bowel sounds quiet. MSK: RLE not TTP.  Genitourinary: Foley is in place, draining urine. Skin: Warm and dry. No rashes noted.  Assessment/Plan:  Active Problems:   Rhabdomyolysis   Hypokalemia   Hypocalcemia   Acute renal failure (HCC)   Malnutrition of moderate degree   Palliative care by specialist   DNR (do not resuscitate) discussion   Lethargy   DNR (do not resuscitate)   Weakness generalized   Vaginal bleeding   Colitis   Chronic gastritis without bleeding   Morbid obesity ([place])   Hiatal hernia   Non-intractable  vomiting   Regurgitation of food   Iron deficiency anemia due to chronic blood loss  # Chest Pain, Resolved Patient endorsed typical CP 09/02/20 that improved with NTG, although troponins only mildly elevated and flat, lactic acid and CBG within normal limits. ECHO without WMA. May be due to acute RH strain given Q waves in lead III with high risk of PE, although V/Q scan unable to be obtained. Though this pain resolved, she developed atypical, burning epigastric pain thought more likely GI in nature overnight. - Unable to obtain V/Q scan - QTc improved this morning; consider additional anti-emetics - Will check Barium esophagram   # Acute Colitis and Chronic Duodenitis/Gastritis EGD biopsies suggestive of chronic peptic duodenitis, chronic gastritis with reactive esophagitis, and flex sigmoidoscopy consistent with likely acute, infectious, self-limited colitis. Repeat CT abdomen / pelvis 09/02/20 showed improved colitis that may be ischemic in nature with gastric inflammation. Patient's vomiting and diarrhea continue, although patient denies nausea or abdominal pain. Consider delayed gastric emptying vs. Esophageal stricture. - Continue PPI IV BID for 4 weeks then drop to 1x daily for 8 weeks followed by EGD in 3 months to document healing, per GI. - Avoid NSAIDs - Attempted to transition many of her oral medications to liquids, although patient continues not to take these regularly secondary to vomiting - Continue throat spray for sore throat for now - continue Sucralfate and scopolamine patch - Continue to encourage  fluid intake - Hold Imodium given GI recs  - Patient may benefit from metoclopramide if esophagram normal and QTc allows - Will check barium esophogram  - Monitor EKGs  # Severe AKI on likely Chronic Renal Failure stage IIIb with Intermittent, Mild Metabolic Acidosis  Creatinine on arrival was 10.23 (up from 1.7 in 2019), BUN 73, GFR 4, all continuing to imrpove. Likely prerenal  due to prolonged poor PO intake and diarrhea. Renal Ultrasound showed increased echogenicity consistent with medical renal disease. Patient appears euvolemic. Continues to have stable NAGMA. - Nephrology signed off given stability/improvement; however, patient is a poor dialysis candidate. PT and OT recommend SNF placement; patient has expressed desire to go home - Ordered TOC SNF placement consult; dispo will need to be discussed with family  - Decreased LR to 23mL/hr - Monitor Strict I&O - Continue to monitor daily renal function panel   # Electrolyte Abnormalities with Moderate Malnutrition Patient has only been consuming broth for at least 2 weeks at home with N/V/D. Nutrition endorses 22% weight loss over past 3 months.  Potassium on arrival was less than 2, corrected calcium was 6.8, magnesium was 1.4 likely secondary to poor p.o. intake, nausea vomiting and diarrhea. Prolonged QTc. IJ tunnel cath placed 3/97/67 without complication. - Mg 1.6, given 2g magnesium sulfate - Other electrolytes stable  - Albumin low, encouraged PO intake as tolerated  # Stable Normocytic Anemia Likely due to vaginal bleeding and worsening CKD. Her nausea may be 2/2 undiagnosed gynecological malignancy. Hgb 7.0 today but with no active bleeding reported. - OB-GYN consulted; patient will be scheduled to follow up OP for endometrial Bx at [facility] [Dr. Emeterio Reeve will message the office] if kidney function continues to improve - Patient has not been receiving Megestrol 40mg  BID regularly; encourage regular medication dosing as tolerated - Will hold off on iron for now given GI inflammation though will likely require this vs. EPO in future - On SCDs for DVT PPx until bleeding resolves - Transfuse if Hgb < 7  # Hypertension  Severe hypertension has significantly improved; likely 2/2 chest pain episode - Continue to monitor on amlodipine 10mg  daily  Prior to Admission Living Arrangement: Home   Anticipated Discharge Location: SNF, although patient expressed interest in going home; palliative on board Barriers to Discharge: Decreased PO intake; Vomiting and Diarrhea  Code Status: DNR/DNI Diet: Dysphagia 2 diet without fluid restriction IVF: LR 7mL/hr DVT PPx: SCD's given vaginal bleeding  Jeralyn Bennett, MD 09/04/2020, 3:38 PM Pager: [phone] After 5pm on weekdays and 1pm on weekends: On Call pager [phone]

## 2020-09-04 NOTE — Progress Notes (Signed)
  Speech Language Pathology  Patient Details Name: Cristina Jordan MRN: [id number] DOB: 1961/02/01 Today's Date: 09/04/2020 Time:  -       Past 2 sessions pt has not wanted to upgrade from Dys 2 and appears pleased with this texture (fine chopped). ST will discharge from [facility] services. Please reconsult if needed.    Houston Siren 09/04/2020, 4:15 PM  Orbie Pyo Colvin Caroli.Ed Risk analyst [phone] Office [phone]

## 2020-09-04 NOTE — Progress Notes (Signed)
Physical Therapy Treatment Patient Details Name: Cristina Jordan MRN: [id number] DOB: 1961-09-11 Today's Date: 09/04/2020    History of Present Illness 59 year old woman with h/o CVA, WC bound, presenting with worsening weakness.  She also has nausea, vomiting, decreased PO intake, abdominal pain, diarrhea X 1 week and weak to the point of not being able to get out of bed.  She was found to have colitis, Severe AKI, and electrolyte abnormalities.      PT Comments    Patient received in bed, reports she needs to be cleaned, therefore nursing cleaning/rolling patient prior to our assistance for transfer. She does not assist with mobility at all when moving in bed to place lift pad on maximove. Requires total mechanical assist for transfer bed to recliner and for positioning in recliner. She will continue to benefit from skilled PT to assist with transfers to prevent skin breakdown.       Follow Up Recommendations  SNF;Supervision/Assistance - 24 hour     Equipment Recommendations  Other (comment) (if going home will need hospital bed, lift)    Recommendations for Other Services       Precautions / Restrictions Precautions Precautions: Fall Precaution Comments: baseline R-sided hemiplegia Restrictions Weight Bearing Restrictions: No Other Position/Activity Restrictions: flaccid RUE/RLE    Mobility  Bed Mobility Overal bed mobility: Needs Assistance Bed Mobility: Rolling Rolling: Total assist;+2 for physical assistance         General bed mobility comments: Does not assist at all with mobility  Transfers Overall transfer level: Needs assistance               General transfer comment: Used maximove to transfer pt to bariatric chair.  Ambulation/Gait             General Gait Details: non-ambulatory   Stairs             Wheelchair Mobility    Modified Rankin (Stroke Patients Only)       Balance Overall balance assessment: Needs assistance    Sitting balance-Leahy Scale: Poor Sitting balance - Comments: leaning to left in sitting required pillows to support left side Postural control: Left lateral lean                                  Cognition Arousal/Alertness: Awake/alert Behavior During Therapy: Flat affect Overall Cognitive Status: No family/caregiver present to determine baseline cognitive functioning                                 General Comments: Follows simple commands; few verbalizations.      Exercises      General Comments        Pertinent Vitals/Pain Pain Assessment: No/denies pain    Home Living                      Prior Function            PT Goals (current goals can now be found in the care plan section) Acute Rehab PT Goals Patient Stated Goal: agreeable to SNF PT Goal Formulation: With patient Time For Goal Achievement: 09/08/20 Potential to Achieve Goals: Good Progress towards PT goals: Not progressing toward goals - comment (continues to require total assist for all mobility)    Frequency    Min 2X/week      PT  Plan Current plan remains appropriate    Co-evaluation              AM-PAC PT "6 Clicks" Mobility   Outcome Measure  Help needed turning from your back to your side while in a flat bed without using bedrails?: Total Help needed moving from lying on your back to sitting on the side of a flat bed without using bedrails?: Total Help needed moving to and from a bed to a chair (including a wheelchair)?: Total Help needed standing up from a chair using your arms (e.g., wheelchair or bedside chair)?: Total Help needed to walk in hospital room?: Total Help needed climbing 3-5 steps with a railing? : Total 6 Click Score: 6    End of Session   Activity Tolerance: Patient tolerated treatment well Patient left: in chair;with call bell/phone within reach;with nursing/sitter in room Nurse Communication: Mobility status PT Visit  Diagnosis: Other abnormalities of gait and mobility (R26.89);Hemiplegia and hemiparesis Hemiplegia - Right/Left: Right Hemiplegia - caused by: Cerebral infarction     Time: 1045-1110 PT Time Calculation (min) (ACUTE ONLY): 25 min  Charges:  $Therapeutic Activity: 23-37 mins                     [employer], PT, GCS 09/04/20,12:27 PM

## 2020-09-04 NOTE — Progress Notes (Signed)
MD received a page from RN that patient was complaining of chest pain but her vitals were stable. MD asked RN to get an EKG. Patient was evaluated at the bedside by Dr. Truman Hayward and Dr. Coy Saunas. When asked, patient described the chest pain as "burning" sensation in her epigastric area. Patient endorsed some nausea but denied vomiting, palpitations or shortness of breath. She repetitively asked to sit on the side of the bed and was informed that it was unsafe for her due to her right-sided weakness. On exam, patient seemed agitated, lungs CTAB, heart was tachycardic but normal rhythm, no murmurs, rubs or gallops.  EKG showed sinus tach, unchanged from previous. Patient symptoms not consistent with cardiac etiology but more likely secondary to her GI problems. Patient was offered Maalox but refused. Patient seemed mildly delirious on assessment so was placed on delirious precaution.

## 2020-09-05 DIAGNOSIS — K449 Diaphragmatic hernia without obstruction or gangrene: Secondary | ICD-10-CM | POA: Diagnosis not present

## 2020-09-05 DIAGNOSIS — E440 Moderate protein-calorie malnutrition: Secondary | ICD-10-CM

## 2020-09-05 DIAGNOSIS — N179 Acute kidney failure, unspecified: Secondary | ICD-10-CM | POA: Diagnosis not present

## 2020-09-05 DIAGNOSIS — M6282 Rhabdomyolysis: Secondary | ICD-10-CM | POA: Diagnosis not present

## 2020-09-05 DIAGNOSIS — K2950 Unspecified chronic gastritis without bleeding: Secondary | ICD-10-CM | POA: Diagnosis not present

## 2020-09-05 LAB — CBC
HCT: 21.5 % — ABNORMAL LOW (ref 36.0–46.0)
Hemoglobin: 6.8 g/dL — CL (ref 12.0–15.0)
MCH: 30 pg (ref 26.0–34.0)
MCHC: 31.6 g/dL (ref 30.0–36.0)
MCV: 94.7 fL (ref 80.0–100.0)
Platelets: 294 10*3/uL (ref 150–400)
RBC: 2.27 MIL/uL — ABNORMAL LOW (ref 3.87–5.11)
RDW: 18.1 % — ABNORMAL HIGH (ref 11.5–15.5)
WBC: 9.2 10*3/uL (ref 4.0–10.5)
nRBC: 0 % (ref 0.0–0.2)

## 2020-09-05 LAB — COMPREHENSIVE METABOLIC PANEL
ALT: 21 U/L (ref 0–44)
AST: 19 U/L (ref 15–41)
Albumin: 1.8 g/dL — ABNORMAL LOW (ref 3.5–5.0)
Alkaline Phosphatase: 62 U/L (ref 38–126)
Anion gap: 12 (ref 5–15)
BUN: 29 mg/dL — ABNORMAL HIGH (ref 6–20)
CO2: 16 mmol/L — ABNORMAL LOW (ref 22–32)
Calcium: 7.1 mg/dL — ABNORMAL LOW (ref 8.9–10.3)
Chloride: 110 mmol/L (ref 98–111)
Creatinine, Ser: 3.15 mg/dL — ABNORMAL HIGH (ref 0.44–1.00)
GFR calc Af Amer: 18 mL/min — ABNORMAL LOW (ref >60)
GFR calc non Af Amer: 16 mL/min — ABNORMAL LOW (ref >60)
Glucose, Bld: 97 mg/dL (ref 70–99)
Potassium: 4.3 mmol/L (ref 3.5–5.1)
Sodium: 138 mmol/L (ref 135–145)
Total Bilirubin: 0.7 mg/dL (ref 0.3–1.2)
Total Protein: 5 g/dL — ABNORMAL LOW (ref 6.5–8.1)

## 2020-09-05 LAB — HEMOGLOBIN AND HEMATOCRIT, BLOOD
HCT: 26.1 % — ABNORMAL LOW (ref 36.0–46.0)
Hemoglobin: 8.3 g/dL — ABNORMAL LOW (ref 12.0–15.0)

## 2020-09-05 LAB — PREPARE RBC (CROSSMATCH)

## 2020-09-05 LAB — MAGNESIUM: Magnesium: 1.8 mg/dL (ref 1.7–2.4)

## 2020-09-05 LAB — GLUCOSE, CAPILLARY: Glucose-Capillary: 95 mg/dL (ref 70–99)

## 2020-09-05 MED ORDER — SODIUM CHLORIDE 0.9% IV SOLUTION: Freq: Once | INTRAVENOUS | Status: DC

## 2020-09-05 NOTE — TOC Progression Note (Signed)
Transition of Care [facility]) - Progression Note    Patient Details  Name: Cristina Jordan MRN: [id number] Date of Birth: 1961-03-06  Transition of Care [facility]) CM/SW Contact  Bartholomew Crews, RN Phone Number: [phone] 09/05/2020, 3:15 PM  Clinical Narrative:     Patient in need of [place] PT/OT/ST. Referral accepted by [facility]. HH orders and Face to Face requested. Patient not medically stable for discharge at this time per MD, however, transition plans ongoing in preparation.   Spoke with patient's daughter, [name], to advise of accepting [facility] agency. Discussed PCS arrangements in progress. [name] stated that [name] at [facility] has already reached out to her concerning PCS.   Also provided [name] with information about patient's transportation benefit - [place]. Patient has unlimited trips for medical purposes and 10 community trips/year. Trips must be within 75 miles unless prior authorization received.   [name] is working on finding out who patient's assigned PCP is through [employer]. Dr. Konrad Penta kindly agreeed to sign [facility] orders for patient after discharge.   DME requests sent to [employer] for bariatric wheelchair and hoyer lift. Discussed with [name] that patient may not be eligible for wheelchair at this time, however, request was submitted. [name] verbalized understanding and expressed appreciation for efforts.   TOC following for transition needs.   Expected Discharge Plan: [place] Barriers to Discharge: Continued Medical Work up  Expected Discharge Plan and Services Expected Discharge Plan: [place] arrangements for the past 2 months: Single Family Home                                       Social Determinants of Health (SDOH) Interventions    Readmission Risk Interventions No flowsheet data found.

## 2020-09-05 NOTE — Progress Notes (Signed)
Occupational Therapy Treatment Patient Details Name: Cristina Jordan MRN: [id number] DOB: Sep 10, 1961 Today's Date: 09/05/2020    History of present illness 59 year old woman with h/o CVA, WC bound, presenting with worsening weakness.  She also has nausea, vomiting, decreased PO intake, abdominal pain, diarrhea X 1 week and weak to the point of not being able to get out of bed.  She was found to have colitis, Severe AKI, and electrolyte abnormalities.     OT comments  Patient supine in bed and agreeable to OT/PT session. Declined OOB to chair, but reports need to use bedpan. +3 to roll in bed and place bedpan, unsuccessful with BM. Educated on techniques to increase ease/participation with tasks to decrease burden of care, requires cueing to recall techniques during 2nd roll and overall total assist +2-3 to roll.  She was educated on exercises to L UE and completed using theraband--bring handout next session.  Per chart noted DC plan is to dc home, pt reports 2 daughters will assist 24/7 with transfers and self care--she reports no concerns if dc home, but poor awareness to deficits. IF she were to dc home will need hoyer lift and hospital bed.  Will follow acutely.    Follow Up Recommendations  SNF     Equipment Recommendations  Hospital bed;Other (comment) (Hoyer lift --IF going home )    Recommendations for Other Services      Precautions / Restrictions Precautions Precautions: Fall Precaution Comments: baseline R-sided hemiplegia Restrictions Weight Bearing Restrictions: No       Mobility Bed Mobility Overal bed mobility: Needs Assistance Bed Mobility: Rolling Rolling: Total assist;+2 for physical assistance         General bed mobility comments: pt demonstrates ability to assist minimally with R LE and R UE, but requires cueing to initate techniques to assist   Transfers                      Balance                                            ADL either performed or assessed with clinical judgement   ADL Overall ADL's : Needs assistance/impaired                             Toileting- Clothing Manipulation and Hygiene: Total assistance;+2 for physical assistance;+2 for safety/equipment;Bed level Toileting - Clothing Manipulation Details (indicate cue type and reason): +3 assist to roll onto bed pan, pt unable to void BM      Functional mobility during ADLs: Total assistance;+2 for physical assistance;+2 for safety/equipment (rolling in bed) General ADL Comments: pt declined OOB to chair today      Vision       Perception     Praxis      Cognition Arousal/Alertness: Awake/alert Behavior During Therapy: Flat affect Overall Cognitive Status: No family/caregiver present to determine baseline cognitive functioning                                 General Comments: follows simple commands, decreased awareness of deficits and need for assistance.         Exercises Exercises: Other exercises Other Exercises Other Exercises: Educated on L UE exercises with orange theraband,  completing 10 reps 1 set of: elbow flexion, overhead press, and diagonal punches (simulating reaching towards bed rail for bed mobility)    Shoulder Instructions       General Comments Discussed needs for DC home- IF dc home, pt will need hoyer lift and hopsital bed.  She reports she will have her 2 daughters there 24/7 to assist as needed.  Patient continues to present with poor awareness to need for incresed assist, reports no concerns for dc home and she will be able to "move better at home"     Pertinent Vitals/ Pain       Pain Assessment: No/denies pain  Home Living                                          Prior Functioning/Environment              Frequency  Min 2X/week        Progress Toward Goals  OT Goals(current goals can now be found in the care plan section)  Progress  towards OT goals: Progressing toward goals  Acute Rehab OT Goals Patient Stated Goal: to go home OT Goal Formulation: With patient  Plan Discharge plan remains appropriate;Frequency remains appropriate    Co-evaluation    PT/OT/SLP Co-Evaluation/Treatment: Yes Reason for Co-Treatment: For patient/therapist safety;To address functional/ADL transfers   OT goals addressed during session: ADL's and self-care      AM-PAC OT "6 Clicks" Daily Activity     Outcome Measure   Help from another person eating meals?: A Little Help from another person taking care of personal grooming?: A Lot Help from another person toileting, which includes using toliet, bedpan, or urinal?: Total Help from another person bathing (including washing, rinsing, drying)?: Total Help from another person to put on and taking off regular upper body clothing?: Total Help from another person to put on and taking off regular lower body clothing?: Total 6 Click Score: 9    End of Session    OT Visit Diagnosis: Unsteadiness on feet (R26.81);Other abnormalities of gait and mobility (R26.89);Muscle weakness (generalized) (M62.81);Hemiplegia and hemiparesis;Pain Hemiplegia - Right/Left: Right   Activity Tolerance Patient tolerated treatment well   Patient Left in bed;with call bell/phone within reach;with bed alarm set   Nurse Communication Mobility status        Time: 3817-7116 OT Time Calculation (min): 32 min  Charges: OT General Charges $OT Visit: 1 Visit OT Treatments $Self Care/Home Management : 8-22 mins  [name], [place] Pager [phone] Office [phone]    [name] 09/05/2020, 12:15 PM

## 2020-09-05 NOTE — Progress Notes (Cosign Needed)
    Durable Medical Equipment  (From admission, onward)         Start     Ordered   09/05/20 1349  For home use only DME standard manual wheelchair with seat cushion  Once       Comments: Patient suffers from CVA which impairs their ability to perform daily activities like ADLs in the home.  A walker will not resolve issue with performing activities of daily living. A wheelchair will allow patient to safely perform daily activities. Patient can safely propel the wheelchair in the home or has a caregiver who can provide assistance. Length of need lifetime. HD - bariatric Accessories: elevating leg rests (ELRs), wheel locks, extensions and anti-tippers.   09/05/20 1350

## 2020-09-05 NOTE — TOC Progression Note (Signed)
Transition of Care [facility]) - Progression Note    Patient Details  Name: Cristina Jordan MRN: [id number] Date of Birth: 25-Dec-1960  Transition of Care [facility]) CM/SW Contact  Bartholomew Crews, RN Phone Number: [phone] 09/05/2020, 1:30 PM  Clinical Narrative:     Received call from [name] at [facility] in response to request for [facility] services at home. Typically services can be arranged within 48 hours of patient discharge home. [name] has already spoke with patient on the phone, and she will reach out to patient's daughter.   At time of discharge patient will need ambulance transportation. Transportation arrangements should be made through [facility].   TOC following for transition needs.   Expected Discharge Plan: [place] Barriers to Discharge: Continued Medical Work up  Expected Discharge Plan and Services Expected Discharge Plan: [place] arrangements for the past 2 months: Single Family Home                                       Social Determinants of Health (SDOH) Interventions    Readmission Risk Interventions No flowsheet data found.

## 2020-09-05 NOTE — Progress Notes (Signed)
[facility] Progress Note  CC:  Nausea/vomiting, diarrhea and abdominal pain   Subjective: No chest pain today. She denies having any difficulty swallowing today. I spoke to her day shift RN, patient has swallowed water only. She did not wish to eat any solid food today. No diarrhea or BM today. She had a smear of yellowish stool as reported by her nurse.   No abdominal pain.                                             Objective:  Vital signs in last 24 hours: Temp:  [97.9 F (36.6 C)-98.7 F (37.1 C)] 98.7 F (37.1 C) (09/24 1257) Pulse Rate:  [97-117] 100 (09/24 1257) Resp:  [15-16] 16 (09/24 1257) BP: (135-169)/(88-100) 159/94 (09/24 1257) SpO2:  [100 %] 100 % (09/24 1257) Weight:  [149.7 kg] 149.7 kg (09/23 2040) Last BM Date: 09/04/20 General:   Alert, awake in NAD.  Heart: RRR, no murmur.  Pulm:  Breath sounds with few scattered wheezes, diminished in the bases.  Abdomen: Obese abdomen, nondistended. Nontender. + BS x 4 quads.  Extremities:  LEs with 1+ edema.  Neurologic:  Alert and  oriented x 3; She is answering questions appropriately.  Psych:  Alert and cooperative. Normal mood and affect.  Intake/Output from previous day: 09/23 0701 - 09/24 0700 In: 1606.3 [P.O.:420; I.V.:1186.3] Out: 400 [Urine:400] Intake/Output this shift: Total I/O In: -  Out: 900 [Urine:900]  Lab Results: Recent Labs    09/03/20 0607 09/04/20 0422 09/05/20 0410  WBC 10.4 10.8* 9.2  HGB 7.1* 7.0* 6.8*  HCT 22.7* 21.5* 21.5*  PLT 243 275 294   BMET Recent Labs    09/03/20 0430 09/04/20 0422 09/05/20 0415  NA 138 140 138  K 4.0 4.3 4.3  CL 111 114* 110  CO2 16* 16* 16*  GLUCOSE 99 97 97  BUN 32* 30* 29*  CREATININE 3.27* 3.03* 3.15*  CALCIUM 7.2* 7.0* 7.1*   LFT Recent Labs    09/05/20 0415  PROT 5.0*  ALBUMIN 1.8*  AST 19  ALT 21  ALKPHOS 62  BILITOT 0.7   PT/INR No results for input(s): LABPROT, INR in the last 72 hours. Hepatitis  Panel No results for input(s): HEPBSAG, HCVAB, HEPAIGM, HEPBIGM in the last 72 hours.  DG ESOPHAGUS W SINGLE CM (SOL OR THIN BA)  Result Date: 09/04/2020 CLINICAL DATA:  Difficulty swallowing foods EXAM: ESOPHOGRAM/BARIUM SWALLOW TECHNIQUE: Single contrast examination was performed using  thin barium. FLUOROSCOPY TIME:  Fluoroscopy Time:  1 minutes 36 seconds Radiation Exposure Index (if provided by the fluoroscopic device): Number of Acquired Spot Images: 0 COMPARISON:  None. FINDINGS: Disruption of primary esophageal peristaltic waves with tertiary contractions in the mid and distal esophagus. No fixed strictures. IMPRESSION: Esophageal dysmotility. Electronically Signed   By: Rolm Baptise M.D.   On: 09/04/2020 14:56    Assessment / Plan:  24. 59 year old female with nausea/vomiting and dysphagia. EGD showed a tortuous esophagus, 4 cm HH, nonbleeding duodenal ulcers. Biopsies showed chronic gastritis and duodenitis. No evidence of H. Pylori. Barium swallow 9/23 showed tertiary contractions in the mid and distal esophagus consistent with esophageal dysmotility, no stricture.  -Continue Pantoprazole 40mg  IV bid.  -Reglan ok to use  if QTC norma.  -SLP swallow study if not already done. Consider esophageal manometry as outpatient.  -  Further recommendations per Dr. Bryan Lemma   2. Diarrhea. C. Diff and GI pathogen panel 9/5 were negative. S/P flex sig 09/10/2020 showed stool in the entire colon, normal mucosa in the left colon. Diverticulosis in the rectosigmoid colon.  Biopsies suggestive of acute self limited infectious colitis. CTAP 09/02/2020 showed persistent long segment of inflammation to the descending and sigmoid colon and mild ascites. No diarrhea today.  -Continue to monitor BMs Q shiftt  3. Macrocytic anemia. Hg 7.0 -> 6.8. MCV 94.7. One unit of PRBCs infusing at this time. No obvious signs of active GI bleeding. Iron levels normal 2 weeks ago.  -Repeat H/H post transfusion -CBC in am.  Repeat iron panel in am.   4. Abnormal thickening endometrium, on Megace   5. CKD. Cr 3.15.   6. History of a CVA    Active Problems:   Rhabdomyolysis   Hypokalemia   Hypocalcemia   Acute renal failure (HCC)   Malnutrition of moderate degree   Palliative care by specialist   DNR (do not resuscitate) discussion   Lethargy   DNR (do not resuscitate)   Weakness generalized   Vaginal bleeding   Colitis   Chronic gastritis without bleeding   Morbid obesity ([place])   Hiatal hernia   Non-intractable vomiting   Regurgitation of food   Iron deficiency anemia due to chronic blood loss     LOS: 20 days   Cristina Jordan  09/05/2020, 1:08 PM

## 2020-09-05 NOTE — Progress Notes (Signed)
Nutrition Follow-up  DOCUMENTATION CODES:   Non-severe (moderate) malnutrition in context of chronic illness  INTERVENTION:  Recommend initiation of enteral nutrition. Consider: -Osmolite 1.5 cal @ 61m/hr, advance 132mhr Q4H until goal rate of 6074mr is reached -61m25mosource TF daily  At goal, recommended tube feeding would provide 2200 kcals, 101 grams of protein, 1097ml22me water   NUTRITION DIAGNOSIS:   Moderate Malnutrition related to chronic illness (chronic renal insufficiency) as evidenced by percent weight loss, mild muscle depletion, energy intake < 75% for > or equal to 1 month (22% weight loss x 3 months).  Ongoing  GOAL:   Patient will meet greater than or equal to 90% of their needs  Not met  MONITOR:   PO intake, Supplement acceptance, Diet advancement, Skin, Labs  REASON FOR ASSESSMENT:   Malnutrition Screening Tool    ASSESSMENT:   58 yo65emale admitted with severe AKI, hypokalemia, hypocalcemia, hypomagnesemia. PMH includes CVA with residual R sided paralysis, wheelchair bound, HTN, chronic renal insufficiency.  9/10 s/p EGD and flexible sigmoidoscopy  Per GI, pt with nausea/vomiting and dysphagia. EGD showed a tortuous esophagus, 4 cm HH, nonbleeding duodenal ulcers. Biopsies showed chronic gastritis and duodenitis. No evidence of H. Pylori. Barium swallow 9/23 showed tertiary contractions in the mid and distal esophagus consistent with esophageal dysmotility, no stricture.   Pt denies nausea but continues to gag and have intermittent vomiting. Pt also continues to have diarrhea. Recommend initiation of enteral nutrition. Pt would likely benefit from a postpyloric feeding tube.   PO Intake: 0-30% x last 8 recorded meals (4% average meal intake)  Labs reviewed.  Medications: Megace, Protonix, Sodium Bicarbonate, Carafate  Diet Order:   Diet Order            DIET DYS 2 Room service appropriate? No; Fluid consistency: Thin  Diet effective now                  EDUCATION NEEDS:   Not appropriate for education at this time  Skin:  Skin Assessment: Reviewed RN Assessment  Last BM:  9/23  Height:   Ht Readings from Last 1 Encounters:  08/14/2020 5' 6.5" (1.689 m)    Weight:   Wt Readings from Last 1 Encounters:  09/04/20 (!) 149.7 kg    Ideal Body Weight:  60.2 kg  BMI:  Body mass index is 52.47 kg/m.  Estimated Nutritional Needs:   Kcal:  1900-2200  Protein:  100-130 gm  Fluid:  1.9-2.2 L    AmandLarkin Ina RD, LDN RD pager number and weekend/on-call pager number located in [email or web address]

## 2020-09-05 NOTE — Progress Notes (Signed)
Subjective:   Today, Cristina Jordan states that she is feeling better. She says that she has only experienced 1 episode of non-bloody vomiting since yesterday and continues to deny nausea or abdominal pain. She has gone several days without abdominal pain. She does say her throat hurts which she attributes to her vomiting but refuses medications for this. She says her diarrhea has been improving and is unsure if she has had any GI or vaginal bleeding. She says that she does feel thirsty but continues to make urine. Denies any CP, SOB, palpitations or other symptoms.   Objective:  Vital signs in last 24 hours: Vitals:   09/05/20 0451 09/05/20 0915 09/05/20 1230 09/05/20 1257  BP: (!) 135/91 (!) 147/90 (!) 149/97 (!) 159/94  Pulse: 98 97 99 100  Resp: 15 16 15 16   Temp: 98.3 F (36.8 C) 97.9 F (36.6 C) 98.4 F (36.9 C) 98.7 F (37.1 C)  TempSrc: Oral Oral Oral Oral  SpO2: 100% 100% 100% 100%  Weight:      Height:       General: Patient is morbidly obese. She appears uncomfortable and tired. HENT: Mucus membranes are moist. Edentulous.  Respiratory: Anterior lung sounds decreased but CTA, bilaterally. No tachypnea. Cardiovascular: Rate is borderline tachycardic. Rhythm is regular. No murmurs, rubs, or gallops. Soft heart sounds.  Neurological: Patient has R-sided hemiparesis. She is alert and oriented. Abdominal: Soft without TTP, guarding, or rebound. No distention. Bowel sounds quiet. Genitourinary: Foley is in place, draining urine. Skin: Warm and dry. No rashes noted.  Assessment/Plan:  Active Problems:   Rhabdomyolysis   Hypokalemia   Hypocalcemia   Acute renal failure (HCC)   Malnutrition of moderate degree   Palliative care by specialist   DNR (do not resuscitate) discussion   Lethargy   DNR (do not resuscitate)   Weakness generalized   Vaginal bleeding   Colitis   Chronic gastritis without bleeding   Morbid obesity ([place])   Hiatal hernia   Non-intractable  vomiting   Regurgitation of food   Iron deficiency anemia due to chronic blood loss  # Chest Pain, Resolved Cardiac workup not consistent with ACS. Intermittent Q waves on EKG may suggest RH strain although not obvious on ECHO. High risk for DVT given unable to be on medical DVT PPx due to unstable hemoglobin and potential for endometrial carcinoma and immobility. However, unable to obtain V/Q scan due to morbid obesity and kidneys would not tolerate contrast for CTA. CP currently resolved. May be GI in nature given esophageal dysmotility on Barium esophagram. - Repeat EKG showed QTc prolongation to 478 - Continue Diltiazem 60mg  four times daily  - Hold on antiemetics other than scopolamine for today   # Acute Colitis and Chronic Duodenitis/Gastritis EGD biopsies suggestive of chronic peptic duodenitis, chronic gastritis with reactive esophagitis, and flex sigmoidoscopy consistent with likely acute, infectious, self-limited colitis. Repeat CT abdomen / pelvis 09/02/20 showed improved colitis that may be ischemic in nature with gastric inflammation. Patient's vomiting and diarrhea continue although improved and patient denies nausea or abdominal pain. Vomiting most likely due to upper GI source, most likely esophageal dysmotility.  - Continue PPI IV BID for 4 weeks then drop to 1x daily for 8 weeks followed by EGD in 3 months to document healing, per GI. - Avoid NSAIDs - Attempted to transition many of her oral medications to liquids, although patient continues not to take these regularly secondary to vomiting - Patient declining throat spray and sucralfate  -  continue Diltiazem 60mg  four times daily - continue scopolamine patch - Continue to encourage fluid intake - Hold Imodium given GI recs  - Patient may benefit from metoclopramide in future if QTc normalizes - consider esophageal manometry as outpatient  - Monitor EKGs  # Severe AKI on likely Chronic Renal Failure stage IIIb with  Intermittent, Mild Metabolic Acidosis  Creatinine on arrival was 10.23 (up from 1.7 in 2019), BUN 73, GFR 4. Creatinine and BUN stable at 3.15 and 29 respectively this morning . Likely prerenal due to prolonged poor PO intake and diarrhea. Renal Ultrasound showed increased echogenicity consistent with medical renal disease. Patient appears euvolemic. Continues to have stable NAGMA. - Nephrology signed off given stability/improvement; however, patient is a poor dialysis candidate. PT and OT recommend SNF placement; patient has expressed desire to go home - Ordered [facility] SNF placement consult; however, patient prefers discharge to home. Daughter is agreeable if there is considerable assistance at home. CM currently able to set up OT, PT, ST, and hopeful for PCS early next week for discharge home to family if this is in line with patient and family's wishes. Will continue discussions with family. - Increased LR from 50 to 40mL/hr given decreased urine output - Monitor Strict I&O - Continue to monitor daily renal function panel   # Electrolyte Abnormalities with Moderate Malnutrition Patient has only been consuming broth for at least 2 weeks at home with N/V/D. Nutrition endorses 22% weight loss over past 3 months.  Potassium on arrival was less than 2, corrected calcium was 6.8, magnesium was 1.4 likely secondary to poor p.o. intake, nausea vomiting and diarrhea. Prolonged QTc. IJ tunnel cath placed 1/61/09 without complication. - Electrolytes stable this morning - Albumin low, encouraged PO intake as tolerated  # Worsening Normocytic Anemia Likely due to vaginal bleeding and worsening CKD. Her nausea may be 2/2 undiagnosed gynecological malignancy. Hgb 6.8 today. Patient denies known bleeding today.  - OB-GYN consulted; patient will be scheduled to follow up OP for endometrial Bx at [place] [Dr. Emeterio Reeve will message the office] if kidney function continues to improve - Patient has not  been receiving Megestrol 40mg  BID regularly; encourage regular medication dosing as tolerated - Will check post-transfusion CBC after 1 unit PRBC's - Will hold off on iron for now given GI inflammation though will likely require this vs. EPO in future - Patient is refusing SCDs this morning; will discuss with her  # Hypertension  Discontinued amlodipine given she is on Diltiazem. - continue to monitor on Diltiazem four times daily   Prior to Admission Living Arrangement: Home  Anticipated Discharge Location: Home with HH PT, OT, ST, hopeful PCS, wheelchair and lift. Will need to discuss with daughter. Barriers to Discharge: Decreased PO intake; Vomiting, diarrhea, hemoglobin stability  Code Status: DNR/DNI Diet: Dysphagia 2 diet without fluid restriction IVF: LR 21mL/hr DVT PPx: SCD's given vaginal bleeding  Jeralyn Bennett, MD 09/05/2020, 2:59 PM Pager: [phone] After 5pm on weekdays and 1pm on weekends: On Call pager [phone]

## 2020-09-05 NOTE — Progress Notes (Signed)
Physical Therapy Treatment Patient Details Name: Cristina Jordan MRN: [id number] DOB: [birth year] Today's Date: 09/05/2020    History of Present Illness 59 year old woman with h/o CVA, WC bound, presenting with worsening weakness.  She also has nausea, vomiting, decreased PO intake, abdominal pain, diarrhea X 1 week and weak to the point of not being able to get out of bed.  She was found to have colitis, Severe AKI, and electrolyte abnormalities.      PT Comments    The pt was in bed upon the arrival of PT/OT, and agreeable to session today. PT/OT originally planned to work on progression of OOB mobility with transfer to chair and strengthening from [place], but the pt declined further transfers to the bariatric recliner stating it is not comfortable for her. The pt elected to complete bed mobility and bed-level exercises at this time. She continues to require total assist of at least 2 to complete rolling in the bed, and therefore it is not yet safe to attempt sitting EOB due to lack of strength, initiation, trunk control, and safety awareness that become obvious during attempts to complete rolling in the bed. The pt states she would like to return home with assist from her daughters, but due to the significant level of assist needed at this time, I feel that d/c to SNF for continued rehab and strengthening would be the best, safest option for the pt and her caregivers.     Follow Up Recommendations  SNF;Supervision/Assistance - 24 hour     Equipment Recommendations  Other (comment) (if going home will need hospital bed and hoyer lift)    Recommendations for Other Services       Precautions / Restrictions Precautions Precautions: Fall Precaution Comments: baseline R-sided hemiplegia Restrictions Weight Bearing Restrictions: No Other Position/Activity Restrictions: flaccid RUE/RLE    Mobility  Bed Mobility Overal bed mobility: Needs Assistance Bed Mobility: Rolling Rolling: Total  assist;+2 for physical assistance;+2 for safety/equipment         General bed mobility comments: pt demonstrates ability to assist minimally with R LE and R UE, but requires cueing to initate techniques to assist   Transfers                 General transfer comment: pt declined OOB mobility to bariatric recliner today due to reports of reliner being uncomfortable      Balance                                            Cognition Arousal/Alertness: Awake/alert Behavior During Therapy: Flat affect Overall Cognitive Status: No family/caregiver present to determine baseline cognitive functioning                                 General Comments: follows simple commands, decreased awareness of deficits and need for assistance.       Exercises General Exercises - Lower Extremity Ankle Circles/Pumps: AROM;Left;10 reps;Supine Heel Slides: Left;10 reps;Supine;AAROM (AAROM for hip and knee flex, against max resistance for hip and knee ext) Hip ABduction/ADduction: AAROM;Left;Supine;10 reps Other Exercises Other Exercises: Educated on L UE exercises with orange theraband, completing 10 reps 1 set of: elbow flexion, overhead press, and diagonal punches (simulating reaching towards bed rail for bed mobility)     General Comments General comments (skin integrity, edema,  etc.): Discussed needs for DME and continued assist if plan is to now d/c home. pt will need hoyer lift and hospital bed to allow for the pt to assist at all with bed mobility and to allow for any OOB mobility. The pt states she has 2 daughters present 24/7 to assist, but continues to present with poor insight to deficits and level of assist needed. the pt continues to voice that she will be able to "move better at home"      Pertinent Vitals/Pain Pain Assessment: No/denies pain Faces Pain Scale: No hurt Pain Intervention(s): Monitored during session           PT Goals (current  goals can now be found in the care plan section) Acute Rehab PT Goals Patient Stated Goal: to go home PT Goal Formulation: With patient Time For Goal Achievement: 09/08/20 Potential to Achieve Goals: Good Progress towards PT goals: Not progressing toward goals - comment (continues to require total assist for all mobility)    Frequency    Min 2X/week      PT Plan Current plan remains appropriate    Co-evaluation PT/OT/SLP Co-Evaluation/Treatment: Yes Reason for Co-Treatment: For patient/therapist safety;To address functional/ADL transfers PT goals addressed during session: Mobility/safety with mobility;Strengthening/ROM OT goals addressed during session: ADL's and self-care      AM-PAC PT "6 Clicks" Mobility   Outcome Measure  Help needed turning from your back to your side while in a flat bed without using bedrails?: Total Help needed moving from lying on your back to sitting on the side of a flat bed without using bedrails?: Total Help needed moving to and from a bed to a chair (including a wheelchair)?: Total Help needed standing up from a chair using your arms (e.g., wheelchair or bedside chair)?: Total Help needed to walk in hospital room?: Total Help needed climbing 3-5 steps with a railing? : Total 6 Click Score: 6    End of Session   Activity Tolerance: Patient tolerated treatment well Patient left: in bed Nurse Communication: Mobility status PT Visit Diagnosis: Other abnormalities of gait and mobility (R26.89);Hemiplegia and hemiparesis Hemiplegia - Right/Left: Right Hemiplegia - caused by: Cerebral infarction     Time: 1111-1145 PT Time Calculation (min) (ACUTE ONLY): 34 min  Charges:  $Therapeutic Activity: 8-22 mins                     Karma Ganja, PT, DPT   Acute Rehabilitation Department Pager #: [phone]   Otho Bellows 09/05/2020, 1:04 PM

## 2020-09-06 DIAGNOSIS — N179 Acute kidney failure, unspecified: Secondary | ICD-10-CM | POA: Diagnosis not present

## 2020-09-06 DIAGNOSIS — M6282 Rhabdomyolysis: Secondary | ICD-10-CM | POA: Diagnosis not present

## 2020-09-06 DIAGNOSIS — K2950 Unspecified chronic gastritis without bleeding: Secondary | ICD-10-CM | POA: Diagnosis not present

## 2020-09-06 DIAGNOSIS — R070 Pain in throat: Secondary | ICD-10-CM

## 2020-09-06 DIAGNOSIS — K449 Diaphragmatic hernia without obstruction or gangrene: Secondary | ICD-10-CM | POA: Diagnosis not present

## 2020-09-06 LAB — BPAM RBC
Blood Product Expiration Date: 202110272359
ISSUE DATE / TIME: 202109241226
Unit Type and Rh: 5100

## 2020-09-06 LAB — GASTROINTESTINAL PANEL BY PCR, STOOL (REPLACES STOOL CULTURE)

## 2020-09-06 LAB — CBC
HCT: 25.6 % — ABNORMAL LOW (ref 36.0–46.0)
Hemoglobin: 8 g/dL — ABNORMAL LOW (ref 12.0–15.0)
MCH: 29 pg (ref 26.0–34.0)
MCHC: 31.3 g/dL (ref 30.0–36.0)
MCV: 92.8 fL (ref 80.0–100.0)
Platelets: 311 10*3/uL (ref 150–400)
RBC: 2.76 MIL/uL — ABNORMAL LOW (ref 3.87–5.11)
RDW: 17.4 % — ABNORMAL HIGH (ref 11.5–15.5)
WBC: 8.4 10*3/uL (ref 4.0–10.5)
nRBC: 0 % (ref 0.0–0.2)

## 2020-09-06 LAB — RENAL FUNCTION PANEL
Albumin: 2 g/dL — ABNORMAL LOW (ref 3.5–5.0)
Anion gap: 8 (ref 5–15)
BUN: 29 mg/dL — ABNORMAL HIGH (ref 6–20)
CO2: 14 mmol/L — ABNORMAL LOW (ref 22–32)
Calcium: 7.1 mg/dL — ABNORMAL LOW (ref 8.9–10.3)
Chloride: 113 mmol/L — ABNORMAL HIGH (ref 98–111)
Creatinine, Ser: 3.02 mg/dL — ABNORMAL HIGH (ref 0.44–1.00)
GFR calc Af Amer: 19 mL/min — ABNORMAL LOW (ref >60)
GFR calc non Af Amer: 17 mL/min — ABNORMAL LOW (ref >60)
Glucose, Bld: 93 mg/dL (ref 70–99)
Phosphorus: 3 mg/dL (ref 2.5–4.6)
Potassium: 4.4 mmol/L (ref 3.5–5.1)
Sodium: 135 mmol/L (ref 135–145)

## 2020-09-06 LAB — TYPE AND SCREEN
ABO/RH(D): O POS
Antibody Screen: NEGATIVE
Unit division: 0

## 2020-09-06 LAB — GLUCOSE, CAPILLARY
Glucose-Capillary: 90 mg/dL (ref 70–99)
Glucose-Capillary: 84 mg/dL (ref 70–99)

## 2020-09-06 LAB — MAGNESIUM: Magnesium: 1.6 mg/dL — ABNORMAL LOW (ref 1.7–2.4)

## 2020-09-06 LAB — IRON AND TIBC
Iron: 59 ug/dL (ref 28–170)
Saturation Ratios: 40 % — ABNORMAL HIGH (ref 10.4–31.8)
TIBC: 148 ug/dL — ABNORMAL LOW (ref 250–450)
UIBC: 89 ug/dL

## 2020-09-06 LAB — FERRITIN: Ferritin: 196 ng/mL (ref 11–307)

## 2020-09-06 MED ORDER — MAGNESIUM SULFATE 2 GM/50ML IV SOLN
2.0000 g | Freq: Once | INTRAVENOUS | Status: AC
  Administered 2020-09-06: 2 g via INTRAVENOUS
  Filled 2020-09-06: qty 50

## 2020-09-06 MED ORDER — METOCLOPRAMIDE HCL 5 MG/ML IJ SOLN
5.0000 mg | Freq: Three times a day (TID) | INTRAMUSCULAR | Status: AC
  Administered 2020-09-06 – 2020-09-09 (×7): 5 mg via INTRAVENOUS
  Filled 2020-09-06 (×9): qty 2

## 2020-09-06 MED ORDER — MAGNESIUM SULFATE IN D5W 1-5 GM/100ML-% IV SOLN
1.0000 g | Freq: Once | INTRAVENOUS | Status: AC
  Administered 2020-09-06: 1 g via INTRAVENOUS
  Filled 2020-09-06: qty 100

## 2020-09-06 MED ORDER — DILTIAZEM 12 MG/ML ORAL SUSPENSION
60.0000 mg | Freq: Four times a day (QID) | ORAL | Status: DC
  Administered 2020-09-06 – 2020-09-12 (×17): 60 mg via ORAL
  Filled 2020-09-06 (×26): qty 6

## 2020-09-06 MED ORDER — DILTIAZEM 12 MG/ML ORAL SUSPENSION: 60.0000 mg | Freq: Three times a day (TID) | ORAL | Status: DC

## 2020-09-06 NOTE — Progress Notes (Signed)
Subjective:   Ms. Myhre states that she continues to feel better. She does continue to endorse throat pain which she attributes to her vomiting, and states that she stopped taking medications by mouth because of her throat pain. Nursing staff report that she has not been having vaginal bleeding and that her diarrhea is not dark and has no obvious blood in it. Patient continues to deny nausea, abdominal pain, CP, palpitations or any other symptoms. She has improved appetite, tolerated soup well, and has a strong desire to go home.  Objective:  Vital signs in last 24 hours: Vitals:   09/05/20 1515 09/05/20 2047 09/06/20 0432 09/06/20 1032  BP: (!) 153/98 140/69 (!) 151/91 (!) 150/88  Pulse: (!) 104 (!) 107 (!) 104 (!) 102  Resp: 15 16 16 16   Temp: 98 F (36.7 C) 98.8 F (37.1 C) 98.1 F (36.7 C) 98.2 F (36.8 C)  TempSrc: Oral Oral Oral Oral  SpO2: 100% 100% 100% 100%  Weight:      Height:       General: Patient is morbidly obese. She is resting comfortably in no acute distress. HENT: Mucus membranes are moist. Edentulous.  Respiratory: Anterior lung sounds decreased but CTA, bilaterally. No tachypnea. Cardiovascular: Rate is tachycardic. Rhythm is regular. No murmurs, rubs, or gallops. Soft heart sounds.  Neurological: Patient has R-sided hemiparesis. She is alert and oriented. Abdominal: Soft without TTP, guarding, or rebound. No distention. Bowel sounds quiet. Genitourinary: Foley is in place, draining urine. Skin: Warm and dry. No rashes noted.  Assessment/Plan:  Active Problems:   Rhabdomyolysis   Hypokalemia   Hypocalcemia   Acute renal failure (HCC)   Malnutrition of moderate degree   Palliative care by specialist   DNR (do not resuscitate) discussion   Lethargy   DNR (do not resuscitate)   Weakness generalized   Vaginal bleeding   Colitis   Chronic gastritis without bleeding   Morbid obesity ([place])   Hiatal hernia   Non-intractable vomiting    Regurgitation of food   Iron deficiency anemia due to chronic blood loss   # Improving Subacute Colitis # Chronic Duodenitis/Gastritis # Likely Esophageal Dysmotility EGD biopsies suggestive of chronic peptic duodenitis, chronic gastritis with reactive esophagitis, and flex sigmoidoscopy consistent with likely acute, infectious, self-limited colitis. Repeat CT abdomen / pelvis 09/02/20 showed improved colitis that may be ischemic in nature with gastric inflammation. Patient's vomiting and diarrhea continue although improved and patient denies nausea or abdominal pain. Esophogram showed likely mid-distal esophageal dysmotility; most likely the cause for her continued vomiting. - GI have signed off given improvement - Continue PPI IV BID for 4 weeks then decrease to 1x daily for 8 weeks followed by EGD in 3 months to document healing, per GI. - Avoid NSAIDs - Attempted to transition many of her oral medications to liquids, although patient continues not to take these regularly secondary to vomiting - Patient declining throat spray and sucralfate  - switched Diltiazem from tablet to liquid form, educating patient and nurse on hopeful benefit of this medication in particular in controlling symptoms - continue scopolamine patch - Hold metoclopramide for now given hx of intermittent prolonged QTc on EKG's - Hold imodium per GI recs - Continue to encourage PO intake; encouraged patient to order food off menu (dysphagia 2 diet) - consider esophageal manometry as outpatient   # Improving AKI on likely Chronic Renal Failure stage IIIb with Worsening NAGMA  Creatinine on arrival was 10.23 (up from 1.7 in 2019),  BUN 73, GFR 4. Creatinine and BUN stabilizing ~ 3 and 29, respectively . Likely prerenal due to prolonged poor PO intake and diarrhea. Renal Ultrasound showed increased echogenicity consistent with medical renal disease. Patient appears euvolemic. NAGMA slightly worsening, not tolerating NaHCO3 PO. -  Nephrology signed off given stability/improvement; however, patient is a poor dialysis candidate. PT and OT have recommended SNF placement; however, patient has expressed desire to go home.  - CM currently able to set up [facility] OT, PT, ST, and hopeful for PCS early next week for discharge home to family if this is in line with patient and family's wishes.  - DME orders placed for wheelchair and lift - Continue LR 20mL/hr - Monitor Strict I&O - Continue to monitor daily renal function  # Electrolyte Abnormalities with Moderate Malnutrition Patient has only been consuming broth for at least 2 weeks at home with N/V/D. Nutrition endorses 22% weight loss over past 3 months.  Potassium on arrival was less than 2, corrected calcium was 6.8, magnesium was 1.4 likely secondary to poor p.o. intake, nausea vomiting and diarrhea. Prolonged QTc. IJ tunnel cath placed 0/23/34 without complication. - Mg 1.6 this morning, given 2g magnesium sulfate - Other electrolytes stable  - Albumin low, encouraged PO intake as tolerated  # Stable Normocytic Anemia Likely due to vaginal bleeding and worsening CKD. Her nausea may be 2/2 undiagnosed gynecological malignancy. Hgb 6.8 today. Patient denies known bleeding today.  - OB-GYN consulted; patient will be scheduled to follow up OP for endometrial Bx at [facility] [Dr. Emeterio Reeve will message the office] if kidney function continues to improve - Patient has not been receiving Megestrol 40mg  BID regularly; encourage regular medication dosing as tolerated - Post-transfusion CBC and morning CBC this am stable ~8 - Will hold off on iron for now given GI inflammation though will likely require this vs. EPO in future  # Hypertension  Discontinued amlodipine given she is on Diltiazem.  - continue to monitor on liquid diltiazem   Prior to Admission Living Arrangement: Home  Anticipated Discharge Location: Home with HH PT, OT, ST, hopeful PCS, wheelchair and lift.  Will need to discuss further with daughter. Barriers to Discharge: Decreased PO intake; Vomiting  Code Status: DNR/DNI Diet: Dysphagia 2 diet without fluid restriction IVF: LR 46mL/hr DVT PPx: SCD's given vaginal bleeding  Jeralyn Bennett, MD 09/06/2020, 4:08 PM Pager: [phone] After 5pm on weekdays and 1pm on weekends: On Call pager [phone]

## 2020-09-06 NOTE — Progress Notes (Signed)
Patient vomited right after taking cardizem suspension, 100cc mixed with water.

## 2020-09-07 LAB — RENAL FUNCTION PANEL
Albumin: 1.9 g/dL — ABNORMAL LOW (ref 3.5–5.0)
Anion gap: 9 (ref 5–15)
BUN: 29 mg/dL — ABNORMAL HIGH (ref 6–20)
CO2: 13 mmol/L — ABNORMAL LOW (ref 22–32)
Calcium: 7.3 mg/dL — ABNORMAL LOW (ref 8.9–10.3)
Chloride: 113 mmol/L — ABNORMAL HIGH (ref 98–111)
Creatinine, Ser: 3 mg/dL — ABNORMAL HIGH (ref 0.44–1.00)
GFR calc Af Amer: 19 mL/min — ABNORMAL LOW (ref >60)
GFR calc non Af Amer: 17 mL/min — ABNORMAL LOW (ref >60)
Glucose, Bld: 99 mg/dL (ref 70–99)
Phosphorus: 2.9 mg/dL (ref 2.5–4.6)
Potassium: 4.3 mmol/L (ref 3.5–5.1)
Sodium: 135 mmol/L (ref 135–145)

## 2020-09-07 LAB — GLUCOSE, CAPILLARY
Glucose-Capillary: 81 mg/dL (ref 70–99)
Glucose-Capillary: 84 mg/dL (ref 70–99)
Glucose-Capillary: 86 mg/dL (ref 70–99)
Glucose-Capillary: 92 mg/dL (ref 70–99)

## 2020-09-07 LAB — CBC
HCT: 25.7 % — ABNORMAL LOW (ref 36.0–46.0)
Hemoglobin: 8.1 g/dL — ABNORMAL LOW (ref 12.0–15.0)
MCH: 29.3 pg (ref 26.0–34.0)
MCHC: 31.5 g/dL (ref 30.0–36.0)
MCV: 93.1 fL (ref 80.0–100.0)
Platelets: 308 10*3/uL (ref 150–400)
RBC: 2.76 MIL/uL — ABNORMAL LOW (ref 3.87–5.11)
RDW: 17.4 % — ABNORMAL HIGH (ref 11.5–15.5)
WBC: 8.6 10*3/uL (ref 4.0–10.5)
nRBC: 0 % (ref 0.0–0.2)

## 2020-09-07 LAB — MAGNESIUM: Magnesium: 2.1 mg/dL (ref 1.7–2.4)

## 2020-09-07 LAB — SODIUM, URINE, RANDOM: Sodium, Ur: 88 mmol/L

## 2020-09-07 NOTE — Progress Notes (Signed)
Patient refused to take morning PO meds and reported that she can not swallow. RN educated patient and will continue to monitor this patient.

## 2020-09-07 NOTE — Progress Notes (Signed)
Patient reported to RN that she does not want to take any more medications and refused to give a reason why, MD has been notified. RN educated this patient and will continue to monitor this patient

## 2020-09-07 NOTE — Progress Notes (Signed)
Subjective:  Patient was seen and evaluated at bedside on morning rounds. No vomiting today. She had soup this morning and tolerated that well. She refused most of her PO/liquid medications last night. Still has sore throat sometimes. Encouraged her to take sucralfate, she refuses that initially but then states that she will try later on. Denies further vaginal bleeding and states that diarrhea is better. No acute events overnight.  Objective:  Vital signs in last 24 hours: Vitals:   09/06/20 1032 09/06/20 1631 09/06/20 2018 09/07/20 0437  BP: (!) 150/88 (!) 173/105 (!) 146/88 (!) 153/83  Pulse: (!) 102 (!) 106 (!) 110 (!) 109  Resp: 16 18 18 18   Temp: 98.2 F (36.8 C) 98.3 F (36.8 C) 98.1 F (36.7 C) 98.1 F (36.7 C)  TempSrc: Oral Oral Oral Oral  SpO2: 100% 99% 100% 100%  Weight:   (!) 143.8 kg   Height:       General: Morbidly obese, no acute distress, Alert and oriented. HENT: Respiratory: Anterior lung sounds decreased but CTA, bilaterally. No tachypnea. Cardiovascular: Tachycardic, rate is regular. No murmurs Neurological: Patient has R-sided hemiparesis. She is alert and oriented. Abdominal: Soft. No distention, No tenderness Bowel sounds present.  Assessment/Plan:  Active Problems:   Rhabdomyolysis   Hypokalemia   Hypocalcemia   Acute renal failure (HCC)   Malnutrition of moderate degree   Palliative care by specialist   DNR (do not resuscitate) discussion   Lethargy   DNR (do not resuscitate)   Weakness generalized   Vaginal bleeding   Colitis   Chronic gastritis without bleeding   Morbid obesity ([place])   Hiatal hernia   Non-intractable vomiting   Regurgitation of food   Iron deficiency anemia due to chronic blood loss   # Improving Subacute Colitis # Chronic Duodenitis/Gastritis # Likely Esophageal Dysmotility Pt continous to have nausea and vomiting. GI has signed off EGD biopsies suggestive of chronic peptic duodenitis, chronic gastritis with  reactive esophagitis, and flex sigmoidoscopy consistent with likely acute, infectious, self-limited colitis. Repeat CT abdomen / pelvis 09/02/20 showed improved colitis that may be ischemic in nature with gastric inflammation. Patient's vomiting and diarrhea continue although improved and patient denies nausea or abdominal pain. Esophogram showed likely mid-distal esophageal dysmotility; most likely the cause for her continued vomiting. -Unfortunately patient continues not to take po meds due to vomiting. We have switched most of tablets to either solution or IV med. - Started IV metoclopramide 5 mg TID yesterday. It help w vomiting today. QTC today is borderline again. Will monitor QTC. -If any arrhythmia, will hold metoclopramide -Continue Sucralfate and liquid diltiazem (pt refuses to take them) -Continue PPI IV BID for 4 weeks then decrease to 1x daily for 8 weeks followed by EGD in 3 months to document healing, per GI. - Avoid NSAIDs - continue scopolamine patch - consider esophageal manometry as outpatient   # Improving AKI on likely Chronic Renal Failure stage IIIb with Low bicarb. Creatinine on arrival was 10.23 (up from 1.7 in 2019), BUN 73, GFR 4.  Improved gradually. Likely prerenal due to prolonged poor PO intake and diarrhea. Renal Ultrasound showed increased echogenicity consistent with medical renal disease. Patient appears euvolemic.   Her bicarb level is getting worse despite improvement of kidney function and diarrhea. not tolerating NaHCO3 PO.  Unclear why she continues to have low bicarb level.   -Checking urine Na and PH to evaluate for RTA. -May repeat VBG tomorrow and will consider re consulting nephro before  adding IV Na barcarb. - Continue LR 34mL/hr - Monitor Strict I&O - Continue to monitor daily renal function  Dispo plan:  -PT and OT have recommended SNF placement; however, patient has expressed desire to go home.  - CM currently able to set up [facility] OT, PT, ST, and  hopeful for PCS early next week for discharge home to family if this is in line with patient and family's wishes.  - DME orders placed for wheelchair and lift   # Electrolyte Abnormalities with Moderate Malnutrition Improved. Stable today. Replacing as needed.  # Stable Normocytic Anemia Likely due to vaginal bleeding and worsening CKD. Her nausea may be 2/2 undiagnosed gynecological malignancy. Hgb 6.8 today. Patient denies known bleeding today.  - OB-GYN consulted; patient will be scheduled to follow up OP for endometrial Bx at [facility] [Dr. Emeterio Reeve will message the office] if kidney function continues to improve - Patient has not been receiving Megestrol 40mg  BID regularly; encourage regular medication dosing as tolerated - Post-transfusion CBC and morning CBC this am stable ~8 - Will hold off on iron for now given GI inflammation though will likely require this vs. EPO in future  # Hypertension  Refuses PO meds. Discontinued amlodipine given she is on Diltiazem.  - continue to monitor on liquid diltiazem   Prior to Admission Living Arrangement: Home  Anticipated Discharge Location: Home with HH PT, OT, ST, hopeful PCS, wheelchair and lift. Will need to discuss further with daughter. Barriers to Discharge: Decreased PO intake; Vomiting  Code Status: DNR/DNI Diet: Dysphagia 2 diet without fluid restriction IVF: LR 16mL/hr DVT PPx: SCD's given vaginal bleeding  [name], MD 09/07/2020, 7:19 AM Pager: [phone] After 5pm on weekdays and 1pm on weekends: On Call pager [phone]

## 2020-09-08 DIAGNOSIS — N179 Acute kidney failure, unspecified: Secondary | ICD-10-CM | POA: Diagnosis not present

## 2020-09-08 DIAGNOSIS — R821 Myoglobinuria: Secondary | ICD-10-CM

## 2020-09-08 DIAGNOSIS — K2950 Unspecified chronic gastritis without bleeding: Secondary | ICD-10-CM | POA: Diagnosis not present

## 2020-09-08 DIAGNOSIS — M6282 Rhabdomyolysis: Secondary | ICD-10-CM | POA: Diagnosis not present

## 2020-09-08 DIAGNOSIS — K449 Diaphragmatic hernia without obstruction or gangrene: Secondary | ICD-10-CM | POA: Diagnosis not present

## 2020-09-08 LAB — RENAL FUNCTION PANEL
Albumin: 1.9 g/dL — ABNORMAL LOW (ref 3.5–5.0)
Anion gap: 10 (ref 5–15)
BUN: 27 mg/dL — ABNORMAL HIGH (ref 6–20)
CO2: 13 mmol/L — ABNORMAL LOW (ref 22–32)
Calcium: 7.3 mg/dL — ABNORMAL LOW (ref 8.9–10.3)
Chloride: 113 mmol/L — ABNORMAL HIGH (ref 98–111)
Creatinine, Ser: 2.94 mg/dL — ABNORMAL HIGH (ref 0.44–1.00)
GFR calc Af Amer: 20 mL/min — ABNORMAL LOW (ref >60)
GFR calc non Af Amer: 17 mL/min — ABNORMAL LOW (ref >60)
Glucose, Bld: 90 mg/dL (ref 70–99)
Phosphorus: 3.1 mg/dL (ref 2.5–4.6)
Potassium: 4.7 mmol/L (ref 3.5–5.1)
Sodium: 136 mmol/L (ref 135–145)

## 2020-09-08 LAB — CBC
HCT: 26.1 % — ABNORMAL LOW (ref 36.0–46.0)
Hemoglobin: 8.3 g/dL — ABNORMAL LOW (ref 12.0–15.0)
MCH: 29.7 pg (ref 26.0–34.0)
MCHC: 31.8 g/dL (ref 30.0–36.0)
MCV: 93.5 fL (ref 80.0–100.0)
Platelets: 307 10*3/uL (ref 150–400)
RBC: 2.79 MIL/uL — ABNORMAL LOW (ref 3.87–5.11)
RDW: 17.2 % — ABNORMAL HIGH (ref 11.5–15.5)
WBC: 8.1 10*3/uL (ref 4.0–10.5)
nRBC: 0 % (ref 0.0–0.2)

## 2020-09-08 LAB — HEPATIC FUNCTION PANEL
ALT: 25 U/L (ref 0–44)
AST: 25 U/L (ref 15–41)
Albumin: 2 g/dL — ABNORMAL LOW (ref 3.5–5.0)
Alkaline Phosphatase: 80 U/L (ref 38–126)
Bilirubin, Direct: <0.1 mg/dL (ref 0.0–0.2)
Total Bilirubin: 0.9 mg/dL (ref 0.3–1.2)
Total Protein: 5.4 g/dL — ABNORMAL LOW (ref 6.5–8.1)

## 2020-09-08 LAB — URINALYSIS, ROUTINE W REFLEX MICROSCOPIC
Bilirubin Urine: NEGATIVE
Glucose, UA: NEGATIVE mg/dL
Ketones, ur: 5 mg/dL — AB
Nitrite: NEGATIVE
Protein, ur: 100 mg/dL — AB
Specific Gravity, Urine: 1.009 (ref 1.005–1.030)
pH: 7 (ref 5.0–8.0)

## 2020-09-08 LAB — CHLORIDE, URINE, RANDOM: Chloride Urine: 69 mmol/L

## 2020-09-08 LAB — BLOOD GAS, VENOUS
Acid-base deficit: 12.1 mmol/L — ABNORMAL HIGH (ref 0.0–2.0)
Bicarbonate: 12.4 mmol/L — ABNORMAL LOW (ref 20.0–28.0)
FIO2: 21
O2 Saturation: 75.9 %
Patient temperature: 36.5
pCO2, Ven: 21.9 mmHg — ABNORMAL LOW (ref 44.0–60.0)
pH, Ven: 7.367 (ref 7.250–7.430)
pO2, Ven: 38.9 mmHg (ref 32.0–45.0)

## 2020-09-08 LAB — NA AND K (SODIUM & POTASSIUM), RAND UR
Potassium Urine: 11 mmol/L
Sodium, Ur: 99 mmol/L

## 2020-09-08 LAB — GLUCOSE, CAPILLARY
Glucose-Capillary: 83 mg/dL (ref 70–99)
Glucose-Capillary: 88 mg/dL (ref 70–99)

## 2020-09-08 LAB — CK: Total CK: 105 U/L (ref 38–234)

## 2020-09-08 LAB — MAGNESIUM: Magnesium: 2 mg/dL (ref 1.7–2.4)

## 2020-09-08 NOTE — TOC Progression Note (Signed)
Transition of Care [facility]) - Progression Note    Patient Details  Name: Cristina Jordan MRN: [id number] Date of Birth: Apr 24, 1961  Transition of Care [facility]) CM/SW Contact  Bartholomew Crews, RN Phone Number: [phone] 09/08/2020, 2:04 PM  Clinical Narrative:     Received call from [place] at [facility]. [facility] has accepted PCS referral and will begin services within 5 days of patient's transition home.   [facility] to provide [facility] RN, PT, OT, ST and outpatient palliative services. HH orders and Face to Face provided.   Spoke with [place] at [facility]. [employer] is working on order for bariatric wheelchair and hoyer lift to be delivered to the home.   Spoke with MD about readiness to transition home. Pending PO intake possibly Thursday or Friday.   Spoke with patient's daughter, [name], on the phone to provide update on plans for transition home.   Patient will need ambulance transportation home through [facility].   TOC following for transition needs.   Expected Discharge Plan: [place] Barriers to Discharge: Continued Medical Work up  Expected Discharge Plan and Services Expected Discharge Plan: [place] arrangements for the past 2 months: Single Family Home                                       Social Determinants of Health (SDOH) Interventions    Readmission Risk Interventions No flowsheet data found.

## 2020-09-08 NOTE — Progress Notes (Signed)
Subjective:   Ms. Tompkins states that she is feeling about the same as she has been. She notes that she refused to take her IV in addition to oral medications, because she believed the medications made her fatigued and she feels that pills get stuck in her throat. She does continue to endorse mild throat pain and a bit of nausea and states she vomited yesterday, but denies significant vomiting today. She says her diarrhea continue to improve.   Objective:  Vital signs in last 24 hours: Vitals:   09/07/20 1656 09/07/20 2212 09/08/20 0453 09/08/20 0910  BP: (!) 164/102 (!) 156/102 (!) 160/111 (!) 171/104  Pulse: (!) 107 (!) 110 (!) 109 (!) 110  Resp: 16 18 17 18   Temp: 98.3 F (36.8 C) 98.9 F (37.2 C) 97.7 F (36.5 C) 97.8 F (36.6 C)  TempSrc:   Oral Oral  SpO2: 100% 100% 100% 100%  Weight:  (!) 139.3 kg    Height:       General: Patient is morbidly obese. She is resting comfortably in no acute distress. HENT: Mucus membranes are moist. Edentulous.  Respiratory: Anterior lung sounds decreased but CTA, bilaterally. No tachypnea. Cardiovascular: Rate is tachycardic. Rhythm is regular. No murmurs, rubs, or gallops. Soft heart sounds.  Neurological: Patient has R-sided hemiparesis. She is alert and oriented. Abdominal: Soft without TTP, guarding, or rebound. No distention. Bowel sounds quiet. Genitourinary: Foley is in place, draining urine. Skin: Warm and dry.   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 Repeat CT abdomen / pelvis 09/02/20 showed improved colitis that may be ischemic in nature with gastric inflammation. Patient's vomiting and diarrhea continue although improved and patient denies nausea or abdominal pain. Esophogram showed likely mid-distal esophageal dysmotility; most likely the cause for her continued vomiting. - GI have signed off given improvement - Continue PPI IV BID for 4 weeks then decrease to 1x daily for 8 weeks followed by EGD in 3 months to document healing, per GI. - Avoid NSAIDs - Patient is agreeable to taking Diltiazem PO and Metoclopramide IV to try to improve her vomiting  - Patient declining throat spray and sucralfate and any pill medications currently - continue scopolamine patch for nausea - Hold imodium per GI recs - Continue to encourage PO intake; encouraged patient to order food off menu (dysphagia 2 diet) - Will repeat EKG tomorrow morning s/p metoclopramide - consider esophageal manometry as outpatient   # Improving AKI on likely Chronic Renal Failure stage IIIb with Worsening NAGMA  Creatinine on arrival was 10.23 (up from 1.7 in 2019), BUN 73, GFR 4. Creatinine and BUN stabilizing ~ 3 and 29, respectively . Likely prerenal due to prolonged poor PO intake and diarrhea. Renal Ultrasound showed increased echogenicity consistent with medical renal disease.  Patient appears euvolemic. NAGMA slightly worsening, although patient was able to take oral NaHCO3 this morning. Creatinine and BUN continue to improve. - Nephrology signed off given stability/improvement; however, patient is a poor dialysis candidate. PT and OT have recommended SNF placement; however, patient has expressed desire to go  home.  - HH OT, PT, SLP, and RN ordered, and hopeful for PCS early this week for discharge home to family if this is in line with patient and family's wishes.  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 Foley Irritation U/A cloudy with moderate leukocytes and rare bacteria.  - Will discuss urinary symptoms - If possible consider ABX treatment   # Myoglobinuria U/A shows moderate hemoglobin with 0-5 RBCs. Patient had mild rhabdomyolysis on initial workup.  - Will recheck CK  # Electrolyte Abnormalities with Moderate Malnutrition Patient has only been consuming broth for at least 2 weeks at home with N/V/D. Nutrition endorses 22% weight loss over past 3 months.  Potassium on arrival was less than 2, corrected calcium was 6.8, magnesium was 1.4 likely secondary to poor p.o. intake, nausea vomiting and diarrhea. Prolonged QTc. IJ tunnel cath placed 8/54/62 without complication. - All electrolytes table this morning - Albumin low, encouraged PO intake as tolerated  # Stable Normocytic Anemia Likely due to vaginal bleeding and worsening CKD. Her nausea may be 2/2 undiagnosed gynecological malignancy. Hbg now stable s/p transfusions. Patient denies known bleeding today.  - OB-GYN consulted; patient will be scheduled to follow up OP for endometrial Bx at [facility] [Dr. Emeterio Reeve will message the office] if kidney function continues to improve - Patient has not been receiving Megestrol 40mg  BID regularly; will encourage regular dosing once able to tolerate  - Post-transfusion CBC and morning CBC this am stable ~8 - Will hold off on iron for now given GI inflammation though will likely require  this vs. EPO in future   # Hypertension  Discontinued amlodipine given she is on Diltiazem.  - blood pressures remain elevated - If diltiazem does not improve dysmotility / vomiting, will restart amlodipine   Prior to Admission Living Arrangement: Home  Anticipated Discharge Location: Home with HH PT, OT, SLP, RN hopeful PCS, wheelchair and lift. Will need to discuss further with daughter. Barriers to Discharge: Decreased PO intake; Vomiting  Code Status: DNR/DNI Diet: Dysphagia 2 diet without fluid restriction IVF: LR 70mL/hr DVT PPx: SCD's given vaginal bleeding  Jeralyn Bennett, MD 09/08/2020, 2:13 PM Pager: [phone] After 5pm on weekdays and 1pm on weekends: On Call pager [phone]

## 2020-09-08 NOTE — Plan of Care (Signed)
  Problem: Education: Goal: Knowledge of General Education information will improve Description: Including pain rating scale, medication(s)/side effects and non-pharmacologic comfort measures Outcome: Progressing   Problem: Activity: Goal: Risk for activity intolerance will decrease Outcome: Progressing   Problem: Nutrition: Goal: Adequate nutrition will be maintained Outcome: Progressing   

## 2020-09-09 DIAGNOSIS — K2950 Unspecified chronic gastritis without bleeding: Secondary | ICD-10-CM | POA: Diagnosis not present

## 2020-09-09 DIAGNOSIS — N179 Acute kidney failure, unspecified: Secondary | ICD-10-CM | POA: Diagnosis not present

## 2020-09-09 DIAGNOSIS — M6282 Rhabdomyolysis: Secondary | ICD-10-CM | POA: Diagnosis not present

## 2020-09-09 DIAGNOSIS — K449 Diaphragmatic hernia without obstruction or gangrene: Secondary | ICD-10-CM | POA: Diagnosis not present

## 2020-09-09 LAB — CBC
HCT: 25 % — ABNORMAL LOW (ref 36.0–46.0)
Hemoglobin: 8.1 g/dL — ABNORMAL LOW (ref 12.0–15.0)
MCH: 29.8 pg (ref 26.0–34.0)
MCHC: 32.4 g/dL (ref 30.0–36.0)
MCV: 91.9 fL (ref 80.0–100.0)
Platelets: 310 10*3/uL (ref 150–400)
RBC: 2.72 MIL/uL — ABNORMAL LOW (ref 3.87–5.11)
RDW: 17.2 % — ABNORMAL HIGH (ref 11.5–15.5)
WBC: 9.4 10*3/uL (ref 4.0–10.5)
nRBC: 0 % (ref 0.0–0.2)

## 2020-09-09 LAB — RENAL FUNCTION PANEL
Albumin: 2 g/dL — ABNORMAL LOW (ref 3.5–5.0)
Anion gap: 11 (ref 5–15)
BUN: 32 mg/dL — ABNORMAL HIGH (ref 6–20)
CO2: 12 mmol/L — ABNORMAL LOW (ref 22–32)
Calcium: 7.5 mg/dL — ABNORMAL LOW (ref 8.9–10.3)
Chloride: 112 mmol/L — ABNORMAL HIGH (ref 98–111)
Creatinine, Ser: 3.05 mg/dL — ABNORMAL HIGH (ref 0.44–1.00)
GFR calc Af Amer: 19 mL/min — ABNORMAL LOW (ref >60)
GFR calc non Af Amer: 16 mL/min — ABNORMAL LOW (ref >60)
Glucose, Bld: 84 mg/dL (ref 70–99)
Phosphorus: 3.4 mg/dL (ref 2.5–4.6)
Potassium: 4.8 mmol/L (ref 3.5–5.1)
Sodium: 135 mmol/L (ref 135–145)

## 2020-09-09 LAB — GLUCOSE, CAPILLARY
Glucose-Capillary: 75 mg/dL (ref 70–99)
Glucose-Capillary: 81 mg/dL (ref 70–99)
Glucose-Capillary: 83 mg/dL (ref 70–99)

## 2020-09-09 LAB — MAGNESIUM: Magnesium: 1.8 mg/dL (ref 1.7–2.4)

## 2020-09-09 NOTE — Progress Notes (Signed)
Subjective:   Today, Cristina Jordan states that she is feeling very tired. She continues to feel weak, although she was excited that she was able to sit on the edge of her bed yesterday. She did not take her morning PO medications, stating she wanted to speak with a doctor first. She says she is agreeable to try diltiazem with her IV medications after discussion. She says she experienced one episode of small volume vomiting this morning but denies any nausea. She does say she is nauseas without the scopolamine patch. She says she is hardly having diarrhea now and her pain at her foley site has improved.    Objective:  Vital signs in last 24 hours: Vitals:   09/08/20 1636 09/08/20 2038 09/09/20 0513 09/09/20 0916  BP: (!) 154/97 (!) 153/90 (!) 154/101 (!) 154/98  Pulse: (!) 105 (!) 110 (!) 101 98  Resp: 18 20 18 20   Temp: 97.7 F (36.5 C) 97.9 F (36.6 C) 97.8 F (36.6 C) 98.1 F (36.7 C)  TempSrc: Oral Oral  Oral  SpO2: 100% 100% 100% 100%  Weight:      Height:       General: Patient is morbidly obese. She is appears uncomfortable but is in no acute distress.  HENT: Mucus membranes are moist. Edentulous.  Respiratory: Anterior lung sounds decreased but CTA, bilaterally. Patient is tachypneic with slightly increased work of breathing.  Cardiovascular: Rate is tachycardic. Rhythm is regular. No murmurs, rubs, or gallops.  Neurological: Patient has R-sided hemiparesis. Chronic slurred speech. She appears lethargic.  Abdominal: Soft without TTP, guarding, or rebound. No distention. Bowel sounds quiet. Genitourinary: Foley is in place, draining urine. Skin: Warm and dry.   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 Repeat CT abdomen / pelvis 09/02/20 showed improved colitis that may be ischemic in nature with gastric inflammation. Patient's vomiting and diarrhea continue although improved and patient denies nausea or abdominal pain. Esophogram showed likely mid-distal esophageal dysmotility; most likely the cause for her continued vomiting. - GI have signed off given improvement - Continue PPI IV BID for 4 weeks then decrease to 1x daily for 8 weeks followed by EGD in 3 months to document healing, per GI. - Avoid NSAIDs - Patient is agreeable to taking Diltiazem PO and Metoclopramide IV to try to improve her vomiting  - Patient declining throat spray and sucralfate and any pill medications currently - continue scopolamine patch for nausea - Hold imodium per GI recs - Continue to encourage PO intake; encouraged patient to order food off menu (dysphagia 2 diet); slowly tolerating PO better - check repeat EKG given QTc prolongation - consider esophageal manometry as outpatient   # Improving AKI on likely Chronic Renal Failure stage IIIb with Worsening NAGMA  Creatinine on arrival  was 10.23 (up from 1.7 in 2019), BUN 73, GFR 4. Creatinine and BUN stabilizing ~ 3, BUN improving. Likely prerenal due to prolonged poor PO intake and diarrhea. Renal Ultrasound showed increased echogenicity consistent with medical renal disease. Patient appears euvolemic. NAGMA slightly worsening, although patient was able to take oral NaHCO3 recently.  - Nephrology  signed off given stability/improvement; however, patient is a poor dialysis candidate. PT and OT have recommended SNF placement; however, patient has expressed desire to go home.  - HH OT, PT, SLP, and RN ordered, and hopeful for PCS early this week for discharge home to family if this is in line with patient and family's wishes.  - DME orders placed for wheelchair and lift - Continue LR 18mL/hr - Monitor Strict I&O - Continue to monitor daily renal function  # Chronic Non-Anion Gap Metabolic Acidosis with Respiratory Compensation  VBG showed pH 7.367, pCO2 21.9, pO2 38.9, bicarb 12.4. anion gap 11. Urine anion gap of 41. Urine pH 7.0. - Lack of anion gap consistent with intact kidney function, ruling out RTA - Will consider discharging with NaHCO3 if patient able to tolerate PO   # Myoglobinuria U/A shows moderate hemoglobin with 0-5 RBCs. Patient had mild rhabdomyolysis on initial workup.  - CK pending   # Electrolyte Abnormalities with Moderate Malnutrition Patient has only been consuming broth for at least 2 weeks at home with N/V/D. Nutrition endorses 22% weight loss over past 3 months.  Potassium on arrival was less than 2, corrected calcium was 6.8, magnesium was 1.4 likely secondary to poor p.o. intake, nausea vomiting and diarrhea. Prolonged QTc. IJ tunnel cath placed 4/82/50 without complication. - All electrolytes table this morning - Albumin low, encouraged PO intake as tolerated  # Stable Normocytic Anemia Likely due to vaginal bleeding and worsening CKD. Her nausea may be 2/2 undiagnosed gynecological malignancy. Hbg now stable s/p transfusions. Patient denies known bleeding today.  - OB-GYN consulted; patient will be scheduled to follow up OP for endometrial Bx at [place] [Dr. Emeterio Reeve will message the office] if kidney function continues to improve - Patient has not been receiving Megestrol 40mg  BID regularly; will encourage regular dosing once able to tolerate   - Hemoglobin stable ~8; daily CBC's - Will hold off on iron for now given GI inflammation though will likely require this vs. EPO in future   # Hypertension  Discontinued amlodipine given she is on Diltiazem.  - blood pressures remain elevated - If diltiazem does not improve dysmotility / vomiting, will restart amlodipine   Prior to Admission Living Arrangement: Home  Anticipated Discharge Location: Home with HH PT, OT, SLP, RN hopeful PCS, wheelchair and lift. Will need to discuss further with daughter. Barriers to Discharge: Decreased PO intake; Vomiting  Code Status: DNR/DNI Diet: Dysphagia 2 diet without fluid restriction IVF: LR 73mL/hr DVT PPx: SCD's given vaginal bleeding  Jeralyn Bennett, MD 09/09/2020, 3:33 PM Pager: [phone] After 5pm on weekdays and 1pm on weekends: On Call pager [phone]

## 2020-09-09 NOTE — Progress Notes (Signed)
Pt has refused to take any of her meds so far today, student nurse has attempted numerous times to get her to take her meds.  Pt states "I want to see the doctor first, I want to talk to him about how many meds I'm taking, I don't want to take any til I talk with them".  Dr Konrad Penta notified of the above, doctors will be coming up to see her.

## 2020-09-09 NOTE — Plan of Care (Signed)
  Problem: Education: Goal: Knowledge of General Education information will improve Description: Including pain rating scale, medication(s)/side effects and non-pharmacologic comfort measures Outcome: Completed/Met

## 2020-09-09 NOTE — Progress Notes (Signed)
Physical Therapy Treatment Patient Details Name: Cristina Jordan MRN: [id number] DOB: [birth year] Today's Date: 09/09/2020    History of Present Illness 59 year old woman with h/o CVA, WC bound, presenting with worsening weakness.  She also has nausea, vomiting, decreased PO intake, abdominal pain, diarrhea X 1 week and weak to the point of not being able to get out of bed.  She was found to have colitis, Severe AKI, and electrolyte abnormalities.      PT Comments    Continuing work on functional mobility and activity tolerance;  Made modest progress today, tolerated sitting EOB for 8-10 minutes with mod assist to keep balance -- pt herself requested to sit up on EOB; Noting the plan is for dc to home, and I agree with maximizing equipment and services for home   Follow Up Recommendations  SNF;Supervision/Assistance - 24 hour Still recommending post-acute inpatient rehab, however of course noted the plan for dc home   Equipment Recommendations  Other (comment) (if going home will need hospital bed and hoyer lift)Ambulance ride   Recommendations for Other Services       Precautions / Restrictions Precautions Precautions: Fall Precaution Comments: baseline R-sided hemiplegia Restrictions Other Position/Activity Restrictions: flaccid RUE/RLE    Mobility  Bed Mobility Overal bed mobility: Needs Assistance Bed Mobility: Supine to Sit;Sit to Supine     Supine to sit: +2 for physical assistance;Max assist (3rd person present for safety) Sit to supine: Total assist;+2 for physical assistance (with 3rd person assist as well)   General bed mobility comments: Cues for technique; Max assist to move RLE off of teh bed and mod assist for LLE; Max assist of 2 and third person for safety to elelvate trunk to sit and assist feet to floor in semi-helicopter style movement; Tending to R lean initially; Total A for getting back to supine  Transfers                    Ambulation/Gait                  Stairs             Wheelchair Mobility    Modified Rankin (Stroke Patients Only)       Balance     Sitting balance-Leahy Scale: Poor Sitting balance - Comments: Sat EOB for approx 8-10 minutes; Tending to R lean; encouraged to reach L for foot board; attempted, however pt unable to keep hold of the foot board; noted shallow respirations while sitting up                                    Cognition Arousal/Alertness: Awake/alert Behavior During Therapy: Flat affect Overall Cognitive Status: No family/caregiver present to determine baseline cognitive functioning                                 General Comments: follows simple commands, decreased awareness of deficits and need for assistance.       Exercises      General Comments General comments (skin integrity, edema, etc.): Session conducted on Room Air and O2 sats were 99-100% at end of session; RN notified      Pertinent Vitals/Pain Pain Assessment: No/denies pain    Home Living  Prior Function            PT Goals (current goals can now be found in the care plan section) Acute Rehab PT Goals Patient Stated Goal: Requested to sit up on EOB PT Goal Formulation: With patient Time For Goal Achievement: 09/08/20 Potential to Achieve Goals: Good Progress towards PT goals: Progressing toward goals    Frequency    Min 2X/week      PT Plan Current plan remains appropriate    Co-evaluation              AM-PAC PT "6 Clicks" Mobility   Outcome Measure  Help needed turning from your back to your side while in a flat bed without using bedrails?: Total Help needed moving from lying on your back to sitting on the side of a flat bed without using bedrails?: Total Help needed moving to and from a bed to a chair (including a wheelchair)?: Total Help needed standing up from a chair using your arms (e.g., wheelchair or bedside  chair)?: Total Help needed to walk in hospital room?: Total Help needed climbing 3-5 steps with a railing? : Total 6 Click Score: 6    End of Session   Activity Tolerance: Patient tolerated treatment well Patient left: in bed;with call bell/phone within reach Nurse Communication: Mobility status PT Visit Diagnosis: Other abnormalities of gait and mobility (R26.89);Hemiplegia and hemiparesis Hemiplegia - Right/Left: Right Hemiplegia - caused by: Cerebral infarction     Time: 1104-1140 PT Time Calculation (min) (ACUTE ONLY): 36 min  Charges:  $Therapeutic Activity: 23-37 mins                     Roney Marion, PT  Acute Rehabilitation Services Pager [phone] [facility] [place] 09/09/2020, 1:56 PM

## 2020-09-10 DIAGNOSIS — N179 Acute kidney failure, unspecified: Secondary | ICD-10-CM | POA: Diagnosis not present

## 2020-09-10 DIAGNOSIS — M6282 Rhabdomyolysis: Secondary | ICD-10-CM | POA: Diagnosis not present

## 2020-09-10 DIAGNOSIS — K449 Diaphragmatic hernia without obstruction or gangrene: Secondary | ICD-10-CM | POA: Diagnosis not present

## 2020-09-10 DIAGNOSIS — K2950 Unspecified chronic gastritis without bleeding: Secondary | ICD-10-CM | POA: Diagnosis not present

## 2020-09-10 LAB — BLOOD GAS, VENOUS
Acid-base deficit: 11.4 mmol/L — ABNORMAL HIGH (ref 0.0–2.0)
Bicarbonate: 13.1 mmol/L — ABNORMAL LOW (ref 20.0–28.0)
FIO2: 21
O2 Saturation: 75.6 %
Patient temperature: 37
pCO2, Ven: 24.2 mmHg — ABNORMAL LOW (ref 44.0–60.0)
pH, Ven: 7.353 (ref 7.250–7.430)
pO2, Ven: 40.8 mmHg (ref 32.0–45.0)

## 2020-09-10 LAB — RENAL FUNCTION PANEL
Albumin: 2 g/dL — ABNORMAL LOW (ref 3.5–5.0)
Anion gap: 12 (ref 5–15)
BUN: 32 mg/dL — ABNORMAL HIGH (ref 6–20)
CO2: 12 mmol/L — ABNORMAL LOW (ref 22–32)
Calcium: 7.5 mg/dL — ABNORMAL LOW (ref 8.9–10.3)
Chloride: 112 mmol/L — ABNORMAL HIGH (ref 98–111)
Creatinine, Ser: 3.2 mg/dL — ABNORMAL HIGH (ref 0.44–1.00)
GFR calc Af Amer: 18 mL/min — ABNORMAL LOW (ref >60)
GFR calc non Af Amer: 15 mL/min — ABNORMAL LOW (ref >60)
Glucose, Bld: 95 mg/dL (ref 70–99)
Phosphorus: 3.4 mg/dL (ref 2.5–4.6)
Potassium: 5 mmol/L (ref 3.5–5.1)
Sodium: 136 mmol/L (ref 135–145)

## 2020-09-10 LAB — CBC
HCT: 24.6 % — ABNORMAL LOW (ref 36.0–46.0)
Hemoglobin: 7.9 g/dL — ABNORMAL LOW (ref 12.0–15.0)
MCH: 29.4 pg (ref 26.0–34.0)
MCHC: 32.1 g/dL (ref 30.0–36.0)
MCV: 91.4 fL (ref 80.0–100.0)
Platelets: 287 10*3/uL (ref 150–400)
RBC: 2.69 MIL/uL — ABNORMAL LOW (ref 3.87–5.11)
RDW: 17.2 % — ABNORMAL HIGH (ref 11.5–15.5)
WBC: 8.6 10*3/uL (ref 4.0–10.5)
nRBC: 0 % (ref 0.0–0.2)

## 2020-09-10 LAB — GLUCOSE, CAPILLARY
Glucose-Capillary: 65 mg/dL — ABNORMAL LOW (ref 70–99)
Glucose-Capillary: 124 mg/dL — ABNORMAL HIGH (ref 70–99)
Glucose-Capillary: 74 mg/dL (ref 70–99)
Glucose-Capillary: 79 mg/dL (ref 70–99)
Glucose-Capillary: 97 mg/dL (ref 70–99)

## 2020-09-10 LAB — MAGNESIUM: Magnesium: 1.6 mg/dL — ABNORMAL LOW (ref 1.7–2.4)

## 2020-09-10 MED ORDER — SODIUM BICARBONATE 8.4 % IV SOLN
INTRAVENOUS | Status: AC
  Filled 2020-09-10: qty 100

## 2020-09-10 MED ORDER — PROMETHAZINE HCL 25 MG/ML IJ SOLN
6.2500 mg | Freq: Once | INTRAMUSCULAR | Status: AC
  Administered 2020-09-10: 6.25 mg via INTRAVENOUS
  Filled 2020-09-10: qty 1

## 2020-09-10 MED ORDER — METOCLOPRAMIDE HCL 5 MG/ML IJ SOLN
5.0000 mg | Freq: Three times a day (TID) | INTRAMUSCULAR | Status: DC
  Administered 2020-09-10 – 2020-09-12 (×6): 5 mg via INTRAVENOUS
  Filled 2020-09-10 (×6): qty 2

## 2020-09-10 MED ORDER — DEXTROSE 50 % IV SOLN: 12.5000 g | INTRAVENOUS | Status: AC

## 2020-09-10 MED ORDER — MAGNESIUM SULFATE 2 GM/50ML IV SOLN
2.0000 g | Freq: Once | INTRAVENOUS | Status: AC
  Administered 2020-09-10: 2 g via INTRAVENOUS
  Filled 2020-09-10: qty 50

## 2020-09-10 MED ORDER — DEXTROSE 50 % IV SOLN
INTRAVENOUS | Status: AC
  Administered 2020-09-10: 25 mL
  Filled 2020-09-10: qty 50

## 2020-09-10 NOTE — Progress Notes (Signed)
Patient reported to RN that she is still experiencing nausea, the patient did vomit 353mL of thin yellow liquid around 7am and did not eat any of her breakfast this morning. RN recommend PRN emetrol for nausea and the patient refused, the patient was educated and will continue to be monitored. MD has been notified, currently waiting for new orders.

## 2020-09-10 NOTE — Progress Notes (Signed)
Nutrition Follow-up  DOCUMENTATION CODES:   Non-severe (moderate) malnutrition in context of chronic illness  INTERVENTION:  -Carnation Instant Breakfast TID -Continue Magic Cup TID  Pt continues to have poor PO intake. Recommend short-term use of enteral nutrition via Cortrak/NG tube. Consider: -Osmolite 1.5 cal @ 40m/hr, advance 132mhr Q4H until goal rate of 6072mr is reached -7m14mosource TF daily  At goal, recommended tube feeding would provide 2200 kcals, 101 grams of protein, 1097ml9me water   NUTRITION DIAGNOSIS:   Moderate Malnutrition related to chronic illness (chronic renal insufficiency) as evidenced by percent weight loss, mild muscle depletion, energy intake < 75% for > or equal to 1 month (22% weight loss x 3 months).  Ongoing  GOAL:   Patient will meet greater than or equal to 90% of their needs  Not met  MONITOR:   PO intake, Supplement acceptance, Diet advancement, Skin, Labs  REASON FOR ASSESSMENT:   Malnutrition Screening Tool    ASSESSMENT:   58 yo31emale admitted with severe AKI, hypokalemia, hypocalcemia, hypomagnesemia. PMH includes CVA with residual R sided paralysis, wheelchair bound, HTN, chronic renal insufficiency.  9/10 s/p EGD and flexible sigmoidoscopy; EGD showed a tortuous esophagus, 4 cm HH, nonbleeding duodenal ulcers; Biopsies showed chronic gastritis and duodenitis.  9/23 Ba esophagram showed tertiary contractions in the mid and distal esophagus consistent with esophageal dysmotility, no stricture  Pt desires to go home, but likely requires more significant assistance than family/HH can provide. PT/OT recommending SNF.   Pt has been refusing to take po medications due to throat pain and feeling as though the pills get stuck in her throat.   Pt with very poor PO intake; however, this is noted to be slowly improving per MD. Pt would likely benefit from short-term initiation of enteral nutrition via postpyloric Cortrak/NG  tube. Discussed with MD. Pt is noted to be drinking well; will order Carnation Instant Breakfast TID to provide additional kcals/protein. Pt previously refused Ensure, Boost Breeze, and Prosource. Pt currently receiving Magic Cup with all meals.   300ml 54mis documented this AM  Labs: Mg 1.6 (L) Medications: Megace, Reglan, Protonix, Sodium Bicarbonate, Carafate  Diet Order:   Diet Order            DIET DYS 2 Room service appropriate? No; Fluid consistency: Thin  Diet effective now                 EDUCATION NEEDS:   Not appropriate for education at this time  Skin:  Skin Assessment: Reviewed RN Assessment  Last BM:  9/28 type 6  Height:   Ht Readings from Last 1 Encounters:  09/02/2020 5' 6.5" (1.689 m)    Weight:   Wt Readings from Last 1 Encounters:  09/07/20 (!) 139.3 kg    Ideal Body Weight:  60.2 kg  BMI:  Body mass index is 48.81 kg/m.  Estimated Nutritional Needs:   Kcal:  1900-2200  Protein:  100-130 gm  Fluid:  1.9-2.2 L    AmandaLarkin InaRD, LDN RD pager number and weekend/on-call pager number located in [email or web address]

## 2020-09-10 NOTE — Progress Notes (Signed)
Pt did not eat breakfast this morning and has been complaining of nausea. Refuses to take oral medications because of the nausea.

## 2020-09-10 NOTE — Progress Notes (Signed)
Hypoglycemic Event  CBG: 65  Treatment: D50 25 mL (12.5 gm)  Symptoms: None  Follow-up CBG: Time:0130 CBG Result:124  Possible Reasons for Event: Inadequate meal intake  Comments/MD notified:Per hypoglycemia protocol    Cristina Jordan Cristina Jordan

## 2020-09-10 NOTE — Progress Notes (Signed)
Subjective:   Patient had a total of 3 episodes of emesis this morning, roughly 300cc's of yellow fluid and does state she is nauseas today. Although she noted previously that scopolamine improved her nausea, she declines this today, although she just had her patch replaced this morning. She also complains of throat pain and diffuse abdominal pain that have remained constant, unchanged. We discussed that she may benefit from IV sodium bicarbonate addition and she is amenable to this. She says her diarrhea has resolved.   Objective:  Vital signs in last 24 hours: Vitals:   09/10/20 0459 09/10/20 0930 09/10/20 1050 09/10/20 1637  BP: (!) 152/101 (!) 157/99 (!) 143/96 136/68  Pulse: 99 100 100 91  Resp: 18 18 (!) 22 20  Temp: 97.9 F (36.6 C) 98 F (36.7 C) 98.7 F (37.1 C) 98 F (36.7 C)  TempSrc:  Oral Oral Oral  SpO2: 100% 100% 100% 100%  Weight:      Height:       General: Patient is morbidly obese. She appears tired and uncomfortable but in no acute distress.  HENT: Mucus membranes are moist. Edentulous.  Respiratory: Anterior lung sounds decreased but CTA, bilaterally. Patient is tachypneic without increased work of breathing.  Cardiovascular: Rate is tachycardic. Rhythm is regular. No murmurs, rubs, or gallops.  Neurological: Patient has R-sided hemiparesis. Chronic slurred speech. Patient is alert.  Abdominal: Soft without TTP, guarding, or rebound. No distention. Bowel sounds quiet. Genitourinary: Foley is in place, draining urine. Skin: Warm and dry.   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 Repeat CT abdomen / pelvis 09/02/20 showed improved colitis that may be ischemic in nature with gastric inflammation. Patient's nausea and vomiting continue, with diffuse abdominal pain and throat pain but resolved diarrhea. Esophogram showed likely mid-distal esophageal dysmotility; most likely the cause for her continued symptoms in addition to duodenitis, gastritis, and esophagitis.  - GI have signed off given improvement - Continue PPI IV BID for 4 weeks then decrease to 1x daily for 8 weeks followed by EGD in 3 months to document healing, per GI. - Avoid NSAIDs - Patient previously agreeable to taking Diltiazem PO in addition to IV medications; although now refusing due to vomiting  - Continue metoclopramide IV  - EKG today showed improved QTc 458. Will trial Phenergan 6.25mg  once  - Consider Phenergan if improves, vs. 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 Creatinine and BUN stabilizing ~ 3, BUN improved. AKI likely prerenal due to prolonged poor PO intake and diarrhea. Renal Ultrasound showed increased echogenicity consistent with medical renal disease. Patient appears euvolemic. VBG showed pH 7.367, pCO2 21.9, pO2 38.9, bicarb  12.4. anion gap 11. Urine anion gap of 41. Urine pH 7.0.Unable to tolerate PO medications currently. - Nephrology signed off given stability/improvement; however, patient is a poor dialysis candidate. PT and OT have recommended SNF placement; however, patient has expressed desire to go home.  - HH OT, PT, SLP, and RN ordered, and hopeful for PCS early this week for discharge home to family if this is in line with patient and family's wishes - DME orders placed for wheelchair and lift - Will start IV NaHCO3 185mL/hr (1L x 3) with plan to switch to oral once HCO3 improves to ~18 if patient able to tolerate PO intake  - Discontinued LR given incompatibility with NaHCO3  - Monitor Strict I&O and consider starting NS if needed - Continue to monitor daily renal function  # Electrolyte Abnormalities with Moderate Malnutrition Patient has only been consuming broth for at least 2 weeks at home with N/V/D. Nutrition endorses 22% weight loss over past 3 months.  Potassium on arrival was less than 2, corrected calcium was 6.8, magnesium was 1.4 likely secondary to poor p.o. intake, nausea vomiting and diarrhea. Prolonged QTc. IJ tunnel cath placed 5/32/02 without complication. - Replaced magnesium this morning - Potassium 5.0, repeating midnight BMP  - Albumin low, encouraged PO intake as tolerated - Daily monitoring   # Stable Normocytic Anemia Likely due to vaginal bleeding and worsening CKD. Her nausea may be 2/2 undiagnosed gynecological malignancy. Hbg now stable s/p transfusions. Patient denies known bleeding today.  - OB-GYN consulted; patient will be scheduled to follow up OP for endometrial Bx at [place] [Dr. Emeterio Reeve will message the office] if kidney function continues to improve - Patient has not been receiving Megestrol 40mg  BID regularly; will encourage regular dosing once able to tolerate  - Hemoglobin stable ~8; daily CBC's - Will hold off on iron for now given GI inflammation  though will likely require this vs. EPO in future   # Hypertension  Discontinued amlodipine given she is on Diltiazem.  - blood pressures remain elevated - If diltiazem does not improve dysmotility / vomiting, will restart amlodipine   Prior to Admission Living Arrangement: Home  Anticipated Discharge Location: Home with HH PT, OT, SLP, RN hopeful PCS, wheelchair and lift. Will need to discuss further with daughter. Barriers to Discharge: Decreased PO intake; Vomiting  Code Status: DNR/DNI Diet: Dysphagia 2 diet without fluid restriction IVF: None DVT PPx: SCD's given vaginal bleeding  Jeralyn Bennett, MD 09/10/2020, 5:06 PM Pager: [phone] After 5pm on weekdays and 1pm on weekends: On Call pager [phone]

## 2020-09-10 NOTE — Progress Notes (Signed)
OT Cancellation Note  Patient Details Name: Cristina Jordan MRN: [id number] DOB: September 18, 1961   Cancelled Treatment:    Reason Eval/Treat Not Completed: Fatigue/lethargy limiting ability to participate;Other (comment) Pt lethargic upon arrival, declining OT session d/t fatigue. Will check back as time allows for OT session.  Lanier Clam., COTA/L Acute Rehabilitation Services [phone] [place] 09/10/2020, 11:17 AM

## 2020-09-11 ENCOUNTER — Inpatient Hospital Stay (HOSPITAL_COMMUNITY): Payer: Medicaid Other

## 2020-09-11 DIAGNOSIS — N179 Acute kidney failure, unspecified: Secondary | ICD-10-CM | POA: Diagnosis not present

## 2020-09-11 DIAGNOSIS — K449 Diaphragmatic hernia without obstruction or gangrene: Secondary | ICD-10-CM | POA: Diagnosis not present

## 2020-09-11 DIAGNOSIS — M6282 Rhabdomyolysis: Secondary | ICD-10-CM | POA: Diagnosis not present

## 2020-09-11 DIAGNOSIS — K2950 Unspecified chronic gastritis without bleeding: Secondary | ICD-10-CM | POA: Diagnosis not present

## 2020-09-11 LAB — COMPREHENSIVE METABOLIC PANEL
ALT: 28 U/L (ref 0–44)
AST: 26 U/L (ref 15–41)
Albumin: 2 g/dL — ABNORMAL LOW (ref 3.5–5.0)
Alkaline Phosphatase: 81 U/L (ref 38–126)
Anion gap: 12 (ref 5–15)
BUN: 30 mg/dL — ABNORMAL HIGH (ref 6–20)
CO2: 13 mmol/L — ABNORMAL LOW (ref 22–32)
Calcium: 7.4 mg/dL — ABNORMAL LOW (ref 8.9–10.3)
Chloride: 110 mmol/L (ref 98–111)
Creatinine, Ser: 3.12 mg/dL — ABNORMAL HIGH (ref 0.44–1.00)
GFR calc Af Amer: 18 mL/min — ABNORMAL LOW (ref >60)
GFR calc non Af Amer: 16 mL/min — ABNORMAL LOW (ref >60)
Glucose, Bld: 102 mg/dL — ABNORMAL HIGH (ref 70–99)
Potassium: 4.4 mmol/L (ref 3.5–5.1)
Sodium: 135 mmol/L (ref 135–145)
Total Bilirubin: 0.6 mg/dL (ref 0.3–1.2)
Total Protein: 5.3 g/dL — ABNORMAL LOW (ref 6.5–8.1)

## 2020-09-11 LAB — CBC
HCT: 24.6 % — ABNORMAL LOW (ref 36.0–46.0)
Hemoglobin: 7.7 g/dL — ABNORMAL LOW (ref 12.0–15.0)
MCH: 29.1 pg (ref 26.0–34.0)
MCHC: 31.3 g/dL (ref 30.0–36.0)
MCV: 92.8 fL (ref 80.0–100.0)
Platelets: 278 10*3/uL (ref 150–400)
RBC: 2.65 MIL/uL — ABNORMAL LOW (ref 3.87–5.11)
RDW: 17.4 % — ABNORMAL HIGH (ref 11.5–15.5)
WBC: 8 10*3/uL (ref 4.0–10.5)
nRBC: 0 % (ref 0.0–0.2)

## 2020-09-11 LAB — GLUCOSE, CAPILLARY
Glucose-Capillary: 90 mg/dL (ref 70–99)
Glucose-Capillary: 91 mg/dL (ref 70–99)
Glucose-Capillary: 97 mg/dL (ref 70–99)

## 2020-09-11 LAB — PHOSPHORUS: Phosphorus: 3 mg/dL (ref 2.5–4.6)

## 2020-09-11 LAB — MAGNESIUM: Magnesium: 2 mg/dL (ref 1.7–2.4)

## 2020-09-11 MED ORDER — SODIUM BICARBONATE 8.4 % IV SOLN
INTRAVENOUS | Status: AC
  Filled 2020-09-11: qty 100

## 2020-09-11 NOTE — Progress Notes (Signed)
Subjective:   Patient complains of continued abdominal pain that is unchanged from prior, and continued n/v this morning. States she cannot eat or drink today due to nausea. She is complaining of worsening weakness and fatigue. PT has been seeing her and she feels she is making some progress. Denies diarrhea, unclear regarding her bowel movements.  Objective:  Vital signs in last 24 hours: Vitals:   09/10/20 2034 09/11/20 0526 09/11/20 0924 09/11/20 1657  BP: 137/85 138/84 139/83 (!) 142/87  Pulse: 97 91 89 83  Resp: 18 17 18 18   Temp: 98.5 F (36.9 C) 97.9 F (36.6 C) 98.1 F (36.7 C) 97.7 F (36.5 C)  TempSrc:   Oral Oral  SpO2: 100% 100% 100% 100%  Weight:      Height:       General: Patient is morbidly obese. She appears tired and uncomfortable but in no acute distress.  HENT: Mucus membranes are moist. Edentulous.  Respiratory: Anterior lung sounds decreased but CTA, bilaterally. Patient is tachypneic without increased work of breathing.  Cardiovascular: Rate is tachycardic. Rhythm is regular. No murmurs, rubs, or gallops.  Neurological: Patient has R-sided hemiparesis. Chronic slurred speech. Patient is somnolent. Abdominal: Soft without TTP, guarding, or rebound. No distention. Bowel sounds high pitched. Genitourinary: Foley is in place, draining urine. Skin: Warm and dry.   Assessment/Plan:  Active Problems:   Rhabdomyolysis   Hypokalemia   Hypocalcemia   Acute renal failure (HCC)   Malnutrition of moderate degree   Palliative care by specialist   DNR (do not resuscitate) discussion   Lethargy   DNR (do not resuscitate)   Weakness generalized   Vaginal bleeding   Colitis   Chronic gastritis without bleeding   Morbid obesity ([place])   Hiatal hernia   Non-intractable vomiting   Regurgitation of food   Iron deficiency anemia due to chronic blood loss   # Improving Subacute Colitis, likely Ischemic  # Chronic Duodenitis/Gastritis # Likely Esophageal  Dysmotility EGD biopsies suggestive of chronic peptic duodenitis, chronic gastritis with reactive esophagitis, and flex sigmoidoscopy consistent with likely acute, infectious, self-limited colitis. Repeat CT abdomen / pelvis 09/02/20 showed improved colitis that may be ischemic in nature with gastric inflammation. Patient's nausea and vomiting continue, with diffuse abdominal pain and throat pain but resolved diarrhea. Esophogram showed likely mid-distal esophageal dysmotility; most likely the cause for her continued symptoms in addition to duodenitis, gastritis, and esophagitis. Patient has high pitched bowel movements with decreased stools. KUB consistent with ileus; will check CT abdomen pelvis. - GI have signed off given improvement - Continue PPI IV BID for 4 weeks then decrease to 1x daily for 8 weeks followed by EGD in 3 months to document healing, per GI. 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 Creatinine and BUN stabilizing ~ 3, BUN improved. AKI likely prerenal due to prolonged poor PO intake and diarrhea. Renal Ultrasound showed increased echogenicity consistent with medical renal disease. Patient appears euvolemic. VBG showed pH 7.367, pCO2 21.9,  pO2 38.9, bicarb 12.4. anion gap 11. Urine anion gap of 41. Urine pH 7.0.Unable to tolerate PO medications currently. - Nephrology signed off given stability/improvement; however, patient is a poor dialysis candidate. PT and OT have  recommended SNF placement; however, patient has expressed desire to go home.  - HH OT, PT, SLP, and RN ordered, and hopeful for PCS early this week for discharge home to family if this is in line with patient and family's wishes - DME orders placed for wheelchair and lift - Will start IV NaHCO3 120mL/hr (1L x 3) with plan to switch to oral once HCO3 improves to ~18 if patient able to tolerate PO intake  - Discontinued LR given incompatibility with NaHCO3  - Monitor Strict I&O and consider starting NS if needed - Continue to monitor daily renal function  # Electrolyte Abnormalities with Moderate Malnutrition Patient has only been consuming broth for at least 2 weeks at home with N/V/D. Nutrition endorses 22% weight loss over past 3 months.  Potassium on arrival was less than 2, corrected calcium was 6.8, magnesium was 1.4 likely secondary to poor p.o. intake, nausea vomiting and diarrhea. Prolonged QTc. IJ tunnel cath placed 0/26/37 without complication. - Potassium improved  - Albumin low, encouraged PO intake as tolerated - Daily monitoring   # Stable Normocytic Anemia Likely due to vaginal bleeding and worsening CKD. Her nausea may be 2/2 undiagnosed gynecological malignancy. Hbg now stable s/p transfusions. Patient denies known bleeding today.  - OB-GYN consulted; patient will be scheduled to follow up OP for endometrial Bx at [facility] [Dr. Emeterio Reeve will message the office] if kidney function continues to improve - Patient has not been receiving Megestrol 40mg  BID regularly; will encourage regular dosing once able to tolerate  - Hemoglobin stable ~8; daily CBC's - Will hold off on iron for now given GI inflammation though will likely require this vs. EPO in future   # Hypertension  Discontinued amlodipine given she is on Diltiazem.  - blood pressures remain elevated - If diltiazem does not improve dysmotility / vomiting, will restart amlodipine   Prior to Admission Living  Arrangement: Home  Anticipated Discharge Location: Home with HH PT, OT, SLP, RN hopeful PCS, wheelchair and lift. Will need to discuss further with daughter. Barriers to Discharge: Decreased PO intake; Vomiting  Code Status: DNR/DNI Diet: Dysphagia 2 diet without fluid restriction IVF: None DVT PPx: SCD's given vaginal bleeding  Jeralyn Bennett, MD 09/11/2020, 5:44 PM Pager: [phone] After 5pm on weekdays and 1pm on weekends: On Call pager [phone]

## 2020-09-11 NOTE — Progress Notes (Signed)
Physical Therapy Treatment Patient Details Name: Cristina Jordan MRN: [id number] DOB: [birth year] Today's Date: 09/11/2020    History of Present Illness 59 year old woman with h/o CVA, WC bound, presenting with worsening weakness.  She also has nausea, vomiting, decreased PO intake, abdominal pain, diarrhea X 1 week and weak to the point of not being able to get out of bed.  She was found to have colitis, Severe AKI, and electrolyte abnormalities.      PT Comments    The pt was in bed upon arrival of PT/OT, and initially agreeable to session at this time. However, with continued attempts at mobility and bed-level exercises, the pt was increasingly limited by lethargy, fatigue, and nausea. The pt eventually declined further attempts at mobility or exercises at this time, and the RN was alerted to the pt's condition. The pt is typically highly motivated to engage in mobility and OOB activity, so this is a significant change in presentation compared to prior therapy sessions. The pt will continue to benefit from skilled PT to progress strength and activity tolerance for bed mobility, transfers, and progression to OOB activity, and I continue to recommend d/c to SNF when medically ready due to the significant level of assist required for all mobility.     Follow Up Recommendations  SNF;Supervision/Assistance - 24 hour     Equipment Recommendations  Other (comment) (if going home will need hospital bed and hoyer lift)    Recommendations for Other Services       Precautions / Restrictions Precautions Precautions: Fall Precaution Comments: baseline R-sided hemiplegia Restrictions Weight Bearing Restrictions: No Other Position/Activity Restrictions: flaccid RUE/RLE    Mobility  Bed Mobility Overal bed mobility: Needs Assistance             General bed mobility comments: pt declined all mobility today (rolling or sitting EOB) due to feeling "tired" and nauseous. Told RN of change in  status as the pt is typically highly motivated to attempt OOB activity     Balance Overall balance assessment: Needs assistance                                          Cognition Arousal/Alertness: Lethargic Behavior During Therapy: Flat affect Overall Cognitive Status: No family/caregiver present to determine baseline cognitive functioning                                 General Comments: pt with significant increase in lethargy and drowsiness. able to answer questions appropriately, but with increased time at this time.      Exercises General Exercises - Upper Extremity Shoulder Flexion: 5 reps;Theraband;Left;Strengthening;Supine Theraband Level (Shoulder Flexion): Level 2 (Red) Shoulder Horizontal ADduction: AROM;5 reps;Left;Supine;Theraband Theraband Level (Shoulder Horizontal Adduction): Level 2 (Red) General Exercises - Lower Extremity Ankle Circles/Pumps: AROM;Left;10 reps;Supine Heel Slides: Left;10 reps;Supine;AAROM    General Comments General comments (skin integrity, edema, etc.): VSS on RA, but pt with increased fatigue, nausea, and lethargy today. RN alerted.      Pertinent Vitals/Pain Pain Assessment: Faces Faces Pain Scale: Hurts a little bit Pain Location: grimacing and moaning during rolling and pericare in sidelying Pain Descriptors / Indicators: Grimacing;Moaning Pain Intervention(s): Limited activity within patient's tolerance;Monitored during session;Repositioned           PT Goals (current goals can now be found in  the care plan section) Acute Rehab PT Goals Patient Stated Goal: Requested to sit up on EOB PT Goal Formulation: With patient Time For Goal Achievement: 09/25/20 Potential to Achieve Goals: Fair Progress towards PT goals: Progressing toward goals    Frequency    Min 2X/week      PT Plan Current plan remains appropriate    Co-evaluation PT/OT/SLP Co-Evaluation/Treatment: Yes Reason for  Co-Treatment: For patient/therapist safety;To address functional/ADL transfers PT goals addressed during session: Mobility/safety with mobility;Strengthening/ROM        AM-PAC PT "6 Clicks" Mobility   Outcome Measure  Help needed turning from your back to your side while in a flat bed without using bedrails?: Total Help needed moving from lying on your back to sitting on the side of a flat bed without using bedrails?: Total Help needed moving to and from a bed to a chair (including a wheelchair)?: Total Help needed standing up from a chair using your arms (e.g., wheelchair or bedside chair)?: Total Help needed to walk in hospital room?: Total Help needed climbing 3-5 steps with a railing? : Total 6 Click Score: 6    End of Session Equipment Utilized During Treatment: Gait belt Activity Tolerance: Patient limited by fatigue Patient left: in bed;with call bell/phone within reach Nurse Communication: Mobility status PT Visit Diagnosis: Other abnormalities of gait and mobility (R26.89);Hemiplegia and hemiparesis Hemiplegia - Right/Left: Right Hemiplegia - caused by: Cerebral infarction     Time: 8413-2440 PT Time Calculation (min) (ACUTE ONLY): 25 min  Charges:  $Therapeutic Activity: 8-22 mins                     Karma Ganja, PT, DPT   Acute Rehabilitation Department Pager #: [phone]   Otho Bellows 09/11/2020, 12:54 PM

## 2020-09-11 NOTE — Progress Notes (Signed)
Patient ID: Cristina Jordan, female   DOB: 1960/12/29, 59 y.o.   MRN: [id number]  This NP visited patient at the bedside as a follow up for palliative medicine  needs and emotional support.  59 YO female with PMH significant for CVA/2017, (WC-bound, morbid obesity, aphasic),  Admitted for nausea/vomitting, anemia, worsening renal function, uterine bleeding. Complicated  hospital stay.  EGD biopsies suggestive of chronic gastric inflammation and gastritis. Renal function has improved.  Today is day 53  Patient is alert and oriented, however she remains weak.  Continued conversation today with patient and her daughter Cristina Jordan by phone.  Both  verbalize an understanding of her current medical situation and hope for improvement. Patient is open to all offered and available medical interventions to prolong life.  Plan is to retrun home with [facility] and palliaitve services when ready for discharge   Questions and concerns addressed to the best of my ability.    Education offered regarding the importance of continued conversation with  each other and the medical providers regarding overall plan of care and treatment options,  ensuring decisions are within the context of the patients values and GOCs.  Stress importance of advanced care planning.  Touched base with attending team in effort to determine plan of care.  Ongoing interventions to treat the treatable, stabilize and dc home.   I worry patient is high risk for decompensation 2/2 to multiple co-morbidites.    PMT will continue to support holistically  Total time spent on the unit was 25 minutes  Greater than 50% of the time was spent in counseling and coordination of care  Wadie Lessen NP  Palliative Medicine Team Team Phone # [phone] Pager [phone]

## 2020-09-11 NOTE — Progress Notes (Signed)
Occupational Therapy Treatment Patient Details Name: Cristina Jordan MRN: [id number] DOB: 1961-03-16 Today's Date: 09/11/2020    History of present illness 59 year old woman with h/o CVA, WC bound, presenting with worsening weakness.  She also has nausea, vomiting, decreased PO intake, abdominal pain, diarrhea X 1 week and weak to the point of not being able to get out of bed.  She was found to have colitis, Severe AKI, and electrolyte abnormalities.     OT comments  Patient supine in bed and agreeable to OT/PT session.  Discussed goals, patient reporting main goal is still to get home and OOB.  Patient reports too fatigued today to try EOB, but patient agreeable to bed level tasks for strength/endurance training in prep for ADLs and mobility.  Patient engaged in [place] exercises (as below) but noted greatly decreased tolerance, only completing 5 reps with less resistance on band for UE.  Patient became nauseated, emesis; assisted patient to get comfortable, min assist to wipe mouth and notified RN of patient condition. Will continue to follow patient, notified RN of patients decreased affect and increased lethargy level.  May benefit from palliative care consult to establish goals of care. Will follow.    Follow Up Recommendations  SNF    Equipment Recommendations  Hospital bed;Other (comment) (hoyer lift- IF going home)    Recommendations for Other Services Other (comment) (palliative care )    Precautions / Restrictions Precautions Precautions: Fall Precaution Comments: baseline R-sided hemiplegia Restrictions Weight Bearing Restrictions: No Other Position/Activity Restrictions: flaccid RUE/RLE       Mobility Bed Mobility Overal bed mobility: Needs Assistance             General bed mobility comments: pt declined all mobility today (rolling or sitting EOB) due to feeling "tired" and nauseous. Told RN of change in status as the pt is typically highly motivated to attempt OOB  activity  Transfers                      Balance Overall balance assessment: Needs assistance                                         ADL either performed or assessed with clinical judgement   ADL Overall ADL's : Needs assistance/impaired     Grooming: Minimal assistance;Bed level;Wash/dry face Grooming Details (indicate cue type and reason): washing face only today                               General ADL Comments: pt declined OOB to chair, or EOB today      Vision       Perception     Praxis      Cognition Arousal/Alertness: Lethargic Behavior During Therapy: Flat affect Overall Cognitive Status: No family/caregiver present to determine baseline cognitive functioning                                 General Comments: pt with significant increase in lethargy and drowsiness. able to answer questions appropriately, but with increased time at this time.        Exercises Exercises: General Upper Extremity General Exercises - Upper Extremity Shoulder Flexion: 5 reps;Theraband;Left;Strengthening;Supine Theraband Level (Shoulder Flexion): Level 2 (Red) Shoulder Horizontal ADduction: 5  reps;Left;Supine;Theraband;Strengthening Theraband Level (Shoulder Horizontal Adduction): Level 2 (Red) General Exercises - Lower Extremity Ankle Circles/Pumps: AROM;Left;10 reps;Supine Heel Slides: Left;10 reps;Supine;AAROM   Shoulder Instructions       General Comments VSS on RA< patient with increased fatigue today/flat affect.  Nausea and emesis after minimal activity of R UE/LE exercises- RN notified     Pertinent Vitals/ Pain       Pain Assessment: Faces Faces Pain Scale: Hurts a little bit Pain Location: generalized Pain Descriptors / Indicators: Discomfort;Grimacing Pain Intervention(s): Limited activity within patient's tolerance;Monitored during session;Repositioned  Home Living                                           Prior Functioning/Environment              Frequency  Min 2X/week        Progress Toward Goals  OT Goals(current goals can now be found in the care plan section)  Progress towards OT goals: Not progressing toward goals - comment (lethargic and fatigued today)  Acute Rehab OT Goals Patient Stated Goal: to get home  OT Goal Formulation: With patient ADL Goals Pt/caregiver will Perform Home Exercise Program: Increased strength;Left upper extremity;With written HEP provided Additional ADL Goal #1: Pt will complete bed mobility with mod assist +2 for positioning, self care and pressure relief. Additional ADL Goal #2: Patient will transition to long sitting/ EOB sitting with max assist +2 as precursor to ADLs. Additional ADL Goal #3: Patient will verbalize splint schedule to R UE, be able to teach caregiver how to don with independence.  Plan Discharge plan remains appropriate;Frequency remains appropriate    Co-evaluation    PT/OT/SLP Co-Evaluation/Treatment: Yes Reason for Co-Treatment: For patient/therapist safety;To address functional/ADL transfers PT goals addressed during session: Mobility/safety with mobility;Strengthening/ROM OT goals addressed during session: ADL's and self-care      AM-PAC OT "6 Clicks" Daily Activity     Outcome Measure   Help from another person eating meals?: A Little Help from another person taking care of personal grooming?: A Lot Help from another person toileting, which includes using toliet, bedpan, or urinal?: Total Help from another person bathing (including washing, rinsing, drying)?: Total Help from another person to put on and taking off regular upper body clothing?: Total Help from another person to put on and taking off regular lower body clothing?: Total 6 Click Score: 9    End of Session    OT Visit Diagnosis: Unsteadiness on feet (R26.81);Other abnormalities of gait and mobility (R26.89);Muscle weakness  (generalized) (M62.81);Hemiplegia and hemiparesis;Pain Hemiplegia - Right/Left: Right   Activity Tolerance Patient limited by lethargy;Patient limited by fatigue   Patient Left in bed;with call bell/phone within reach;with bed alarm set   Nurse Communication Mobility status;Other (comment) (lethargy, affect, nausea/emesis )        Time: 6270-3500 OT Time Calculation (min): 25 min  Charges: OT General Charges $OT Visit: 1 Visit OT Treatments $Self Care/Home Management : 8-22 mins  [place] Pager [phone] Office [phone]    [name] 09/11/2020, 1:32 PM

## 2020-09-11 NOTE — Progress Notes (Signed)
Patient is more lethargic than normal but easily aroused, MD has been notified and there are currently no new orders at time. RN will continue to monitor this patient

## 2020-09-12 ENCOUNTER — Inpatient Hospital Stay (HOSPITAL_COMMUNITY): Payer: Medicaid Other

## 2020-09-12 ENCOUNTER — Inpatient Hospital Stay (HOSPITAL_COMMUNITY): Payer: Medicaid Other | Admitting: Anesthesiology

## 2020-09-12 ENCOUNTER — Encounter (HOSPITAL_COMMUNITY): Admission: EM | Disposition: E | Payer: Self-pay | Source: Home / Self Care | Attending: Internal Medicine

## 2020-09-12 ENCOUNTER — Encounter (HOSPITAL_COMMUNITY): Payer: Self-pay | Admitting: Internal Medicine

## 2020-09-12 DIAGNOSIS — Z9911 Dependence on respirator [ventilator] status: Secondary | ICD-10-CM

## 2020-09-12 DIAGNOSIS — N1832 Chronic kidney disease, stage 3b: Secondary | ICD-10-CM

## 2020-09-12 DIAGNOSIS — K439 Ventral hernia without obstruction or gangrene: Secondary | ICD-10-CM

## 2020-09-12 DIAGNOSIS — K2950 Unspecified chronic gastritis without bleeding: Secondary | ICD-10-CM | POA: Diagnosis not present

## 2020-09-12 DIAGNOSIS — I9589 Other hypotension: Secondary | ICD-10-CM

## 2020-09-12 DIAGNOSIS — E861 Hypovolemia: Secondary | ICD-10-CM

## 2020-09-12 DIAGNOSIS — K449 Diaphragmatic hernia without obstruction or gangrene: Secondary | ICD-10-CM | POA: Diagnosis not present

## 2020-09-12 DIAGNOSIS — M6282 Rhabdomyolysis: Secondary | ICD-10-CM | POA: Diagnosis not present

## 2020-09-12 DIAGNOSIS — E872 Acidosis: Secondary | ICD-10-CM

## 2020-09-12 DIAGNOSIS — K56609 Unspecified intestinal obstruction, unspecified as to partial versus complete obstruction: Secondary | ICD-10-CM

## 2020-09-12 HISTORY — PX: LAPAROTOMY: SHX154

## 2020-09-12 HISTORY — PX: LYSIS OF ADHESION: SHX5961

## 2020-09-12 HISTORY — PX: VENTRAL HERNIA REPAIR: SHX424

## 2020-09-12 LAB — RENAL FUNCTION PANEL
Albumin: 1.9 g/dL — ABNORMAL LOW (ref 3.5–5.0)
Anion gap: 11 (ref 5–15)
BUN: 33 mg/dL — ABNORMAL HIGH (ref 6–20)
CO2: 14 mmol/L — ABNORMAL LOW (ref 22–32)
Calcium: 7.3 mg/dL — ABNORMAL LOW (ref 8.9–10.3)
Chloride: 109 mmol/L (ref 98–111)
Creatinine, Ser: 3.33 mg/dL — ABNORMAL HIGH (ref 0.44–1.00)
GFR calc Af Amer: 17 mL/min — ABNORMAL LOW (ref >60)
GFR calc non Af Amer: 15 mL/min — ABNORMAL LOW (ref >60)
Glucose, Bld: 107 mg/dL — ABNORMAL HIGH (ref 70–99)
Phosphorus: 3.1 mg/dL (ref 2.5–4.6)
Potassium: 4.2 mmol/L (ref 3.5–5.1)
Sodium: 134 mmol/L — ABNORMAL LOW (ref 135–145)

## 2020-09-12 LAB — CBC
HCT: 24.1 % — ABNORMAL LOW (ref 36.0–46.0)
Hemoglobin: 7.7 g/dL — ABNORMAL LOW (ref 12.0–15.0)
MCH: 29.1 pg (ref 26.0–34.0)
MCHC: 32 g/dL (ref 30.0–36.0)
MCV: 90.9 fL (ref 80.0–100.0)
Platelets: 252 10*3/uL (ref 150–400)
RBC: 2.65 MIL/uL — ABNORMAL LOW (ref 3.87–5.11)
RDW: 17.4 % — ABNORMAL HIGH (ref 11.5–15.5)
WBC: 7.1 10*3/uL (ref 4.0–10.5)
nRBC: 0 % (ref 0.0–0.2)

## 2020-09-12 LAB — POCT I-STAT 7, (LYTES, BLD GAS, ICA,H+H)
Acid-base deficit: 10 mmol/L — ABNORMAL HIGH (ref 0.0–2.0)
Bicarbonate: 16.1 mmol/L — ABNORMAL LOW (ref 20.0–28.0)
Calcium, Ion: 1.09 mmol/L — ABNORMAL LOW (ref 1.15–1.40)
HCT: 22 % — ABNORMAL LOW (ref 36.0–46.0)
Hemoglobin: 7.5 g/dL — ABNORMAL LOW (ref 12.0–15.0)
O2 Saturation: 100 %
Patient temperature: 36
Potassium: 3.9 mmol/L (ref 3.5–5.1)
Sodium: 139 mmol/L (ref 135–145)
TCO2: 17 mmol/L — ABNORMAL LOW (ref 22–32)
pCO2 arterial: 32.4 mmHg (ref 32.0–48.0)
pH, Arterial: 7.299 — ABNORMAL LOW (ref 7.350–7.450)
pO2, Arterial: 263 mmHg — ABNORMAL HIGH (ref 83.0–108.0)

## 2020-09-12 LAB — PREPARE RBC (CROSSMATCH)

## 2020-09-12 LAB — GLUCOSE, CAPILLARY: Glucose-Capillary: 94 mg/dL (ref 70–99)

## 2020-09-12 LAB — MAGNESIUM: Magnesium: 1.8 mg/dL (ref 1.7–2.4)

## 2020-09-12 LAB — LACTIC ACID, PLASMA
Lactic Acid, Venous: 3.2 mmol/L (ref 0.5–1.9)
Lactic Acid, Venous: 2.9 mmol/L (ref 0.5–1.9)

## 2020-09-12 SURGERY — LAPAROTOMY, EXPLORATORY
Anesthesia: General | Site: Abdomen

## 2020-09-12 MED ORDER — FENTANYL CITRATE (PF) 250 MCG/5ML IJ SOLN
INTRAMUSCULAR | Status: AC
  Filled 2020-09-12: qty 5

## 2020-09-12 MED ORDER — LIDOCAINE 2% (20 MG/ML) 5 ML SYRINGE
INTRAMUSCULAR | Status: DC | PRN
  Administered 2020-09-12: 60 mg via INTRAVENOUS

## 2020-09-12 MED ORDER — ROCURONIUM BROMIDE 10 MG/ML (PF) SYRINGE
PREFILLED_SYRINGE | INTRAVENOUS | Status: DC | PRN
  Administered 2020-09-12: 30 mg via INTRAVENOUS
  Administered 2020-09-12: 70 mg via INTRAVENOUS
  Administered 2020-09-12 (×2): 50 mg via INTRAVENOUS

## 2020-09-12 MED ORDER — SODIUM BICARBONATE 8.4 % IV SOLN
INTRAVENOUS | Status: AC
  Filled 2020-09-12: qty 50

## 2020-09-12 MED ORDER — PROPOFOL 10 MG/ML IV BOLUS
INTRAVENOUS | Status: AC
  Filled 2020-09-12: qty 20

## 2020-09-12 MED ORDER — CEFAZOLIN SODIUM 1 G IJ SOLR
INTRAMUSCULAR | Status: AC
  Filled 2020-09-12: qty 20

## 2020-09-12 MED ORDER — CHLORHEXIDINE GLUCONATE 0.12 % MT SOLN
15.0000 mL | Freq: Once | OROMUCOSAL | Status: AC
  Administered 2020-09-12: 15 mL via OROMUCOSAL
  Filled 2020-09-12: qty 15

## 2020-09-12 MED ORDER — STERILE WATER FOR IRRIGATION IR SOLN
Status: DC | PRN
  Administered 2020-09-12: 1000 mL

## 2020-09-12 MED ORDER — ALBUMIN HUMAN 5 % IV SOLN: INTRAVENOUS | Status: DC | PRN

## 2020-09-12 MED ORDER — SODIUM CHLORIDE 0.9% IV SOLUTION: Freq: Once | INTRAVENOUS | Status: DC

## 2020-09-12 MED ORDER — MIDAZOLAM HCL 2 MG/2ML IJ SOLN
INTRAMUSCULAR | Status: AC
  Filled 2020-09-12: qty 2

## 2020-09-12 MED ORDER — SODIUM BICARBONATE 8.4 % IV SOLN
INTRAVENOUS | Status: DC
  Filled 2020-09-12 (×11): qty 100

## 2020-09-12 MED ORDER — POLYETHYLENE GLYCOL 3350 17 G PO PACK
17.0000 g | PACK | Freq: Every day | ORAL | Status: DC
  Administered 2020-09-13 – 2020-09-15 (×2): 17 g
  Filled 2020-09-12 (×2): qty 1

## 2020-09-12 MED ORDER — ROCURONIUM BROMIDE 10 MG/ML (PF) SYRINGE
PREFILLED_SYRINGE | INTRAVENOUS | Status: AC
  Filled 2020-09-12: qty 10

## 2020-09-12 MED ORDER — EPHEDRINE 5 MG/ML INJ
INTRAVENOUS | Status: AC
  Filled 2020-09-12: qty 10

## 2020-09-12 MED ORDER — DOCUSATE SODIUM 50 MG/5ML PO LIQD
100.0000 mg | Freq: Two times a day (BID) | ORAL | Status: DC
  Administered 2020-09-12 – 2020-09-13 (×3): 100 mg
  Filled 2020-09-12 (×5): qty 10

## 2020-09-12 MED ORDER — ONDANSETRON HCL 4 MG/2ML IJ SOLN
INTRAMUSCULAR | Status: AC
  Filled 2020-09-12: qty 2

## 2020-09-12 MED ORDER — SODIUM CHLORIDE 0.9 % IV SOLN
2.0000 g | INTRAVENOUS | Status: AC
  Administered 2020-09-12: 2 g via INTRAVENOUS
  Filled 2020-09-12: qty 2

## 2020-09-12 MED ORDER — PROPOFOL 10 MG/ML IV BOLUS
INTRAVENOUS | Status: DC | PRN
  Administered 2020-09-12: 100 mg via INTRAVENOUS

## 2020-09-12 MED ORDER — PHENYLEPHRINE CONCENTRATED 100MG/250ML (0.4 MG/ML) INFUSION SIMPLE
0.0000 ug/min | INTRAVENOUS | Status: DC
  Administered 2020-09-12: 20 ug/min via INTRAVENOUS
  Filled 2020-09-12: qty 250

## 2020-09-12 MED ORDER — PHENYLEPHRINE 40 MCG/ML (10ML) SYRINGE FOR IV PUSH (FOR BLOOD PRESSURE SUPPORT)
PREFILLED_SYRINGE | INTRAVENOUS | Status: DC | PRN
  Administered 2020-09-12 (×2): 120 ug via INTRAVENOUS
  Administered 2020-09-12: 80 ug via INTRAVENOUS

## 2020-09-12 MED ORDER — PHENYLEPHRINE HCL-NACL 10-0.9 MG/250ML-% IV SOLN
INTRAVENOUS | Status: DC | PRN
  Administered 2020-09-12: 75 ug/min via INTRAVENOUS

## 2020-09-12 MED ORDER — FENTANYL CITRATE (PF) 100 MCG/2ML IJ SOLN
50.0000 ug | INTRAMUSCULAR | Status: DC | PRN
  Administered 2020-09-13: 100 ug via INTRAVENOUS
  Filled 2020-09-12: qty 2

## 2020-09-12 MED ORDER — FENTANYL CITRATE (PF) 250 MCG/5ML IJ SOLN
INTRAMUSCULAR | Status: DC | PRN
Start: 2020-09-12 — End: 2020-09-12
  Administered 2020-09-12: 100 ug via INTRAVENOUS

## 2020-09-12 MED ORDER — PHENYLEPHRINE HCL-NACL 10-0.9 MG/250ML-% IV SOLN
0.0000 ug/min | INTRAVENOUS | Status: DC
  Administered 2020-09-12: 35 ug/min via INTRAVENOUS

## 2020-09-12 MED ORDER — ACETAMINOPHEN 325 MG PO TABS: 650.0000 mg | ORAL_TABLET | Freq: Four times a day (QID) | ORAL | Status: DC | PRN

## 2020-09-12 MED ORDER — CALCIUM CHLORIDE 10 % IV SOLN
INTRAVENOUS | Status: AC
  Filled 2020-09-12: qty 10

## 2020-09-12 MED ORDER — SODIUM BICARBONATE 8.4 % IV SOLN
INTRAVENOUS | Status: DC | PRN
  Administered 2020-09-12: 50 meq via INTRAVENOUS

## 2020-09-12 MED ORDER — CHLORHEXIDINE GLUCONATE 0.12% ORAL RINSE (MEDLINE KIT)
15.0000 mL | Freq: Two times a day (BID) | OROMUCOSAL | Status: DC
  Administered 2020-09-12 – 2020-09-16 (×8): 15 mL via OROMUCOSAL

## 2020-09-12 MED ORDER — LIDOCAINE 2% (20 MG/ML) 5 ML SYRINGE
INTRAMUSCULAR | Status: AC
  Filled 2020-09-12: qty 5

## 2020-09-12 MED ORDER — DEXAMETHASONE SODIUM PHOSPHATE 10 MG/ML IJ SOLN
INTRAMUSCULAR | Status: AC
  Filled 2020-09-12: qty 1

## 2020-09-12 MED ORDER — CALCIUM CHLORIDE 10 % IV SOLN
INTRAVENOUS | Status: DC | PRN
  Administered 2020-09-12 (×5): 200 mg via INTRAVENOUS

## 2020-09-12 MED ORDER — PROPOFOL 1000 MG/100ML IV EMUL
0.0000 ug/kg/min | INTRAVENOUS | Status: DC
  Administered 2020-09-13: 19.981 ug/kg/min via INTRAVENOUS
  Filled 2020-09-12 (×3): qty 100

## 2020-09-12 MED ORDER — SODIUM CHLORIDE 0.9 % IV SOLN: INTRAVENOUS | Status: DC

## 2020-09-12 MED ORDER — VASOPRESSIN 20 UNIT/ML IV SOLN
INTRAVENOUS | Status: DC | PRN
  Administered 2020-09-12 (×5): 1 [IU] via INTRAVENOUS

## 2020-09-12 MED ORDER — 0.9 % SODIUM CHLORIDE (POUR BTL) OPTIME
TOPICAL | Status: DC | PRN
  Administered 2020-09-12: 1000 mL

## 2020-09-12 MED ORDER — SODIUM BICARBONATE 8.4 % IV SOLN
INTRAVENOUS | Status: DC
  Filled 2020-09-12 (×2): qty 100

## 2020-09-12 MED ORDER — MIDAZOLAM HCL 2 MG/2ML IJ SOLN
INTRAMUSCULAR | Status: DC | PRN
  Administered 2020-09-12 (×2): 1 mg via INTRAVENOUS

## 2020-09-12 MED ORDER — ORAL CARE MOUTH RINSE
15.0000 mL | OROMUCOSAL | Status: DC
  Administered 2020-09-12 – 2020-09-13 (×10): 15 mL via OROMUCOSAL

## 2020-09-12 MED ORDER — VASOPRESSIN 20 UNIT/ML IV SOLN
INTRAVENOUS | Status: AC
  Filled 2020-09-12: qty 1

## 2020-09-12 MED ORDER — PROPOFOL 1000 MG/100ML IV EMUL
INTRAVENOUS | Status: AC
  Filled 2020-09-12: qty 100

## 2020-09-12 MED ORDER — MIDAZOLAM HCL 2 MG/2ML IJ SOLN: 2.0000 mg | INTRAMUSCULAR | Status: DC | PRN

## 2020-09-12 MED ORDER — PROPOFOL 500 MG/50ML IV EMUL
INTRAVENOUS | Status: DC | PRN
  Administered 2020-09-12: 50 ug/kg/min via INTRAVENOUS

## 2020-09-12 MED ORDER — SODIUM BICARBONATE 8.4 % IV SOLN
50.0000 meq | Freq: Once | INTRAVENOUS | Status: AC
  Administered 2020-09-12: 50 meq via INTRAVENOUS

## 2020-09-12 MED ORDER — ACETAMINOPHEN 650 MG RE SUPP: 650.0000 mg | RECTAL | Status: DC | PRN

## 2020-09-12 MED ORDER — PHENYLEPHRINE 40 MCG/ML (10ML) SYRINGE FOR IV PUSH (FOR BLOOD PRESSURE SUPPORT)
PREFILLED_SYRINGE | INTRAVENOUS | Status: AC
  Filled 2020-09-12: qty 10

## 2020-09-12 SURGICAL SUPPLY — 57 items
BLADE CLIPPER SURG (BLADE) IMPLANT
BNDG GAUZE ELAST 4 BULKY (GAUZE/BANDAGES/DRESSINGS) ×4 IMPLANT
CANISTER SUCT 3000ML PPV (MISCELLANEOUS) ×4 IMPLANT
CATH ROBINSON RED A/P 18FR (CATHETERS) ×8 IMPLANT
CHLORAPREP W/TINT 26 (MISCELLANEOUS) ×4 IMPLANT
COVER SURGICAL LIGHT HANDLE (MISCELLANEOUS) ×4 IMPLANT
COVER WAND RF STERILE (DRAPES) IMPLANT
DECANTER SPIKE VIAL GLASS SM (MISCELLANEOUS) ×4 IMPLANT
DRAPE LAPAROSCOPIC ABDOMINAL (DRAPES) ×4 IMPLANT
DRAPE WARM FLUID 44X44 (DRAPES) ×4 IMPLANT
DRSG OPSITE POSTOP 4X10 (GAUZE/BANDAGES/DRESSINGS) IMPLANT
DRSG OPSITE POSTOP 4X8 (GAUZE/BANDAGES/DRESSINGS) IMPLANT
DRSG PAD ABDOMINAL 8X10 ST (GAUZE/BANDAGES/DRESSINGS) ×4 IMPLANT
ELECT BLADE 6.5 EXT (BLADE) IMPLANT
ELECT CAUTERY BLADE 6.4 (BLADE) ×4 IMPLANT
ELECT REM PT RETURN 9FT ADLT (ELECTROSURGICAL) ×4
ELECTRODE REM PT RTRN 9FT ADLT (ELECTROSURGICAL) ×2 IMPLANT
GAUZE SPONGE 4X4 12PLY STRL (GAUZE/BANDAGES/DRESSINGS) IMPLANT
GLOVE BIO SURGEON STRL SZ7 (GLOVE) ×4 IMPLANT
GLOVE BIOGEL PI IND STRL 7.5 (GLOVE) ×2 IMPLANT
GLOVE BIOGEL PI INDICATOR 7.5 (GLOVE) ×2
GLOVE SS BIOGEL STRL SZ 7.5 (GLOVE) ×2 IMPLANT
GLOVE SUPERSENSE BIOGEL SZ 7.5 (GLOVE) ×2
GOWN STRL REUS W/ TWL LRG LVL3 (GOWN DISPOSABLE) ×6 IMPLANT
GOWN STRL REUS W/TWL LRG LVL3 (GOWN DISPOSABLE) ×6
HANDLE SUCTION POOLE (INSTRUMENTS) ×4 IMPLANT
KIT BASIN OR (CUSTOM PROCEDURE TRAY) ×4 IMPLANT
KIT TURNOVER KIT B (KITS) ×4 IMPLANT
LIGASURE IMPACT 36 18CM CVD LR (INSTRUMENTS) IMPLANT
NS IRRIG 1000ML POUR BTL (IV SOLUTION) ×8 IMPLANT
PACK GENERAL/GYN (CUSTOM PROCEDURE TRAY) ×4 IMPLANT
PAD ARMBOARD 7.5X6 YLW CONV (MISCELLANEOUS) ×8 IMPLANT
PENCIL SMOKE EVACUATOR (MISCELLANEOUS) ×4 IMPLANT
RETAINER VISCERA MED (MISCELLANEOUS) ×4 IMPLANT
SPECIMEN JAR LARGE (MISCELLANEOUS) IMPLANT
SPONGE LAP 18X18 RF (DISPOSABLE) ×4 IMPLANT
STAPLER VISISTAT 35W (STAPLE) IMPLANT
SUCTION POOLE HANDLE (INSTRUMENTS) ×8
SUT ETHILON 1 LR 30 (SUTURE) ×20 IMPLANT
SUT ETHILON 1 TP 1 60 (SUTURE) ×4 IMPLANT
SUT NOV 1 T60/GS (SUTURE) IMPLANT
SUT NOVA 1 T20/GS 25DT (SUTURE) ×4 IMPLANT
SUT NOVA NAB GS-21 0 18 T12 DT (SUTURE) IMPLANT
SUT PDS AB 1 TP1 96 (SUTURE) ×16 IMPLANT
SUT SILK 2 0 (SUTURE) ×2
SUT SILK 2 0 SH CR/8 (SUTURE) ×4 IMPLANT
SUT SILK 2-0 18XBRD TIE 12 (SUTURE) ×2 IMPLANT
SUT SILK 3 0 (SUTURE) ×2
SUT SILK 3 0 SH CR/8 (SUTURE) ×4 IMPLANT
SUT SILK 3-0 18XBRD TIE 12 (SUTURE) ×2 IMPLANT
SUT VIC AB 3-0 SH 27 (SUTURE)
SUT VIC AB 3-0 SH 27X BRD (SUTURE) IMPLANT
TOWEL GREEN STERILE (TOWEL DISPOSABLE) ×4 IMPLANT
TOWEL GREEN STERILE FF (TOWEL DISPOSABLE) ×4 IMPLANT
TRAY FOLEY MTR SLVR 14FR STAT (SET/KITS/TRAYS/PACK) IMPLANT
TRAY FOLEY MTR SLVR 16FR STAT (SET/KITS/TRAYS/PACK) IMPLANT
YANKAUER SUCT BULB TIP NO VENT (SUCTIONS) ×4 IMPLANT

## 2020-09-12 NOTE — Anesthesia Preprocedure Evaluation (Addendum)
Anesthesia Evaluation  Patient identified by MRN, date of birth, ID band Patient awake    Reviewed: Allergy & Precautions, NPO status , Patient's Chart, lab work & pertinent test results  History of Anesthesia Complications Negative for: history of anesthetic complications  Airway Mallampati: III  TM Distance: >3 FB Neck ROM: Full    Dental  (+) Dental Advisory Given   Pulmonary Current Smoker and Patient abstained from smoking.,    Pulmonary exam normal        Cardiovascular hypertension, Pt. on medications Normal cardiovascular exam   '21 TTE - EF >75%. Moderate concentric left ventricular  hypertrophy. Indeterminate diastolic filling due to E-A fusion. Mild aortic valve sclerosis is present, without AS.    Neuro/Psych CVA (wheelchair bound, right hemiplegia, aphasia), Residual Symptoms negative psych ROS   GI/Hepatic Neg liver ROS, hiatal hernia,  NGT in place    Endo/Other  Morbid obesity  Renal/GU ARF and CRFRenal disease     Musculoskeletal negative musculoskeletal ROS (+)   Abdominal (+) + obese,   Peds  Hematology  (+) anemia ,   Anesthesia Other Findings Covid test negative 08/17/20   Reproductive/Obstetrics                            Anesthesia Physical Anesthesia Plan  ASA: IV  Anesthesia Plan: General   Post-op Pain Management:    Induction: Intravenous  PONV Risk Score and Plan: 4 or greater and Treatment may vary due to age or medical condition, Ondansetron, Dexamethasone and Midazolam  Airway Management Planned: Oral ETT  Additional Equipment: None  Intra-op Plan:   Post-operative Plan: Possible Post-op intubation/ventilation  Informed Consent: I have reviewed the patients History and Physical, chart, labs and discussed the procedure including the risks, benefits and alternatives for the proposed anesthesia with the patient or authorized representative who  has indicated his/her understanding and acceptance.   Patient has DNR.  Discussed DNR with patient and Suspend DNR.   Dental advisory given  Plan Discussed with: CRNA and Anesthesiologist  Anesthesia Plan Comments:        Anesthesia Quick Evaluation

## 2020-09-12 NOTE — Consult Note (Signed)
NAME:  Cristina Jordan, MRN:  [id number], DOB:  10/10/1961, LOS: 27 ADMISSION DATE:  08/23/2020, CONSULTATION DATE:  10/1 REFERRING MD:  Dr. Rebeca Alert, CHIEF COMPLAINT:  Post-op vent   Brief History   59 yo wheelchair bound female presented to ER on 9/04 with weakness, diarrhea and hypoglycemia after having dental extraction.  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  Found to have colitis and renal failure.  Started on ABx and seen by nephrology.  GI consulted to assess diarrhea and abdominal pain.  EGD showed non-bleeding duodenal ulcer, and flex sig showed diverticulosis.  Had persistent nausea and vomiting, but had initial improvement with reglan.  However, vomiting recurred and found to have SBO.  Surgery consulted.   She had exploratory laparotomy, lysis of adhesions, and incisional hernia repair with mesh on 10/01.  She required pressors and remained on vent after surgery.  PCCM assumed care in ICU.  Hx from chart and medical team.  Past Medical History  HTN, CVA, CKD 3b  Significant Hospital Events   9/04 admit, nephrology consulted 9/06 palliative care consulted 9/07 DNR 9/10 EGD, Flex sig 9/11 Rt IJ DL power line by IR, Nephrology sign off 9/13 Gyn consulted for vaginal bleeding 9/20 recurrent nausea and vomiting 9/21 chest pain 9/23 start reglan for refractory nausea 9/24 transfuse 1 unit PRBC, GI sign off 9/30 recurrent vomiting, found to have SBO, surgery consulted 10/1 exploratory laparotomy, lysis of adhesions, and incisional hernia repair with mesh, transfuse 1 unit PRBC  Consults:  Nephrology Palliative care Gastroenterology Surgery  Procedures:  Rt IJ DL power line 9/11 >> Lt brachial arterial line 10/1 >> ETT 10/1 >>   Significant Diagnostic Tests:  9/4 CT head - Area of hypoattenuation in the periventricular white matter of the left frontal lobe extends into the left basal ganglia/external capsule. This may represent a chronic infarct 9/4 CT  abdomen > Suggestion of diffuse colonic wall thickening with pericolonic edema involving the descending and sigmoid colon, suspicious for colitis. This may be infectious or inflammatory. Distal colonic diverticulosis without focal diverticulitis. Right paramidline ventral abdominal wall hernia contains short segment of small bowel without obstruction or inflammatory change. Left paramidline ventral abdominal wall hernia at the same level contains only fat. 9/4 US renal > Mildly increased echogenicity of both kidneys, compatible with medical renal disease. 9/10 endoscopy > 4cm hiatal hernia, tortuous esophagus, erythematous mucosa in the stomach. Non-bleeding duodenal ulcers, duodenitis.  9/10 flex sig >diverticulosis in the recto-sigmoid colon and in sigmoid colon.  9/21 CT abd > Subtle ground-glass pulmonary infiltrate within the visualized right lower lobe, new since prior examination, suspicious for acute infection or edema. Persistent, though improving, long segment inflammatory change involving the descending and sigmoid colon. Abnormal thickening of the endometrium. Development of mild ascites, diffuse body wall edema, and small left pleural effusion in keeping with changes of a progressive anasarca. 9/23 Dg esophagus > Esophageal dysmotility. 9/30 CT abdomen > Infraumbilical hernia containing short segment of small bowel may be causing proximal obstruction. Persistent circumferential colonic wall thickening extending from the splenic flexure through the sigmoid, consistent with colitis.  Micro Data:  GI pathogen PCR 9/05 >> negative C diff PCR 9/05 >> negative GI pathogen PCR 9/25 >> negative  Antimicrobials:  Metronidazole 9/5 > 9/6  Interim history/subjective:    Objective   Blood pressure 121/66, pulse 66, temperature (!) 93.9 F (34.4 C), resp. rate 18, height 5' 6.6" (1.692 m), weight (!) 139.3 kg, SpO2  100 %.    Vent Mode: PRVC FiO2 (%):  [50 %] 50 % Set Rate:  [16 bmp] 16  bmp Vt Set:  [480 mL] 480 mL Pressure Support:  [5 cmH20] 5 cmH20 Plateau Pressure:  [21 cmH20] 21 cmH20   Intake/Output Summary (Last 24 hours) at 10/11/2020 1840 Last data filed at 09/26/2020 1830 Gross per 24 hour  Intake 3977.88 ml  Output 1350 ml  Net 2627.88 ml   Filed Weights   09/06/20 2018 09/07/20 2212 10/04/2020 1408  Weight: (!) 143.8 kg (!) 139.3 kg (!) 139.3 kg    General - sedated Eyes - pupils reactive ENT - ETT in place Cardiac - regular rate/rhythm, no murmur Chest - decreased BS, no wheezing Abdomen - wound dressing clean Extremities - no cyanosis, clubbing, or edema Skin - no rashes Neuro - RASS -3   Resolved Hospital Problem list     Assessment & Plan:   SBO in setting of ventral hernia and adhesions s/p laparotomy 10/01. - post op care, nutrition per CCS  Hypotension from hypovolemia and sedation. - continue IV fluids - pressors to keep MAP > 65  Post-operative mechanical ventilation requirement. - assess for vent weaning in AM of 10/02 - goal SpO2 > 92% - f/u CXR, ABG - increased RR to 18  CKD 3b. Non anion gap metabolic acidosis. - HCO3 in IV fluid - f/u BMET, ABG - monitor urine outpt  Anemia of critical illness and chronic disease. - f/u CBC - transfuse for Hb < 7 or significant bleeding  Duodenal ulcer w/o hemorrhage. - continue protonix bid  Acute metabolic encephalopathy in setting of renal failure, SBO. Hx of CVA with Rt sided weakness and dysarthria. - RASS goal -1  Hx of HTN. - hold outpt norvasc  Best practice:  Diet: NPO DVT prophylaxis: SCDs GI prophylaxis: Protonix Mobility: bed rest Code Status: DNR Disposition: ICU  Labs    CMP Latest Ref Rng & Units 09/29/2020 10/12/2020 09/11/2020  Glucose 70 - 99 mg/dL - 107(H) 102(H)  BUN 6 - 20 mg/dL - 33(H) 30(H)  Creatinine 0.44 - 1.00 mg/dL - 3.33(H) 3.12(H)  Sodium 135 - 145 mmol/L 139 134(L) 135  Potassium 3.5 - 5.1 mmol/L 3.9 4.2 4.4  Chloride 98 - 111 mmol/L -  109 110  CO2 22 - 32 mmol/L - 14(L) 13(L)  Calcium 8.9 - 10.3 mg/dL - 7.3(L) 7.4(L)  Total Protein 6.5 - 8.1 g/dL - - 5.3(L)  Total Bilirubin 0.3 - 1.2 mg/dL - - 0.6  Alkaline Phos 38 - 126 U/L - - 81  AST 15 - 41 U/L - - 26  ALT 0 - 44 U/L - - 28    CBC Latest Ref Rng & Units 10/06/2020 09/16/2020 09/11/2020  WBC 4.0 - 10.5 K/uL - 7.1 8.0  Hemoglobin 12.0 - 15.0 g/dL 7.5(L) 7.7(L) 7.7(L)  Hematocrit 36 - 46 % 22.0(L) 24.1(L) 24.6(L)  Platelets 150 - 400 K/uL - 252 278    ABG    Component Value Date/Time   PHART 7.299 (L) 09/13/2020 1732   PCO2ART 32.4 10/06/2020 1732   PO2ART 263 (H) 09/16/2020 1732   HCO3 16.1 (L) 10/09/2020 1732   TCO2 17 (L) 10/06/2020 1732   ACIDBASEDEF 10.0 (H) 09/15/2020 1732   O2SAT 100.0 10/06/2020 1732    CBG (last 3)  Recent Labs    09/11/20 0722 09/11/20 1655 09/13/2020 0043  GLUCAP 90 97 94     Review of Systems:   Unable to obtain.  Past Medical History  She,  has a past medical history of Hypertension and Stroke ([place]).   Surgical History    Past Surgical History:  Procedure Laterality Date  . BIOPSY  08/20/2020   Procedure: BIOPSY;  Surgeon: Rush Landmark Telford Nab., MD;  Location: [facility];  Service: Gastroenterology;;  EGD and COLON  . CHOLECYSTECTOMY    . ESOPHAGOGASTRODUODENOSCOPY (EGD) WITH PROPOFOL N/A 09/05/2020   Procedure: ESOPHAGOGASTRODUODENOSCOPY (EGD) WITH PROPOFOL;  Surgeon: Rush Landmark Telford Nab., MD;  Location: [facility];  Service: Gastroenterology;  Laterality: N/A;  . FLEXIBLE SIGMOIDOSCOPY N/A 09/01/2020   Procedure: FLEXIBLE SIGMOIDOSCOPY;  Surgeon: Irving Copas., MD;  Location: [facility];  Service: Gastroenterology;  Laterality: N/A;  . IR FLUORO GUIDE CV LINE LEFT  08/23/2020  . IR US GUIDE VASC ACCESS LEFT  08/23/2020     Social History   reports that she has been smoking cigarettes. She started smoking about 12 years ago. She has a 1.20 pack-year smoking history. She does not have any  smokeless tobacco history on file. She reports previous alcohol use. She reports that she does not use drugs.   Family History   Her family history is not on file.   Allergies No Known Allergies   Home Medications  Prior to Admission medications   Medication Sig Start Date End Date Taking? Authorizing Provider  amoxicillin (AMOXIL) 500 MG capsule Take 500 mg by mouth 3 (three) times daily. 08/12/20  Yes [provider]  HYDROcodone-acetaminophen (NORCO) 10-325 MG tablet Take 1 tablet by mouth every 6 (six) hours as needed (pain).  08/12/20  Yes [provider]  OVER THE COUNTER MEDICATION Take 2 tablets by mouth daily as needed (pain). "tylenol migraine"   Yes [provider]  amLODipine (NORVASC) 5 MG tablet Take 1 tablet (5 mg total) by mouth daily. Patient not taking: Reported on 05/27/2020 06/14/18   Horton, Barbette Hair, MD     Critical care time: 41 minutes   Chesley Mires, MD [place] Pager - [phone] 09/28/2020, 6:40 PM

## 2020-09-12 NOTE — Progress Notes (Signed)
Subjective:   No acute events overnight. Patient went for CT abdomen pelvis as below.  Surgeons examined patient prior to our arrival to the patient's room on rounding. They would like to take the patient to the OR today for hernia causing bowel entrapment.  On evaluation at bedside this morning, patient notes abdominal pain has improved, but endorses constant nausea with mulitple episodes of vomiting. She expresses understanding of the plan for surgery.  Objective:  Vital signs in last 24 hours: Vitals:   09/11/20 0924 09/11/20 1657 09/11/20 2034 10/05/2020 0432  BP: 139/83 (!) 142/87 (!) 146/82 130/72  Pulse: 89 83 86 84  Resp: 18 18 16 18   Temp: 98.1 F (36.7 C) 97.7 F (36.5 C) 97.7 F (36.5 C) 97.7 F (36.5 C)  TempSrc: Oral Oral Oral Oral  SpO2: 100% 100% 100% 100%  Weight:      Height:       General: Patient is morbidly obese. She appears tired and uncomfortable, in mild distress secondary to nausea. HENT: Mucus membranes are moist. Edentulous.  Respiratory: Anterior lung sounds decreased but CTA, bilaterally. Patient is tachypneic without increased work of breathing.  Cardiovascular: Rate is tachycardic. Rhythm is regular. No murmurs, rubs, or gallops.  Neurological: Patient has R-sided hemiparesis. Chronic slurred speech. Patient is somnolent. Abdominal: Soft with mild TTP. Difficult to appreciate distension secondary to body habitus.  Genitourinary: Foley is in place, draining urine. Skin: Warm and dry.   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  Ventral incisional hernias Subacute Colitis, likely Ischemic  Chronic Duodenitis/Gastritis Likely Esophageal Dysmotility EGD biopsies suggestive of chronic peptic duodenitis, chronic gastritis with reactive esophagitis, and flex sigmoidoscopy consistent with likely acute, infectious, self-limited colitis. Repeat CT abdomen / pelvis 09/02/20 showed improved colitis that may be ischemic in nature with gastric inflammation. Patient's nausea and vomiting continue, with diffuse abdominal pain and throat pain but resolved diarrhea. Esophogram showed likely mid-distal esophageal dysmotility which could be contributing to her persistent symptoms in addition to duodenitis, gastritis, and esophagitis. KUB showed possible ileus, CT abdomen pelvis showed hernia that appeared entrapped. Surgery consulted for evaluation and recommend surgery patient NPO, NG tube placement, exploratory laparotomy with possible bowel resection, ventral hernia repair, and possible mesh placement.  - NPO for surgery today - GI have signed off given improvement - Continue PPI IV BID for 4 weeks then decrease to 1x daily for 8 weeks followed by EGD in 3 months to document healing, per GI. - Avoid NSAIDs - Continue metoclopramide IV  - EKG today showed worsening QTc (493); repeating morning EKG - consider esophageal manometry and/or repeat EGD as outpatient  Improving AKI on likely Chronic Renal Failure stage IIIb  Chronic, Compensated Anion Gap Metabolic Acidosis likely 2/2  RTA type I Creatinine on arrival was 10.23 (up from 1.7 in 2019), BUN 73, GFR 4. Creatinine and BUN stabilizing ~ 3, BUN improved. AKI likely prerenal due to prolonged poor PO  intake and diarrhea. Renal Ultrasound showed increased echogenicity consistent with medical renal disease. Patient appears euvolemic. VBG showed pH 7.367, pCO2 21.9, pO2 38.9, bicarb 12.4. anion gap 11. Urine anion gap of 41. Urine pH 7.0.Unable to tolerate PO medications currently. - Nephrology signed off given stability/improvement; however, patient is a poor dialysis candidate. PT and OT have recommended SNF placement; however, patient has expressed desire to go home.  - HH OT, PT, SLP, and RN ordered, and hopeful for PCS early this week for discharge home to family if this is in line with patient and family's wishes - DME orders placed for wheelchair and lift - Continue IV NaHCO3 169mL/hr (1L x 3) with plan to switch to oral once HCO3 improves to ~18 if patient able to tolerate PO intake  - Monitor Strict I&O and consider starting NS if needed - Continue to monitor daily renal function  Electrolyte Abnormalities with Moderate Malnutrition Patient has only been consuming broth for at least 2 weeks at home with N/V/D. Nutrition endorses 22% weight loss over past 3 months.  Potassium on arrival was less than 2, corrected calcium was 6.8, magnesium was 1.4 likely secondary to poor p.o. intake, nausea vomiting and diarrhea. Prolonged QTc. IJ tunnel cath placed 9/48/54 without complication. - Potassium improved  - Albumin low, encouraged PO intake as tolerated - Daily monitoring   Stable Normocytic Anemia Likely due to vaginal bleeding and worsening CKD. Her nausea may be 2/2 undiagnosed gynecological malignancy. Hbg now stable s/p transfusions. Patient denies known bleeding today.  - OB-GYN consulted; patient will be scheduled to follow up OP for endometrial Bx at [facility] [Dr. Emeterio Reeve will message the office] if kidney function continues to improve - Patient has not been receiving Megestrol 40mg  BID regularly; will encourage regular dosing once able to tolerate  - Hemoglobin stable  ~8; daily CBC's - Will hold off on iron for now given GI inflammation though will likely require this vs. EPO in future   Hypertension  Discontinued amlodipine given she is on Diltiazem.  - blood pressures remain elevated - If diltiazem does not improve dysmotility / vomiting, will restart amlodipine   Prior to Admission Living Arrangement: Home  Anticipated Discharge Location: Home with HH PT, OT, SLP, RN hopeful PCS, wheelchair and lift. Will need to discuss further with daughter. Barriers to Discharge: Decreased PO intake; Vomiting  Code Status: DNR/DNI Diet: NPO IVF: None DVT PPx: SCDs given vaginal bleeding  Alexandria Lodge, MD 09/27/2020, 6:07 AM Pager: [phone] After 5pm on weekdays and 1pm on weekends: On Call pager [phone]

## 2020-09-12 NOTE — Anesthesia Procedure Notes (Signed)
Arterial Line Insertion Start/End10/23/2021 3:39 PM, 10/10/2020 3:50 PM Performed by: Audry Pili, MD, anesthesiologist  Patient location: OR. Preanesthetic checklist: patient identified, IV checked, monitors and equipment checked, pre-op evaluation, timeout performed and anesthesia consent Lidocaine 1% used for infiltration and patient sedated Left, brachial was placed Catheter size: 20 G Hand hygiene performed   Attempts: 2 (Previous attempts at left radial unsuccessful) Procedure performed using ultrasound guided technique. Ultrasound Notes:anatomy identified, needle tip was noted to be adjacent to the nerve/plexus identified, no ultrasound evidence of intravascular and/or intraneural injection and image(s) printed for medical record Following insertion, dressing applied and Biopatch. Post procedure assessment: unchanged and normal  Patient tolerated the procedure well with no immediate complications.

## 2020-09-12 NOTE — Op Note (Signed)
Preoperative diagnosis: SBO secondary to possible incarcerated ventral incisional hernias Postoperative diagnosis: Richters hernia, incisional Procedure: 1. Exploratory laparotomy 2. Lysis of adhesions times one hour 3. Primary incisional hernia repair with mesh Surgeon: Dr Serita Grammes Asst: Melina Modena, PA-C Anesthesia general EBL: 50 cc Specimens none Drains none Complications none Sponge and needle count correct times two  Indications: 53 yof with significant pmh including open chole and elap who has been admitted since 08/17/2020.  She has n/v and has known ventral incisional hernias. She has elevated lactate now and CT scan that shows likely incarcerated incisional hernia with SBO. I discussed going to OR for laparotomy.  Procedure: After informed consent obtained she was taken to the OR. She was given antibiotics.  She had SCDs in place. She was placed under general anesthesia without complication.  She was prepped and draped in standard sterile surgical fashion.  A timeout was performed.  I removed her old scar. I then entered into her abdomen superiorly without injury.  I spent an hour lysing adhesions from the small bowel to the abdominal wall and the colon to the abdominal wall.  The colon is very thin walled and ill appearing. There were multiple small hernias present at the midline. There were two in particular that were Richter type hernias with one containing small bowel and one containing large bowel. The bowel was all viable so I did not resect bowel. These hernias were reduced. Most of the bowel was dilated. I elected not to release all the adhesions due to the fact that I had addressed the problem and I think this would have been much more morbid for her.  I did not have any bowel injuries.  I released all the hernias and made them part of the incision. I then obtained hemostasis. I then closed the abdominal wall after removing a lot of HO from the wound.  I closed with #1 looped  PDS with interrupted novafil and 3 external nylon retention sutures.  A dressing was placed.  She will remain intubated and will be transferred to ICU critically ill.

## 2020-09-12 NOTE — Consult Note (Addendum)
[facility] Surgery Consult Note  [name] 1961/05/26  [id number].    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 GI and nephrology were consulted. 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 GI did upper endoscopy and flex sig 9/10 significant for duodenal ulcers (negative for h.pylori). Flex sig with diverticulosis and hemorrhoids. Patient has also had post-menopausal vaginal bleeding this admission, found to have thickened endometrium, plan is for estrogen therapy with outpatient follow up with GYN for endometrial Bx. Due to persistent nausea/vomiting with metabolic acidosis CT of the abdomen was repeated significant for ventral hernia causing SBO. Lactate is 3. CCS has been asked to evaluate.  During my exam, patient reports diarrhea and nausea/vomiting dating back to before admission and ongoing nausea and vomiting during her hospitalization. She reports history of cholecystectomy but unsure why she has a midline abdominal scar (per chart review a surgical sponge was left behind during her cholecystectomy so she had to have a laparotomy to retreive the sponge). At baseline she lives at home with her daughter and grandchildren. She is wheelchair bound but was performing ADL's at home with help in her wheelchair before the weakness, nausea, vomiting started. Reports smoking off and on for 12 years - states she only smokes about a cigarette per day. Denies illicit drug use.    ROS: Review of Systems  All other systems reviewed and are negative.   History reviewed. No pertinent family history.  Past Medical History:   Diagnosis Date   Hypertension    Stroke [facility])     Past Surgical History:  Procedure Laterality Date   BIOPSY  08/19/2020   Procedure: BIOPSY;  Surgeon: Rush Landmark Telford Nab., MD;  Location: [place];  Service: Gastroenterology;;  EGD and COLON   CHOLECYSTECTOMY     ESOPHAGOGASTRODUODENOSCOPY (EGD) WITH PROPOFOL N/A 08/15/2020   Procedure: ESOPHAGOGASTRODUODENOSCOPY (EGD) WITH PROPOFOL;  Surgeon: Irving Copas., MD;  Location: [place];  Service: Gastroenterology;  Laterality: N/A;   FLEXIBLE SIGMOIDOSCOPY N/A 09/08/2020   Procedure: FLEXIBLE SIGMOIDOSCOPY;  Surgeon: Rush Landmark Telford Nab., MD;  Location: [place];  Service: Gastroenterology;  Laterality: N/A;   IR FLUORO GUIDE CV LINE LEFT  08/23/2020   IR US GUIDE VASC ACCESS LEFT  08/23/2020    Social History:  reports that she has never smoked. She has never used smokeless tobacco. She reports previous alcohol use. She reports that she does not use drugs.  Allergies: No Known Allergies  Medications Prior to Admission  Medication Sig Dispense Refill   amoxicillin (AMOXIL) 500 MG capsule Take 500 mg by mouth 3 (three) times daily.     HYDROcodone-acetaminophen (NORCO) 10-325 MG tablet Take 1 tablet by mouth every 6 (six) hours as needed (pain).      OVER THE COUNTER MEDICATION Take 2 tablets by mouth daily as needed (pain). "tylenol migraine"     amLODipine (NORVASC) 5 MG tablet Take 1 tablet (5 mg total) by mouth daily. (Patient not taking: Reported on 05/27/2020) 30 tablet 0    Blood pressure 132/69, pulse 81, temperature (!) 97.5 F (36.4 C), temperature source Oral, resp. rate 18, height 5' 6.5" (1.689 m), weight (!) 139.3  kg, SpO2 100 %. Physical Exam: Constitutional: NAD; chronically ill appearing female with facial droop, aphasia Eyes: Moist conjunctiva; no lid lag; anicteric; PERRL Neck: Trachea midline; no thyromegaly Lungs: Normal respiratory effort; no tactile fremitus CV: RRR; no  palpable thrills; no pitting edema GI: Abd soft, obese, minimally tender, previous R subcostal incision and midline incision appear well-healing. Hernias are not easily palpable through abdominal wall. No peritonitis. MSK: symmetrical; no clubbing/cyanosis Psychiatric: Appropriate affect; alert and oriented x3 Lymphatic: No palpable cervical or axillary lymphadenopathy Neuro: R hemiplegia - has sensation RUE/RLE but cannot move RLE/RUE. Moves LUE/LLE spontaneously.   Results for orders placed or performed during the hospital encounter of 09/05/2020 (from the past 48 hour(s))  Glucose, capillary     Status: None   Collection Time: 09/10/20  4:39 PM  Result Value Ref Range   Glucose-Capillary 97 70 - 99 mg/dL    Comment: Glucose reference range applies only to samples taken after fasting for at least 8 hours.  Blood gas, venous     Status: Abnormal   Collection Time: 09/10/20  6:00 PM  Result Value Ref Range   FIO2 21.00    pH, Ven 7.353 7.25 - 7.43   pCO2, Ven 24.2 (L) 44 - 60 mmHg   pO2, Ven 40.8 32 - 45 mmHg   Bicarbonate 13.1 (L) 20.0 - 28.0 mmol/L   Acid-base deficit 11.4 (H) 0.0 - 2.0 mmol/L   O2 Saturation 75.6 %   Patient temperature 37.0     Comment: Performed at [facility] [address] [place], [place] [address]  Glucose, capillary     Status: None   Collection Time: 09/11/20 12:15 AM  Result Value Ref Range   Glucose-Capillary 91 70 - 99 mg/dL    Comment: Glucose reference range applies only to samples taken after fasting for at least 8 hours.  Magnesium     Status: None   Collection Time: 09/11/20  4:08 AM  Result Value Ref Range   Magnesium 2.0 1.7 - 2.4 mg/dL    Comment: Performed at [facility], [place] [address], [place], [place] [address]  CBC     Status: Abnormal   Collection Time: 09/11/20  4:08 AM  Result Value Ref Range   WBC 8.0 4.0 - 10.5 K/uL   RBC 2.65 (L) 3.87 - 5.11 MIL/uL   Hemoglobin 7.7 (L) 12.0 - 15.0 g/dL   HCT 24.6 (L) 36 - 46 %    MCV 92.8 80.0 - 100.0 fL   MCH 29.1 26.0 - 34.0 pg   MCHC 31.3 30.0 - 36.0 g/dL   RDW 17.4 (H) 11.5 - 15.5 %   Platelets 278 150 - 400 K/uL   nRBC 0.0 0.0 - 0.2 %    Comment: Performed at [facility], [place] [address] [place], [place] [address]  Phosphorus     Status: None   Collection Time: 09/11/20  4:29 AM  Result Value Ref Range   Phosphorus 3.0 2.5 - 4.6 mg/dL    Comment: Performed at [facility] [address], [place], [place] [address]  Comprehensive metabolic panel     Status: Abnormal   Collection Time: 09/11/20  4:29 AM  Result Value Ref Range   Sodium 135 135 - 145 mmol/L   Potassium 4.4 3.5 - 5.1 mmol/L   Chloride 110 98 - 111 mmol/L   CO2 13 (L) 22 - 32 mmol/L   Glucose, Bld 102 (H) 70 -  99 mg/dL    Comment: Glucose reference range applies only to samples taken after fasting for at least 8 hours.   BUN 30 (H) 6 - 20 mg/dL   Creatinine, Ser 3.12 (H) 0.44 - 1.00 mg/dL   Calcium 7.4 (L) 8.9 - 10.3 mg/dL   Total Protein 5.3 (L) 6.5 - 8.1 g/dL   Albumin 2.0 (L) 3.5 - 5.0 g/dL   AST 26 15 - 41 U/L   ALT 28 0 - 44 U/L   Alkaline Phosphatase 81 38 - 126 U/L   Total Bilirubin 0.6 0.3 - 1.2 mg/dL   GFR calc non Af Amer 16 (L) >60 mL/min   GFR calc Af Amer 18 (L) >60 mL/min   Anion gap 12 5 - 15    Comment: Performed at [facility] [address] [place], [place] [address]  Glucose, capillary     Status: None   Collection Time: 09/11/20  7:22 AM  Result Value Ref Range   Glucose-Capillary 90 70 - 99 mg/dL    Comment: Glucose reference range applies only to samples taken after fasting for at least 8 hours.  Glucose, capillary     Status: None   Collection Time: 09/11/20  4:55 PM  Result Value Ref Range   Glucose-Capillary 97 70 - 99 mg/dL    Comment: Glucose reference range applies only to samples taken after fasting for at least 8 hours.  Glucose, capillary     Status: None   Collection Time: 09/24/2020 12:43 AM  Result Value Ref Range    Glucose-Capillary 94 70 - 99 mg/dL    Comment: Glucose reference range applies only to samples taken after fasting for at least 8 hours.  Magnesium     Status: None   Collection Time: 09/14/2020  1:15 AM  Result Value Ref Range   Magnesium 1.8 1.7 - 2.4 mg/dL    Comment: Performed at [facility] [address], [place], [place] [address]  CBC     Status: Abnormal   Collection Time: 10/05/2020  1:15 AM  Result Value Ref Range   WBC 7.1 4.0 - 10.5 K/uL   RBC 2.65 (L) 3.87 - 5.11 MIL/uL   Hemoglobin 7.7 (L) 12.0 - 15.0 g/dL   HCT 24.1 (L) 36 - 46 %   MCV 90.9 80.0 - 100.0 fL   MCH 29.1 26.0 - 34.0 pg   MCHC 32.0 30.0 - 36.0 g/dL   RDW 17.4 (H) 11.5 - 15.5 %   Platelets 252 150 - 400 K/uL   nRBC 0.0 0.0 - 0.2 %    Comment: Performed at [facility], [place] [address], [place], [place] [address]  Renal function panel     Status: Abnormal   Collection Time: 10/09/2020  1:15 AM  Result Value Ref Range   Sodium 134 (L) 135 - 145 mmol/L   Potassium 4.2 3.5 - 5.1 mmol/L   Chloride 109 98 - 111 mmol/L   CO2 14 (L) 22 - 32 mmol/L   Glucose, Bld 107 (H) 70 - 99 mg/dL    Comment: Glucose reference range applies only to samples taken after fasting for at least 8 hours.   BUN 33 (H) 6 - 20 mg/dL   Creatinine, Ser 3.33 (H) 0.44 - 1.00 mg/dL   Calcium 7.3 (L) 8.9 - 10.3 mg/dL   Phosphorus 3.1 2.5 - 4.6 mg/dL   Albumin 1.9 (L) 3.5 - 5.0 g/dL   GFR calc non Af  Amer 15 (L) >60 mL/min   GFR calc Af Amer 17 (L) >60 mL/min   Anion gap 11 5 - 15    Comment: Performed at [facility] [address], [place], [place] [address]  Lactic acid, plasma     Status: Abnormal   Collection Time: 10/07/2020  5:00 AM  Result Value Ref Range   Lactic Acid, Venous 3.2 (HH) 0.5 - 1.9 mmol/L    Comment: CRITICAL RESULT CALLED TO, READ BACK BY AND VERIFIED WITH: MOUHAMAD,A RN @ [phone] 10/12/2020 LEONARD,A Performed at [facility], [place] [address], [place], [place] [address]   Lactic acid, plasma      Status: Abnormal   Collection Time: 09/29/2020  8:45 AM  Result Value Ref Range   Lactic Acid, Venous 2.9 (HH) 0.5 - 1.9 mmol/L    Comment: CRITICAL VALUE NOTED.  VALUE IS CONSISTENT WITH PREVIOUSLY REPORTED AND CALLED VALUE. Performed at [facility], [place] [address] [place], [place] [address]    CT ABDOMEN PELVIS WO CONTRAST  Result Date: 09/11/2020 CLINICAL DATA:  Bowel obstruction suspected Patient with history of colitis and diarrhea now days without bowel movement and possible ileus on XR EXAM: CT ABDOMEN AND PELVIS WITHOUT CONTRAST TECHNIQUE: Multidetector CT imaging of the abdomen and pelvis was performed following the standard protocol without IV contrast. COMPARISON:  CT 09/02/2020 FINDINGS: Lower chest: Small left pleural effusion with adjacent compressive atelectasis. Pleural effusion is unchanged from prior exam. Heart is normal in size. Motion artifact limits basilar assessment. Hepatobiliary: Limited by motion artifact, soft tissue attenuation from habitus, and streak artifact from arms down positioning. Hepatic steatosis. No obvious focal abnormality, detailed assessment is limited. Cholecystectomy without biliary dilatation. Pancreas: Equivocal edema adjacent to the pancreatic head, series 3, image 30. Alternatively this may be related to motion artifact. There is no ductal dilatation or peripancreatic collection. Spleen: Normal in size without focal abnormality. Adrenals/Urinary Tract: No adrenal nodule. Bilateral renal parenchymal thinning. No hydronephrosis. Portions of the left kidney are obscured by dense contrast in the adjacent bowel. No renal calculi. Decompressed ureters without evidence of ureteral stone. Urinary bladder is decompressed. Foley catheter in place. Stomach/Bowel: There is circumferential colonic wall thickening extending from the splenic flexure through the sigmoid. The degree of colonic wall thickening has slightly increased from prior exam. The more proximal  colon is air distended. No wall thickening of the air distended portions. Richter hernia of the mid transverse colon to the right on the left of the umbilicus with herniation of the anti mesenteric border of transverse colon, series 3 images 56 and 50 no associated wall thickening or obstruction. There is liquid stool in the cecum and ascending colon. Normal appendix. Ventral abdominal wall hernia in the lower abdominal wall to the right of midline contains short segment of small bowel, series 3, image 65, there is no small bowel wall thickening. More proximal small bowel are slightly dilated and fluid-filled, small-bowel distention of 4.4 cm. The distal small bowel loops are decompressed. Air fluid distention of the stomach. There is no gastric or small bowel wall thickening or inflammatory change. There is no bowel pneumatosis. Vascular/Lymphatic: No portal venous or mesenteric gas. Aortic atherosclerosis. No aortic aneurysm. There is no bulky abdominopelvic adenopathy. Reproductive: Heterogeneously enlarged uterus with fibroids, including a calcified fibroid in the left lower uterine body. There is questionable endometrial thickening, though not well assessed on the current exam. Other: No free air or ascites. Ventral abdominal wall hernias as  described above. Musculoskeletal: Diffuse degenerative change in the spine. There are no acute or suspicious osseous abnormalities. IMPRESSION: 1. Infraumbilical hernia containing short segment of small bowel may be causing proximal obstruction. 2. Persistent circumferential colonic wall thickening extending from the splenic flexure through the sigmoid, consistent with colitis. The degree of colonic wall thickening has slightly increased from prior exam. There is gaseous distension of the more proximal colon. 3. 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 No associated wall thickening. No associated  colonic obstruction. 4. Prominent colonic diverticulosis without focal diverticulitis. 5. Equivocal edema adjacent to the pancreatic head, which may be related to motion artifact or pancreatitis. Recommend correlation with pancreatic enzymes. 6. Hepatic steatosis. 7. Heterogeneously enlarged uterus with fibroids, including a calcified fibroid in the left lower uterine body. There is questionable endometrial thickening, though not well assessed on the current exam. Recommend nonemergent pelvic ultrasound for characterization on an elective outpatient basis. 8. Small left pleural effusion with adjacent compressive atelectasis, unchanged from prior exam. Aortic Atherosclerosis (ICD10-I70.0). Electronically Signed   By: Keith Rake M.D.   On: 09/11/2020 16:58   DG Abd 1 View  Result Date: 09/11/2020 CLINICAL DATA:  High-pitched bowel sounds EXAM: ABDOMEN - 1 VIEW COMPARISON:  None. FINDINGS: Scattered large and small bowel gas is noted. No definitive obstructive changes are seen. These findings likely represent a diffuse colonic ileus. No free air is seen. No bony abnormality is noted. IMPRESSION: Gaseous distended colon likely representing generalized colonic ileus. No other focal abnormality is noted. Electronically Signed   By: Inez Catalina M.D.   On: 09/11/2020 14:27   Assessment/Plan Postmenopausal vaginal bleeding - GYN follow up Obesity  PMH CVA with R hemiplegia Chronic debility due to above Iron deficiency anemia  Chronic gastritis/duodenitis  - on BID PPI, GI follow up Diarrhea/colitis Hiatal hernia   Renal failure Metabolic acidosis Nausea/vomiting  SBO due to ventral incisional hernias  While patient does have esophageal dysmotility and metabolic derangements that could contribute to her nausea and vomiting, I think her SBO is the main cause of this. I could not palpate or reduce her hernias on exam. Non-obstructive bowel containing hernias were appreciable on her CT from 9/4, CT  from yesterday shows more bowel within the hernias with small bowel obstruction. Unfortunately I do not this this will improve non-operatively. I recommend NPO, NG tube placement, exploratory laparotomy, possible bowel resection, ventral hernia repair, possible mesh placement.   The risks of surgery including bleeding, infection, damaging to surrounding structures, need for additional procedures, MI, CVA, respiratory complications, and death were discussed with the patient and she would like to proceed with surgery.   I also called her to daughter to discuss the plan for surgery and answered her questions.    Jill Alexanders, PA-C [facility] Please see Amion for pager number during day hours 7:00am-4:30pm 09/25/2020, 11:20 AM

## 2020-09-12 NOTE — Transfer of Care (Signed)
Immediate Anesthesia Transfer of Care Note  Patient: Cristina Jordan  Procedure(s) Performed: EXPLORATORY LAPAROTOMY (N/A ) Primary Repair Of Incisional Hernia (N/A Abdomen) LYSIS OF ADHESION (N/A Abdomen)  Patient Location: ICU  Anesthesia Type:General  Level of Consciousness: Patient remains intubated per anesthesia plan  Airway & Oxygen Therapy: Patient remains intubated per anesthesia plan and Patient placed on Ventilator (see vital sign flow sheet for setting)  Post-op Assessment: Report given to RN and Post -op Vital signs reviewed and stable  Post vital signs: Reviewed and stable  Last Vitals:  Vitals Value Taken Time  BP 121/66 10/10/2020 1730  Temp    Pulse 64 09/21/2020 1730  Resp 17 09/22/2020 1730  SpO2 100 % 09/22/2020 1730  Vitals shown include unvalidated device data.  Last Pain:  Vitals:   09/25/2020 0919  TempSrc: Oral  PainSc: 0-No pain      Patients Stated Pain Goal: 0 (79/48/01 6553)  Complications: No complications documented.

## 2020-09-12 NOTE — Anesthesia Procedure Notes (Signed)
Procedure Name: Intubation Date/Time: 10/11/2020 2:59 PM Performed by: Janace Litten, CRNA Pre-anesthesia Checklist: Patient identified, Emergency Drugs available, Suction available and Patient being monitored Patient Re-evaluated:Patient Re-evaluated prior to induction Oxygen Delivery Method: Circle System Utilized Preoxygenation: Pre-oxygenation with 100% oxygen Induction Type: IV induction and Rapid sequence Ventilation: Mask ventilation without difficulty Laryngoscope Size: Glidescope and 3 Tube type: Oral Number of attempts: 1 Airway Equipment and Method: Stylet Placement Confirmation: ETT inserted through vocal cords under direct vision,  positive ETCO2 and breath sounds checked- equal and bilateral Secured at: 22 cm Tube secured with: Tape Dental Injury: Teeth and Oropharynx as per pre-operative assessment

## 2020-09-12 DEATH — deceased

## 2020-09-13 DIAGNOSIS — J9601 Acute respiratory failure with hypoxia: Secondary | ICD-10-CM

## 2020-09-13 DIAGNOSIS — R579 Shock, unspecified: Secondary | ICD-10-CM

## 2020-09-13 DIAGNOSIS — D649 Anemia, unspecified: Secondary | ICD-10-CM

## 2020-09-13 LAB — POCT I-STAT 7, (LYTES, BLD GAS, ICA,H+H)
Acid-base deficit: 8 mmol/L — ABNORMAL HIGH (ref 0.0–2.0)
Acid-base deficit: 9 mmol/L — ABNORMAL HIGH (ref 0.0–2.0)
Bicarbonate: 16.8 mmol/L — ABNORMAL LOW (ref 20.0–28.0)
Bicarbonate: 17.3 mmol/L — ABNORMAL LOW (ref 20.0–28.0)
Calcium, Ion: 0.99 mmol/L — ABNORMAL LOW (ref 1.15–1.40)
Calcium, Ion: 1.05 mmol/L — ABNORMAL LOW (ref 1.15–1.40)
HCT: 22 % — ABNORMAL LOW (ref 36.0–46.0)
HCT: 38 % (ref 36.0–46.0)
Hemoglobin: 12.9 g/dL (ref 12.0–15.0)
Hemoglobin: 7.5 g/dL — ABNORMAL LOW (ref 12.0–15.0)
O2 Saturation: 100 %
O2 Saturation: 99 %
Patient temperature: 36.9
Patient temperature: 37.2
Potassium: 3.9 mmol/L (ref 3.5–5.1)
Potassium: 4.1 mmol/L (ref 3.5–5.1)
Sodium: 139 mmol/L (ref 135–145)
Sodium: 139 mmol/L (ref 135–145)
TCO2: 18 mmol/L — ABNORMAL LOW (ref 22–32)
TCO2: 18 mmol/L — ABNORMAL LOW (ref 22–32)
pCO2 arterial: 30.9 mmHg — ABNORMAL LOW (ref 32.0–48.0)
pCO2 arterial: 36.6 mmHg (ref 32.0–48.0)
pH, Arterial: 7.284 — ABNORMAL LOW (ref 7.350–7.450)
pH, Arterial: 7.342 — ABNORMAL LOW (ref 7.350–7.450)
pO2, Arterial: 171 mmHg — ABNORMAL HIGH (ref 83.0–108.0)
pO2, Arterial: 250 mmHg — ABNORMAL HIGH (ref 83.0–108.0)

## 2020-09-13 LAB — CBC
HCT: 25.3 % — ABNORMAL LOW (ref 36.0–46.0)
Hemoglobin: 8.1 g/dL — ABNORMAL LOW (ref 12.0–15.0)
MCH: 28.9 pg (ref 26.0–34.0)
MCHC: 32 g/dL (ref 30.0–36.0)
MCV: 90.4 fL (ref 80.0–100.0)
Platelets: 218 10*3/uL (ref 150–400)
RBC: 2.8 MIL/uL — ABNORMAL LOW (ref 3.87–5.11)
RDW: 17.5 % — ABNORMAL HIGH (ref 11.5–15.5)
WBC: 7.6 10*3/uL (ref 4.0–10.5)
nRBC: 0.4 % — ABNORMAL HIGH (ref 0.0–0.2)

## 2020-09-13 LAB — MAGNESIUM: Magnesium: 1.5 mg/dL — ABNORMAL LOW (ref 1.7–2.4)

## 2020-09-13 LAB — PREALBUMIN: Prealbumin: 5.5 mg/dL — ABNORMAL LOW (ref 18–38)

## 2020-09-13 LAB — GLUCOSE, CAPILLARY
Glucose-Capillary: 121 mg/dL — ABNORMAL HIGH (ref 70–99)
Glucose-Capillary: 119 mg/dL — ABNORMAL HIGH (ref 70–99)
Glucose-Capillary: 134 mg/dL — ABNORMAL HIGH (ref 70–99)
Glucose-Capillary: 127 mg/dL — ABNORMAL HIGH (ref 70–99)

## 2020-09-13 LAB — BASIC METABOLIC PANEL
Anion gap: 15 (ref 5–15)
BUN: 33 mg/dL — ABNORMAL HIGH (ref 6–20)
CO2: 15 mmol/L — ABNORMAL LOW (ref 22–32)
Calcium: 7.2 mg/dL — ABNORMAL LOW (ref 8.9–10.3)
Chloride: 105 mmol/L (ref 98–111)
Creatinine, Ser: 3.25 mg/dL — ABNORMAL HIGH (ref 0.44–1.00)
GFR calc Af Amer: 18 mL/min — ABNORMAL LOW (ref >60)
GFR calc non Af Amer: 15 mL/min — ABNORMAL LOW (ref >60)
Glucose, Bld: 125 mg/dL — ABNORMAL HIGH (ref 70–99)
Potassium: 4 mmol/L (ref 3.5–5.1)
Sodium: 135 mmol/L (ref 135–145)

## 2020-09-13 LAB — MRSA PCR SCREENING: MRSA by PCR: NEGATIVE

## 2020-09-13 LAB — TRIGLYCERIDES: Triglycerides: 105 mg/dL (ref <150)

## 2020-09-13 MED ORDER — LACTATED RINGERS IV BOLUS
500.0000 mL | Freq: Once | INTRAVENOUS | Status: AC
  Administered 2020-09-13: 500 mL via INTRAVENOUS

## 2020-09-13 MED ORDER — VITAL HIGH PROTEIN PO LIQD
1000.0000 mL | ORAL | Status: DC
  Administered 2020-09-13: 1000 mL

## 2020-09-13 MED ORDER — HEPARIN SODIUM (PORCINE) 5000 UNIT/ML IJ SOLN
5000.0000 [IU] | Freq: Three times a day (TID) | INTRAMUSCULAR | Status: DC
  Administered 2020-09-13 – 2020-09-16 (×10): 5000 [IU] via SUBCUTANEOUS
  Filled 2020-09-13 (×11): qty 1

## 2020-09-13 MED ORDER — MAGNESIUM SULFATE 2 GM/50ML IV SOLN
2.0000 g | Freq: Once | INTRAVENOUS | Status: AC
  Administered 2020-09-13: 2 g via INTRAVENOUS
  Filled 2020-09-13: qty 50

## 2020-09-13 NOTE — Progress Notes (Addendum)
NAME:  Cristina Jordan, MRN:  [id number], DOB:  1961/09/02, LOS: 44 ADMISSION DATE:  08/17/2020, CONSULTATION DATE:  10/1 REFERRING MD:  Dr. Rebeca Alert, CHIEF COMPLAINT:  Post-op vent   Brief History   59 yo wheelchair bound female presented to ER on 9/04 with weakness, diarrhea and hypoglycemia after having dental extraction.  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  Found to have colitis and renal failure.  Started on ABx and seen by nephrology.  GI consulted to assess diarrhea and abdominal pain.  EGD showed non-bleeding duodenal ulcer, and flex sig showed diverticulosis.  Had persistent nausea and vomiting, but had initial improvement with reglan.  However, vomiting recurred and found to have SBO.  Surgery consulted.   She had exploratory laparotomy, lysis of adhesions, and incisional hernia repair with mesh on 10/01.  She required pressors and remained on vent after surgery.  PCCM assumed care in ICU.  Hx from chart and medical team.  Past Medical History  HTN, CVA, CKD 3b  Significant Hospital Events   9/04 admit, nephrology consulted 9/06 palliative care consulted 9/07 DNR 9/10 EGD, Flex sig 9/11 Rt IJ DL power line by IR, Nephrology sign off 9/13 Gyn consulted for vaginal bleeding 9/20 recurrent nausea and vomiting 9/21 chest pain 9/23 start reglan for refractory nausea 9/24 transfuse 1 unit PRBC, GI sign off 9/30 recurrent vomiting, found to have SBO, surgery consulted 10/01 exploratory laparotomy, lysis of adhesions, and incisional hernia repair with mesh, transfuse 1 unit PRBC  Consults:  Nephrology Palliative care Gastroenterology Surgery  Procedures:  Rt IJ DL power line 9/11 >> Lt brachial arterial line 10/1 >> ETT 10/1 >>   Significant Diagnostic Tests:  9/4 CT head > Area of hypoattenuation in the periventricular white matter of the left frontal lobe extends into the left basal ganglia/external capsule. This may represent a chronic infarct 9/4  CT abdomen > Suggestion of diffuse colonic wall thickening with pericolonic edema involving the descending and sigmoid colon, suspicious for colitis. This may be infectious or inflammatory. Distal colonic diverticulosis without focal diverticulitis. Right paramidline ventral abdominal wall hernia contains short segment of small bowel without obstruction or inflammatory change. Left paramidline ventral abdominal wall hernia at the same level contains only fat. 9/4 US renal > Mildly increased echogenicity of both kidneys, compatible with medical renal disease. 9/10 endoscopy > 4cm hiatal hernia, tortuous esophagus, erythematous mucosa in the stomach. Non-bleeding duodenal ulcers, duodenitis.  9/10 flex sig > diverticulosis in the recto-sigmoid colon and in sigmoid colon.  9/21 CT abd > Subtle ground-glass pulmonary infiltrate within the visualized right lower lobe, new since prior examination, suspicious for acute infection or edema. Persistent, though improving, long segment inflammatory change involving the descending and sigmoid colon. Abnormal thickening of the endometrium. Development of mild ascites, diffuse body wall edema, and small left pleural effusion in keeping with changes of a progressive anasarca. 9/23 Dg esophagus > Esophageal dysmotility. 9/30 CT abdomen > Infraumbilical hernia containing short segment of small bowel may be causing proximal obstruction. Persistent circumferential colonic wall thickening extending from the splenic flexure through the sigmoid, consistent with colitis.  Micro Data:  GI pathogen PCR 9/05 >> negative C diff PCR 9/05 >> negative GI pathogen PCR 9/25 >> negative  Antimicrobials:  Metronidazole 9/5 > 9/6  Interim history/subjective:  Afebrile  RN reports WUA in progress On vent - PSV 14/5, 40% Remains on neosynephrine gtt I/O 200 ml UOP, 3.5L + in last 24 hours  CBG range 100-140  Objective   Blood pressure (!) 99/54, pulse 84, temperature 98.4 F  (36.9 C), temperature source Rectal, resp. rate 16, height 5' 6.6" (1.692 m), weight (!) 145.6 kg, SpO2 100 %.    Vent Mode: PSV;CPAP FiO2 (%):  [40 %-50 %] 40 % Set Rate:  [16 bmp-18 bmp] 18 bmp Vt Set:  [480 mL] 480 mL Pressure Support:  [5 cmH20-15 cmH20] 15 cmH20 Plateau Pressure:  [15 BHA19-37 cmH20] 15 cmH20   Intake/Output Summary (Last 24 hours) at 09/13/2020 9024 Last data filed at 09/13/2020 0700 Gross per 24 hour  Intake 4739.11 ml  Output 1150 ml  Net 3589.11 ml   Filed Weights   09/07/20 2212 09/22/2020 1408 09/13/20 0300  Weight: (!) 139.3 kg (!) 139.3 kg (!) 145.6 kg   Exam: General: obese adult female lying in bed on vent, critically ill appearing  HEENT: MM pink/moist, ETT, pupils 6mm / briskly reactive  Neuro: sedate on propofol CV: s1s2 RRR, SR 80's, no m/r/g PULM: non-labored on PSV wean, lungs bilaterally clear anterior GI: midline abdominal dressing with old blood on bandage, no indication of acute bleeding Extremities: warm/dry, trace edema  Skin: no rashes or lesions  Resolved Hospital Problem list     Assessment & Plan:   SBO in setting of ventral hernia and adhesions s/p laparotomy 10/01. -post op care, wound care per CCS -begin trickle tube feeding / ok with CCS -follow closely for TF tolerance  Hypotension from hypovolemia and sedation. -continue sodium bicarbonate at 100 ml/hr  -neosynephrine for MAP >65  Post-operative mechanical ventilation requirement. -SBT / WUA daily for possible extubation  -PRVC 8cc/kg as rest mode -wean PEEP / FiO2 for sats > 90% -follow intermittent CXR  CKD 3b. Non anion gap metabolic acidosis. Hypomagnesemia  -continue bicarbonate as above -Trend BMP / urinary output -Replace electrolytes as indicated, judicious Mg+ replacement  -Avoid nephrotoxic agents, ensure adequate renal perfusion  Anemia of critical illness and chronic disease. -trend CBC -transfuse for Hgb <7% or significant bleeding   Duodenal  ulcer w/o hemorrhage. -continue protonix BID   Acute metabolic encephalopathy in setting of renal failure, SBO. Hx of CVA with Rt sided weakness and dysarthria. -PAD protocol with propofol, PRN fentanyl  -RASS Goal 0 to -1 -PT efforts   Hx of HTN. -hold outpatient norvasc  Best practice:  Diet: NPO DVT prophylaxis: SCDs GI prophylaxis: Protonix Mobility: bed rest Code Status: DNR Disposition: ICU  Labs    CMP Latest Ref Rng & Units 09/13/2020 09/13/2020 10/09/2020  Glucose 70 - 99 mg/dL 125(H) - -  BUN 6 - 20 mg/dL 33(H) - -  Creatinine 0.44 - 1.00 mg/dL 3.25(H) - -  Sodium 135 - 145 mmol/L 135 139 139  Potassium 3.5 - 5.1 mmol/L 4.0 4.1 3.9  Chloride 98 - 111 mmol/L 105 - -  CO2 22 - 32 mmol/L 15(L) - -  Calcium 8.9 - 10.3 mg/dL 7.2(L) - -  Total Protein 6.5 - 8.1 g/dL - - -  Total Bilirubin 0.3 - 1.2 mg/dL - - -  Alkaline Phos 38 - 126 U/L - - -  AST 15 - 41 U/L - - -  ALT 0 - 44 U/L - - -    CBC Latest Ref Rng & Units 09/13/2020 09/13/2020 09/15/2020  WBC 4.0 - 10.5 K/uL 7.6 - -  Hemoglobin 12.0 - 15.0 g/dL 8.1(L) 12.9 7.5(L)  Hematocrit 36 - 46 % 25.3(L) 38.0 22.0(L)  Platelets 150 - 400 K/uL  218 - -    ABG    Component Value Date/Time   PHART 7.284 (L) 09/13/2020 0047   PCO2ART 36.6 09/13/2020 0047   PO2ART 250 (H) 09/13/2020 0047   HCO3 17.3 (L) 09/13/2020 0047   TCO2 18 (L) 09/13/2020 0047   ACIDBASEDEF 9.0 (H) 09/13/2020 0047   O2SAT 100.0 09/13/2020 0047    CBG (last 3)  Recent Labs    09/11/20 0722 09/11/20 1655 10/11/2020 0043  GLUCAP 90 97 94     Critical care time: 101 minutes   Noe Gens, MSN, NP-C [facility] 09/13/2020, 9:23 AM   Please see [email or web address] for pager details.

## 2020-09-13 NOTE — Progress Notes (Signed)
1 Day Post-Op   Subjective/Chief Complaint: Intubated sedated   Objective: Vital signs in last 24 hours: Temp:  [93.9 F (34.4 C)-99 F (37.2 C)] 98.4 F (36.9 C) (10/02 0800) Pulse Rate:  [59-95] 83 (10/02 0800) Resp:  [16-36] 22 (10/02 0800) BP: (73-132)/(40-82) 92/57 (10/02 0800) SpO2:  [100 %] 100 % (10/02 0800) Arterial Line BP: (67-138)/(44-90) 102/54 (10/02 0800) FiO2 (%):  [40 %-50 %] 40 % (10/02 0400) Weight:  [139.3 kg-145.6 kg] 145.6 kg (10/02 0300) Last BM Date: 09/09/20  Intake/Output from previous day: 10/01 0701 - 10/02 0700 In: 4739.1 [I.V.:3574.1; Blood:315; IV Piggyback:850] Out: 1150 [Urine:200; Emesis/NG output:750; Blood:200] Intake/Output this shift: No intake/output data recorded.  GI: soft wound dressed with some drainage approp tedner  Lab Results:  Recent Labs    10/02/2020 0115 09/27/2020 1732 09/13/20 0047 09/13/20 0222  WBC 7.1  --   --  7.6  HGB 7.7*   < > 12.9 8.1*  HCT 24.1*   < > 38.0 25.3*  PLT 252  --   --  218   < > = values in this interval not displayed.   BMET Recent Labs    10/07/2020 0115 09/15/2020 1732 09/13/20 0047 09/13/20 0222  NA 134*   < > 139 135  K 4.2   < > 4.1 4.0  CL 109  --   --  105  CO2 14*  --   --  15*  GLUCOSE 107*  --   --  125*  BUN 33*  --   --  33*  CREATININE 3.33*  --   --  3.25*  CALCIUM 7.3*  --   --  7.2*   < > = values in this interval not displayed.   PT/INR No results for input(s): LABPROT, INR in the last 72 hours. ABG Recent Labs    09/19/2020 1732 09/13/20 0047  PHART 7.299* 7.284*  HCO3 16.1* 17.3*    Studies/Results: CT ABDOMEN PELVIS WO CONTRAST  Result Date: 09/11/2020 CLINICAL DATA:  Bowel obstruction suspected Patient with history of colitis and diarrhea now days without bowel movement and possible ileus on XR EXAM: CT ABDOMEN AND PELVIS WITHOUT CONTRAST TECHNIQUE: Multidetector CT imaging of the abdomen and pelvis was performed following the standard protocol without IV  contrast. COMPARISON:  CT 09/02/2020 FINDINGS: Lower chest: Small left pleural effusion with adjacent compressive atelectasis. Pleural effusion is unchanged from prior exam. Heart is normal in size. Motion artifact limits basilar assessment. Hepatobiliary: Limited by motion artifact, soft tissue attenuation from habitus, and streak artifact from arms down positioning. Hepatic steatosis. No obvious focal abnormality, detailed assessment is limited. Cholecystectomy without biliary dilatation. Pancreas: Equivocal edema adjacent to the pancreatic head, series 3, image 30. Alternatively this may be related to motion artifact. There is no ductal dilatation or peripancreatic collection. Spleen: Normal in size without focal abnormality. Adrenals/Urinary Tract: No adrenal nodule. Bilateral renal parenchymal thinning. No hydronephrosis. Portions of the left kidney are obscured by dense contrast in the adjacent bowel. No renal calculi. Decompressed ureters without evidence of ureteral stone. Urinary bladder is decompressed. Foley catheter in place. Stomach/Bowel: There is circumferential colonic wall thickening extending from the splenic flexure through the sigmoid. The degree of colonic wall thickening has slightly increased from prior exam. The more proximal colon is air distended. No wall thickening of the air distended portions. 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 There is liquid stool in the cecum and ascending colon. Normal appendix. Ventral abdominal wall hernia in the lower abdominal wall to the right of midline contains short segment of small bowel, series 3, image 65, there is no small bowel wall thickening. More proximal small bowel are slightly dilated and fluid-filled, small-bowel distention of 4.4 cm. The distal small bowel  loops are decompressed. Air fluid distention of the stomach. There is no gastric or small bowel wall thickening or inflammatory change. There is no bowel pneumatosis. Vascular/Lymphatic: No portal venous or mesenteric gas. Aortic atherosclerosis. No aortic aneurysm. There is no bulky abdominopelvic adenopathy. Reproductive: Heterogeneously enlarged uterus with fibroids, including a calcified fibroid in the left lower uterine body. There is questionable endometrial thickening, though not well assessed on the current exam. Other: No free air or ascites. Ventral abdominal wall hernias as described above. Musculoskeletal: Diffuse degenerative change in the spine. There are no acute or suspicious osseous abnormalities. IMPRESSION: 1. Infraumbilical hernia containing short segment of small bowel may be causing proximal obstruction. 2. Persistent circumferential colonic wall thickening extending from the splenic flexure through the sigmoid, consistent with colitis. The degree of colonic wall thickening has slightly increased from prior exam. There is gaseous distension of the more proximal colon. 3. 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 No associated wall thickening. No associated colonic obstruction. 4. Prominent colonic diverticulosis without focal diverticulitis. 5. Equivocal edema adjacent to the pancreatic head, which may be related to motion artifact or pancreatitis. Recommend correlation with pancreatic enzymes. 6. Hepatic steatosis. 7. Heterogeneously enlarged uterus with fibroids, including a calcified fibroid in the left lower uterine body. There is questionable endometrial thickening, though not well assessed on the current exam. Recommend nonemergent pelvic ultrasound for characterization on an elective outpatient basis. 8. Small left pleural effusion with adjacent compressive atelectasis, unchanged from prior exam. Aortic  Atherosclerosis (ICD10-I70.0). Electronically Signed   By: Keith Rake M.D.   On: 09/11/2020 16:58   DG Abd 1 View  Result Date: 09/11/2020 CLINICAL DATA:  High-pitched bowel sounds EXAM: ABDOMEN - 1 VIEW COMPARISON:  None. FINDINGS: Scattered large and small bowel gas is noted. No definitive obstructive changes are seen. These findings likely represent a diffuse colonic ileus. No free air is seen. No bony abnormality is noted. IMPRESSION: Gaseous distended colon likely representing generalized colonic ileus. No other focal abnormality is noted. Electronically Signed   By: Inez Catalina M.D.   On: 09/11/2020 14:27   DG Chest Port 1 View  Result Date: 09/30/2020 CLINICAL DATA:  ETT placement EXAM: PORTABLE CHEST 1 VIEW COMPARISON:  09/09/2020 FINDINGS: Interval intubation, tip of the endotracheal tube is about 2 cm superior to the carina. Esophageal tube tip below the diaphragm but incompletely visualized. Right-sided central venous catheter tip over the cavoatrial region. Low lung volumes. Cardiomegaly. No change in airspace disease at the left base. Aortic atherosclerosis. No pneumothorax. IMPRESSION: 1. Interval intubation with tip of the endotracheal tube about 2 cm superior to the carina. 2. Low lung volumes with persistent left basilar airspace disease and possible small left effusion Electronically Signed   By: Donavan Foil M.D.   On: 10/09/2020 19:43   DG Abd Portable 1V  Result Date: 09/26/2020 CLINICAL DATA:  Nasogastric tube placement EXAM: PORTABLE ABDOMEN - 1 VIEW COMPARISON:  September 11, 2020 abdominal radiograph and CT abdomen and pelvis. FINDINGS:  Nasogastric tube tip and side port in the stomach. There is contrast in the stomach. There is mild generalized bowel dilatation without air-fluid level seen on supine examination. No free air appreciable. IMPRESSION: Nasogastric tube tip and side port in stomach. Contrast noted in stomach. Loops of dilated bowel again noted which may  represent ileus or a degree of distal bowel obstruction. Note that CT examination performed 1 day prior shows an area of wall thickening in the mid sigmoid colon which may have inflammatory etiology but also could represent a neoplastic focus. It may be prudent to consider direct visualization of this area. Electronically Signed   By: Lowella Grip III M.D.   On: 10/03/2020 13:27    Anti-infectives: Anti-infectives (From admission, onward)   Start     Dose/Rate Route Frequency Ordered Stop   10/11/2020 1400  cefoTEtan (CEFOTAN) 2 g in sodium chloride 0.9 % 100 mL IVPB       Note to Pharmacy: Pharmacy may adjust dose strength for optimal dosing.   Send with patient on call to the OR.  Anesthesia to complete antibiotic administration <75min prior to incision per [facility].   2 g 200 mL/hr over 30 Minutes Intravenous On call to O.R. 09/15/2020 1128 09/13/20 0129   08/17/20 1500  metroNIDAZOLE (FLAGYL) IVPB 500 mg  Status:  Discontinued        500 mg 100 mL/hr over 60 Minutes Intravenous Every 8 hours 08/17/20 1458 08/19/20 0726      Assessment/Plan: POD 1 elap, loa, primary repair incisional hernias- Donne Hazel -start dressing changes today -appreciate ccm care -can start trickle feeds if ok with ccm, if she cannot or cannot tolerated them she needs tpn started. Her ntn status after this time in hospital is not greast at all. -lovenox, scds  Rolm Bookbinder 09/13/2020

## 2020-09-13 NOTE — Progress Notes (Signed)
Nutrition Follow-up / Consult  DOCUMENTATION CODES:   Non-severe (moderate) malnutrition in context of chronic illness  INTERVENTION:   RD to add PO supplements when diet is advanced.   NUTRITION DIAGNOSIS:   Moderate Malnutrition related to chronic illness (chronic renal insufficiency) as evidenced by percent weight loss, mild muscle depletion, energy intake < 75% for > or equal to 1 month (22% weight loss x 3 months).  Ongoing   GOAL:   Patient will meet greater than or equal to 90% of their needs  Unmet   MONITOR:   PO intake, Supplement acceptance, Diet advancement, Skin, Labs  REASON FOR ASSESSMENT:   Consult Enteral/tube feeding initiation and management  ASSESSMENT:   59 yo female admitted with severe AKI, hypokalemia, hypocalcemia, hypomagnesemia. PMH includes CVA with residual R sided paralysis, wheelchair bound, HTN, chronic renal insufficiency.  S/P ex lap with lysis of adhesions and incisional hernia repair with mesh on 10/1 for SBO due to incarcerated ventral hernia. Patient remained intubated after procedure. Trickle TF with Vital High Protein at 20 ml/h was initiated 10/2. Patient tolerated well. She self-extubated the evening of 10/2 and OG tube was removed, so TF is currently off.   SLP evaluation has been ordered for diet advancement. Prior to surgery, patient was on a dysphagia 2 diet with thin liquids, eating 0-10% of meals. She has refused Ensure, Boost Breeze, and Prosource supplements earlier this admission.   Labs reviewed. BUN 32, creat 3.62, ionized calcium 0.92 CBG: 128-130  Medications reviewed and include colace, miralax, KCl. IVF: D5 with sodium bicarb at 100 ml/h  Current weight 146.1 kg Admission weight 133 kg  UOP 780 ml x 24 hours I/O +2.5 L x 24 hours   Diet Order:   Diet Order            Diet NPO time specified  Diet effective now                 EDUCATION NEEDS:   Not appropriate for education at this time  Skin:   Skin Assessment: Skin Integrity Issues: Skin Integrity Issues:: Other (Comment), Incisions Incisions: surgical incision to abdomen Other: MASD abdomen  Last BM:  9/28 type 6  Height:   Ht Readings from Last 1 Encounters:  10/02/2020 5' 6.6" (1.692 m)    Weight:    Wt Readings from Last 1 Encounters:  09/14/20 (!) 146.1 kg    Ideal Body Weight:  60.2 kg  BMI:  Body mass index is 51.04 kg/m.  Estimated Nutritional Needs:   Kcal:  1900-2200  Protein:  125-150 gm  Fluid:  1.9-2.2 L    Lucas Mallow, RD, LDN, CNSC Please refer to Amion for contact information.

## 2020-09-13 NOTE — Progress Notes (Signed)
Pt vent was alarming. RN walked into room and noticed that the pt had self extubated. Pt was suctioned, and nasal canula was applied. Pt following commands and SpO2 is 100% on room air.  RT called, CCM called. Pt verbally speaking and following commands. No new orders at this time.

## 2020-09-13 NOTE — Plan of Care (Signed)
  Problem: Health Behavior/Discharge Planning: Goal: Ability to manage health-related needs will improve Outcome: Not Progressing   Problem: Elimination: Goal: Will not experience complications related to bowel motility Outcome: Not Progressing   Problem: Education: Goal: Knowledge of disease and its progression will improve Outcome: Not Progressing   Problem: Health Behavior/Discharge Planning: Goal: Ability to manage health-related needs will improve Outcome: Not Progressing   Problem: Nutritional: Goal: Ability to make appropriate dietary choices will improve Outcome: Not Progressing   Unable to progress while intubated and sedated.

## 2020-09-13 NOTE — Care Plan (Signed)
Daughter [name] updated via phone.  Julian Hy, DO 09/13/20 4:57 PM [facility]

## 2020-09-14 ENCOUNTER — Inpatient Hospital Stay (HOSPITAL_COMMUNITY): Payer: Medicaid Other

## 2020-09-14 DIAGNOSIS — N184 Chronic kidney disease, stage 4 (severe): Secondary | ICD-10-CM

## 2020-09-14 DIAGNOSIS — D649 Anemia, unspecified: Secondary | ICD-10-CM

## 2020-09-14 DIAGNOSIS — R1310 Dysphagia, unspecified: Secondary | ICD-10-CM

## 2020-09-14 LAB — POCT I-STAT 7, (LYTES, BLD GAS, ICA,H+H)
Acid-base deficit: 11 mmol/L — ABNORMAL HIGH (ref 0.0–2.0)
Acid-base deficit: 11 mmol/L — ABNORMAL HIGH (ref 0.0–2.0)
Acid-base deficit: 6 mmol/L — ABNORMAL HIGH (ref 0.0–2.0)
Bicarbonate: 15.7 mmol/L — ABNORMAL LOW (ref 20.0–28.0)
Bicarbonate: 14.7 mmol/L — ABNORMAL LOW (ref 20.0–28.0)
Bicarbonate: 18.7 mmol/L — ABNORMAL LOW (ref 20.0–28.0)
Calcium, Ion: 1.04 mmol/L — ABNORMAL LOW (ref 1.15–1.40)
Calcium, Ion: 1.2 mmol/L (ref 1.15–1.40)
Calcium, Ion: 0.92 mmol/L — ABNORMAL LOW (ref 1.15–1.40)
HCT: 22 % — ABNORMAL LOW (ref 36.0–46.0)
HCT: 22 % — ABNORMAL LOW (ref 36.0–46.0)
HCT: 22 % — ABNORMAL LOW (ref 36.0–46.0)
Hemoglobin: 7.5 g/dL — ABNORMAL LOW (ref 12.0–15.0)
Hemoglobin: 7.5 g/dL — ABNORMAL LOW (ref 12.0–15.0)
Hemoglobin: 7.5 g/dL — ABNORMAL LOW (ref 12.0–15.0)
O2 Saturation: 100 %
O2 Saturation: 100 %
O2 Saturation: 99 %
Patient temperature: 35
Patient temperature: 35
Patient temperature: 36.2
Potassium: 3.9 mmol/L (ref 3.5–5.1)
Potassium: 4 mmol/L (ref 3.5–5.1)
Potassium: 4.5 mmol/L (ref 3.5–5.1)
Sodium: 140 mmol/L (ref 135–145)
Sodium: 139 mmol/L (ref 135–145)
Sodium: 137 mmol/L (ref 135–145)
TCO2: 17 mmol/L — ABNORMAL LOW (ref 22–32)
TCO2: 16 mmol/L — ABNORMAL LOW (ref 22–32)
TCO2: 20 mmol/L — ABNORMAL LOW (ref 22–32)
pCO2 arterial: 34.7 mmHg (ref 32.0–48.0)
pCO2 arterial: 30.1 mmHg — ABNORMAL LOW (ref 32.0–48.0)
pCO2 arterial: 29.9 mmHg — ABNORMAL LOW (ref 32.0–48.0)
pH, Arterial: 7.252 — ABNORMAL LOW (ref 7.350–7.450)
pH, Arterial: 7.287 — ABNORMAL LOW (ref 7.350–7.450)
pH, Arterial: 7.4 (ref 7.350–7.450)
pO2, Arterial: 231 mmHg — ABNORMAL HIGH (ref 83.0–108.0)
pO2, Arterial: 240 mmHg — ABNORMAL HIGH (ref 83.0–108.0)
pO2, Arterial: 144 mmHg — ABNORMAL HIGH (ref 83.0–108.0)

## 2020-09-14 LAB — COMPREHENSIVE METABOLIC PANEL
ALT: 28 U/L (ref 0–44)
AST: 25 U/L (ref 15–41)
Albumin: 2 g/dL — ABNORMAL LOW (ref 3.5–5.0)
Alkaline Phosphatase: 66 U/L (ref 38–126)
Anion gap: 16 — ABNORMAL HIGH (ref 5–15)
BUN: 32 mg/dL — ABNORMAL HIGH (ref 6–20)
CO2: 16 mmol/L — ABNORMAL LOW (ref 22–32)
Calcium: 6.7 mg/dL — ABNORMAL LOW (ref 8.9–10.3)
Chloride: 104 mmol/L (ref 98–111)
Creatinine, Ser: 3.62 mg/dL — ABNORMAL HIGH (ref 0.44–1.00)
GFR calc Af Amer: 15 mL/min — ABNORMAL LOW (ref >60)
GFR calc non Af Amer: 13 mL/min — ABNORMAL LOW (ref >60)
Glucose, Bld: 135 mg/dL — ABNORMAL HIGH (ref 70–99)
Potassium: 3.6 mmol/L (ref 3.5–5.1)
Sodium: 136 mmol/L (ref 135–145)
Total Bilirubin: 0.3 mg/dL (ref 0.3–1.2)
Total Protein: 4.8 g/dL — ABNORMAL LOW (ref 6.5–8.1)

## 2020-09-14 LAB — CBC
HCT: 22.2 % — ABNORMAL LOW (ref 36.0–46.0)
Hemoglobin: 7 g/dL — ABNORMAL LOW (ref 12.0–15.0)
MCH: 28 pg (ref 26.0–34.0)
MCHC: 31.5 g/dL (ref 30.0–36.0)
MCV: 88.8 fL (ref 80.0–100.0)
Platelets: 133 10*3/uL — ABNORMAL LOW (ref 150–400)
RBC: 2.5 MIL/uL — ABNORMAL LOW (ref 3.87–5.11)
RDW: 17.7 % — ABNORMAL HIGH (ref 11.5–15.5)
WBC: 6.3 10*3/uL (ref 4.0–10.5)
nRBC: 0 % (ref 0.0–0.2)

## 2020-09-14 LAB — GLUCOSE, CAPILLARY
Glucose-Capillary: 128 mg/dL — ABNORMAL HIGH (ref 70–99)
Glucose-Capillary: 130 mg/dL — ABNORMAL HIGH (ref 70–99)
Glucose-Capillary: 129 mg/dL — ABNORMAL HIGH (ref 70–99)
Glucose-Capillary: 126 mg/dL — ABNORMAL HIGH (ref 70–99)
Glucose-Capillary: 123 mg/dL — ABNORMAL HIGH (ref 70–99)

## 2020-09-14 LAB — TRIGLYCERIDES: Triglycerides: 83 mg/dL (ref <150)

## 2020-09-14 MED ORDER — POTASSIUM CHLORIDE 20 MEQ/15ML (10%) PO SOLN
20.0000 meq | Freq: Once | ORAL | Status: AC
  Administered 2020-09-14: 20 meq via ORAL
  Filled 2020-09-14: qty 15

## 2020-09-14 NOTE — Evaluation (Signed)
Clinical/Bedside Swallow Evaluation Patient Details  Name: Cristina Jordan MRN: [id number] Date of Birth: 09-03-1961  Today's Date: 09/14/2020 Time: SLP Start Time (ACUTE ONLY): 3976 SLP Stop Time (ACUTE ONLY): 1513 SLP Time Calculation (min) (ACUTE ONLY): 19 min  Past Medical History:  Past Medical History:  Diagnosis Date  . Hypertension   . Stroke [facility])    Past Surgical History:  Past Surgical History:  Procedure Laterality Date  . BIOPSY  08/15/2020   Procedure: BIOPSY;  Surgeon: Rush Landmark Telford Nab., MD;  Location: [place];  Service: Gastroenterology;;  EGD and COLON  . CHOLECYSTECTOMY    . ESOPHAGOGASTRODUODENOSCOPY (EGD) WITH PROPOFOL N/A 09/02/2020   Procedure: ESOPHAGOGASTRODUODENOSCOPY (EGD) WITH PROPOFOL;  Surgeon: Rush Landmark Telford Nab., MD;  Location: [place];  Service: Gastroenterology;  Laterality: N/A;  . FLEXIBLE SIGMOIDOSCOPY N/A 08/28/2020   Procedure: FLEXIBLE SIGMOIDOSCOPY;  Surgeon: Irving Copas., MD;  Location: [place];  Service: Gastroenterology;  Laterality: N/A;  . IR FLUORO GUIDE CV LINE LEFT  08/23/2020  . IR US GUIDE VASC ACCESS LEFT  08/23/2020   HPI:  Pt is a 59 y/o female with PMH Morbid obesity.  CVA w R hemiparesis, aphasia, facial droop,  bed to wheelchair bound, sigmoid diverticulosis, 22 teeth extracted 3 days prior to admission, ventral hernias, and uterine fibroids. She presented to the ED on 9/04 with weakness, diarrhea and hypoglycemia after having dental extraction.  She was found to have colitis and renal failure.  Started on ABx and seen by nephrology.  EGD showed non-bleeding duodenal ulcer, and flex sig showed diverticulosis. Had persistent nausea and vomiting with initial improvement with reglan, but vomiting recurred and she was found to have SBO.  She had exploratory laparotomy, lysis of adhesions, and incisional hernia repair with mesh on 10/01. She remained intubated after surgery on 10/1 and self-extubated 10/3. Pt  was seen by speech pathology 9/18-9/23 and was on dysphagia 2 with thin liquids and expressed that she did not wante further diet advancement. Per RD's note on 10/3, pt had been consuming 0-10% of meals and refused supplements earlier in this admission. CXR 10/3: Low lung volumes with bronchovascular crowding. Small LEFT pleural effusion. Persistent retrocardiac opacity likely reflecting atelectasis.   Assessment / Plan / Recommendation Clinical Impression  Pt was seen for bedside swallow evaluation. She reported that since the dental extractions she has been able to eat soft foods once they are finely chopped. Pt's vocal intensity was reduced and speech was dysarthric as was noted during the last swallow evaluation. Pt required multiple breaks between trials stating, "okay, just a minute" and refused consecutive swallows of thin liquids. Pt exhibited prolonged mastication of dyspahgia 3 solids; she ultimately spat out boluses and reported difficulty with mastication secondary to the size of the chopped pieces. However, her swallow mechanism otherwise appeared to be [facility]. A dysphagia 2 diet is recommended and SLP will see pt once more to ensure diet tolerance.  SLP Visit Diagnosis: Dysphagia, unspecified (R13.10)    Aspiration Risk  Mild aspiration risk    Diet Recommendation Dysphagia 2 (Fine chop);Thin liquid   Liquid Administration via: Cup;Straw Medication Administration: Whole meds with puree Supervision: Staff to assist with self feeding Compensations: Slow rate;Small sips/bites;Follow solids with liquid Postural Changes: Seated upright at 90 degrees    Other  Recommendations Oral Care Recommendations: Oral care BID   Follow up Recommendations None      Frequency and Duration min 1 x/week  1 week       Prognosis  Prognosis for Safe Diet Advancement: Fair Barriers to Reach Goals: Severity of deficits (dental status)      Swallow Study   General Date of Onset: 08/15/2020 HPI: Pt is  a 59 y/o female with PMH Morbid obesity.  CVA w R hemiparesis, aphasia, facial droop,  bed to wheelchair bound, sigmoid diverticulosis, 22 teeth extracted 3 days prior to admission, ventral hernias, and uterine fibroids. She presented to the ED on 9/04 with weakness, diarrhea and hypoglycemia after having dental extraction.  She was found to have colitis and renal failure.  Started on ABx and seen by nephrology.  EGD showed non-bleeding duodenal ulcer, and flex sig showed diverticulosis. Had persistent nausea and vomiting with initial improvement with reglan, but vomiting recurred and she was found to have SBO.  She had exploratory laparotomy, lysis of adhesions, and incisional hernia repair with mesh on 10/01. She remained intubated after surgery on 10/1 and self-extubated 10/3. Pt was seen by speech pathology 9/18-9/23 and was on dysphagia 2 with thin liquids and expressed that she did not wante further diet advancement. Per RD's note on 10/3, pt had been consuming 0-10% of meals and refused supplements earlier in this admission. CXR 10/3: Low lung volumes with bronchovascular crowding. Small LEFT pleural effusion. Persistent retrocardiac opacity likely reflecting atelectasis. Type of Study: Bedside Swallow Evaluation Previous Swallow Assessment: See HPI Diet Prior to this Study: NPO Temperature Spikes Noted: No Respiratory Status: Nasal cannula History of Recent Intubation: Yes Length of Intubations (days): 2 days Date extubated: 09/13/20 Behavior/Cognition: Alert Oral Care Completed by SLP: No Oral Cavity - Dentition: Other (Comment) (Had most teeth removed prior to hospitalization) Vision: Functional for self-feeding Baseline Vocal Quality: Low vocal intensity;Other (comment) (dysarthric) Volitional Cough: Cognitively unable to elicit Volitional Swallow: Unable to elicit    Oral/Motor/Sensory Function Overall Oral Motor/Sensory Function: Mild impairment Facial ROM: Reduced right Facial  Symmetry: Abnormal symmetry right Facial Strength: Reduced right Lingual ROM: Within Functional Limits Lingual Symmetry: Abnormal symmetry right Lingual Strength: Reduced   Ice Chips Ice chips: Within functional limits Presentation: Spoon   Thin Liquid Thin Liquid: Within functional limits Presentation: Straw;Cup;Self Fed Other Comments: Pt refused consecutive swallows stating, "wait a minute" each time it was prompted.     Nectar Thick Nectar Thick Liquid: Not tested   Honey Thick Honey Thick Liquid: Not tested   Puree Puree: Within functional limits Presentation: Spoon   Solid     Solid: Impaired Presentation: Spoon Oral Phase Impairments: Impaired mastication     Sabriyah Wilcher I. Hardin Negus, [place], [place] Office number [phone] Pager [place] 09/14/2020,4:35 PM

## 2020-09-14 NOTE — Progress Notes (Signed)
[place] Progress Note Patient Name: Cristina Jordan DOB: Dec 24, 1960 MRN: [id number]   Date of Service  09/14/2020  HPI/Events of Note  Request for orders to discontinue art line so patient can be transferred  eICU Interventions  Order placed     Intervention Category Intermediate Interventions: Other:  Margaretmary Lombard 09/14/2020, 8:05 PM

## 2020-09-14 NOTE — Progress Notes (Signed)
eLink Physician-Brief Progress Note Patient Name: Cristina Jordan DOB: 1960/12/14 MRN: [id number]   Date of Service  09/14/2020  HPI/Events of Note  Patient self-extubated. SpO2 100% on 2L [place]. Easy WOB. Alert and oriented x3.   eICU Interventions  Patient is doing well and does not require re-intubation. Continue to monitor.      Intervention Category Major Interventions: Airway management  Marily Lente Ayuub Penley 09/14/2020, 12:50 AM

## 2020-09-14 NOTE — Progress Notes (Signed)
Progress Note: General Surgery Service   Chief Complaint/Subjective: Self extubated overnight, wants mitt removed  Objective: Vital signs in last 24 hours: Temp:  [97 F (36.1 C)-98.6 F (37 C)] 97.2 F (36.2 C) (10/03 0700) Pulse Rate:  [81-108] 89 (10/03 0700) Resp:  [11-25] 13 (10/03 0700) BP: (79-156)/(52-90) 115/75 (10/03 0700) SpO2:  [100 %] 100 % (10/03 0700) Arterial Line BP: (91-181)/(51-102) 113/62 (10/03 0700) FiO2 (%):  [40 %] 40 % (10/02 2000) Weight:  [146.1 kg] 146.1 kg (10/03 0500) Last BM Date: 09/09/20  Intake/Output from previous day: 10/02 0701 - 10/03 0700 In: 3292.8 [I.V.:2475.9; NG/GT:264; IV Piggyback:552.9] Out: 780 [Urine:780] Intake/Output this shift: No intake/output data recorded.  Gen: NAD, answers simple questions with delay  Resp: nonlabored  Card: RRR  Abd: soft, open wound with intact fascia, retentions in place, no erythema  Lab Results: CBC  Recent Labs    09/13/20 0222 09/13/20 0627 09/14/20 0412 09/14/20 0546  WBC 7.6  --  6.3  --   HGB 8.1*   < > 7.0* 7.5*  HCT 25.3*   < > 22.2* 22.0*  PLT 218  --  133*  --    < > = values in this interval not displayed.   BMET Recent Labs    09/13/20 0222 09/13/20 0627 09/14/20 0412 09/14/20 0546  NA 135   < > 136 137  K 4.0   < > 3.6 4.5  CL 105  --  104  --   CO2 15*  --  16*  --   GLUCOSE 125*  --  135*  --   BUN 33*  --  32*  --   CREATININE 3.25*  --  3.62*  --   CALCIUM 7.2*  --  6.7*  --    < > = values in this interval not displayed.   PT/INR No results for input(s): LABPROT, INR in the last 72 hours. ABG Recent Labs    09/13/20 0627 09/14/20 0546  PHART 7.342* 7.400  HCO3 16.8* 18.7*    Anti-infectives: Anti-infectives (From admission, onward)   Start     Dose/Rate Route Frequency Ordered Stop   09/27/2020 1400  cefoTEtan (CEFOTAN) 2 g in sodium chloride 0.9 % 100 mL IVPB       Note to Pharmacy: Pharmacy may adjust dose strength for optimal dosing.   Send  with patient on call to the OR.  Anesthesia to complete antibiotic administration <35mn [facility]   2 g 200 mL/hr over 30 Minutes Intravenous On call to O.R. 10/04/2020 1128 09/13/20 0129   08/17/20 1500  metroNIDAZOLE (FLAGYL) IVPB 500 mg  Status:  Discontinued        500 mg 100 mL/hr over 60 Minutes Intravenous Every 8 hours 08/17/20 1458 08/19/20 0726      Medications: Scheduled Meds: . chlorhexidine gluconate (MEDLINE KIT)  15 mL Mouth Rinse BID  . Chlorhexidine Gluconate Cloth  6 each Topical Daily  . docusate  100 mg Per Tube BID  . feeding supplement (VITAL HIGH PROTEIN)  1,000 mL Per Tube Q24H  . heparin injection (subcutaneous)  5,000 Units Subcutaneous Q8H  . pantoprazole (PROTONIX) IV  40 mg Intravenous BID  . polyethylene glycol  17 g Per Tube Daily  . sodium chloride flush  10-40 mL Intracatheter Q12H  . sodium chloride flush  3 mL Intravenous Q12H   Continuous Infusions: . sodium chloride 250 mL (09/11/20 0119)  . phenylephrine (NEO-SYNEPHRINE) Adult infusion Stopped (  09/13/20 1828)  . propofol (DIPRIVAN) infusion Stopped (09/13/20 1144)  . sodium bicarbonate in D5W 1000 mL infusion 100 mL/hr at 09/14/20 0700   PRN Meds:.sodium chloride, acetaminophen **OR** acetaminophen, fentaNYL (SUBLIMAZE) injection, midazolam, sodium chloride flush  Assessment/Plan: s/p Procedure(s): EXPLORATORY LAPAROTOMY Primary Repair Of Incisional Hernia LYSIS OF ADHESION 09/19/2020 Extubated overnight with OG removal, neurologically answers simple questions but persisting focus on mitt and had pulled at medical devices earlier -NPO until neurologically improved, will order speech evaluation -BID dressing changes -difficult case in chronically ill person with poor nutrition: expecting further difficulties    LOS: 29 days   Mickeal Skinner, MD [facility]

## 2020-09-14 NOTE — Procedures (Signed)
Extubation Procedure Note  Patient Details:   Name: Cristina Jordan DOB: 04-21-1961 MRN: [id number]   Airway Documentation:    Vent end date: (not recorded) Vent end time: (not recorded)   Evaluation  O2 sats: currently acceptable Complications: No apparent complications Patient did tolerate procedure well. Bilateral Breath Sounds: Clear, Diminished   Yes  Chriss Driver [facility] 09/14/2020, 1:44 AM

## 2020-09-14 NOTE — Progress Notes (Signed)
NAME:  Cristina Jordan, MRN:  [id number], DOB:  [birth year], LOS: 61 ADMISSION DATE:  09/10/2020, CONSULTATION DATE:  10/1 REFERRING MD:  Dr. Rebeca Alert, CHIEF COMPLAINT:  Post-op vent   Brief History   59 yo wheelchair bound female presented to ER on 9/04 with weakness, diarrhea and hypoglycemia after having dental extraction.  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  Found to have colitis and renal failure.  Started on ABx and seen by nephrology.  GI consulted to assess diarrhea and abdominal pain.  EGD showed non-bleeding duodenal ulcer, and flex sig showed diverticulosis.  Had persistent nausea and vomiting, but had initial improvement with reglan.  However, vomiting recurred and found to have SBO.  Surgery consulted.   She had exploratory laparotomy, lysis of adhesions, and incisional hernia repair with mesh on 10/01.  She required pressors and remained on vent after surgery.  PCCM assumed care in ICU.  Hx from chart and medical team.  Past Medical History  HTN, CVA, CKD 3b  Significant Hospital Events   9/04 admit, nephrology consulted 9/06 palliative care consulted 9/07 DNR 9/10 EGD, Flex sig 9/11 Rt IJ DL power line by IR, Nephrology sign off 9/13 Gyn consulted for vaginal bleeding 9/20 recurrent nausea and vomiting 9/21 chest pain 9/23 start reglan for refractory nausea 9/24 transfuse 1 unit PRBC, GI sign off 9/30 recurrent vomiting, found to have SBO, surgery consulted 10/01 exploratory laparotomy, lysis of adhesions, and incisional hernia repair with mesh, transfuse 1 unit PRBC  Consults:  Nephrology Palliative care Gastroenterology Surgery  Procedures:  Rt IJ DL power line 9/11 >> Lt brachial arterial line 10/1 >> ETT 10/1 >>   Significant Diagnostic Tests:  9/4 CT head > Area of hypoattenuation in the periventricular white matter of the left frontal lobe extends into the left basal ganglia/external capsule. This may represent a chronic infarct 9/4  CT abdomen > Suggestion of diffuse colonic wall thickening with pericolonic edema involving the descending and sigmoid colon, suspicious for colitis. This may be infectious or inflammatory. Distal colonic diverticulosis without focal diverticulitis. Right paramidline ventral abdominal wall hernia contains short segment of small bowel without obstruction or inflammatory change. Left paramidline ventral abdominal wall hernia at the same level contains only fat. 9/4 US renal > Mildly increased echogenicity of both kidneys, compatible with medical renal disease. 9/10 endoscopy > 4cm hiatal hernia, tortuous esophagus, erythematous mucosa in the stomach. Non-bleeding duodenal ulcers, duodenitis.  9/10 flex sig > diverticulosis in the recto-sigmoid colon and in sigmoid colon.  9/21 CT abd > Subtle ground-glass pulmonary infiltrate within the visualized right lower lobe, new since prior examination, suspicious for acute infection or edema. Persistent, though improving, long segment inflammatory change involving the descending and sigmoid colon. Abnormal thickening of the endometrium. Development of mild ascites, diffuse body wall edema, and small left pleural effusion in keeping with changes of a progressive anasarca. 9/23 Dg esophagus > Esophageal dysmotility. 9/30 CT abdomen > Infraumbilical hernia containing short segment of small bowel may be causing proximal obstruction. Persistent circumferential colonic wall thickening extending from the splenic flexure through the sigmoid, consistent with colitis.  Micro Data:  GI pathogen PCR 9/05 >> negative C diff PCR 9/05 >> negative GI pathogen PCR 9/25 >> negative  Antimicrobials:  Metronidazole 9/5 > 9/6  Interim history/subjective:  Self-extubated overnight. This morning denies complaints but wants to go home.   Objective   Blood pressure 115/75, pulse 89, temperature (!) 97.2 F (36.2 C), resp.  rate 13, height 5' 6.6" (1.692 m), weight (!) 146.1 kg,  SpO2 100 %.    Vent Mode: PRVC FiO2 (%):  [40 %] 40 % Set Rate:  [18 bmp] 18 bmp Vt Set:  [480 mL] 480 mL PEEP:  [5 cmH20] 5 cmH20 Pressure Support:  [10 cmH20] 10 cmH20 Plateau Pressure:  [12 cmH20] 12 cmH20   Intake/Output Summary (Last 24 hours) at 09/14/2020 4098 Last data filed at 09/14/2020 0700 Gross per 24 hour  Intake 3170.81 ml  Output 780 ml  Net 2390.81 ml   Filed Weights   09/13/2020 1408 09/13/20 0300 09/14/20 0500  Weight: (!) 139.3 kg (!) 145.6 kg (!) 146.1 kg   Exam: General: chronically ill appearing woman in NAD HEENT: [place]/AT, eyes anicteric, oral mucosa moist Neuro: Awake and alert, slightly slurred speech.  Globally weak.  Not moving right upper extremity. CV: Regular rate and rhythm, no murmurs PULM: Breathing comfortably on nasal cannula, clear to auscultation bilaterally.  Distant breath sounds. GI: Obese, soft.  Midline dressing with minimal dried drainage. Extremities: No significant edema, no clubbing or cyanosis Skin: No rashes, abdominal incision not examined.  Resolved Hospital Problem list     Assessment & Plan:   SBO in setting of ventral hernia and adhesions s/p laparotomy 10/01. -Continue routine postop care per surgery -Likely will need tube feeds restarted given concern for effective swallowing.  SLP evaluation ordered. Previously required dysphagia 2. -If not able to adequately meet her nutritional needs enterally would require TPN; hopefully this can be avoided  Hypotension from hypovolemia and sedation-resolved -Continue to monitor.  Off pressors and stable to stepdown from the ICU today.  Post-operative acute hypoxic respiratory failure -Self extubated overnight on 10/3. -Wean supplemental oxygen as able to maintain SPO2 greater than 90%. -Out of bed mobility, pulmonary hygiene.  CKD 3b>4 Non anion gap metabolic acidosis Hypomagnesemia  -continue bicarbonate as above.  Likely will require enteral bicarb -Continue to monitor renal  function and electrolytes.  She would not be a good candidate for dialysis with progressive renal failure. -Avoid nephrotoxic agents, ensure adequate renal perfusion -Strict I's/O  Anemia of critical illness, renal disease, and chronic disease. -Serial CBCs -transfuse for Hgb <7% or hemodynamically significant significant bleeding   Duodenal ulcer w/o hemorrhage. -Continue Protonix twice daily  Acute metabolic encephalopathy in setting of renal failure, SBO. Hx of CVA with Rt sided weakness and dysarthria. -SLP evaluation -PT, OT -Delirium precautions  Hx of HTN. -Continue holding outpatient Norvasc, may be able to resume this soon  Best practice:  Diet: NPO DVT prophylaxis: [place] heparin GI prophylaxis: Protonix Mobility: bed rest Code Status: DNR Disposition: Medical telemetry floor  Labs    CMP Latest Ref Rng & Units 09/14/2020 09/14/2020 09/13/2020  Glucose 70 - 99 mg/dL - 135(H) -  BUN 6 - 20 mg/dL - 32(H) -  Creatinine 0.44 - 1.00 mg/dL - 3.62(H) -  Sodium 135 - 145 mmol/L 137 136 139  Potassium 3.5 - 5.1 mmol/L 4.5 3.6 3.9  Chloride 98 - 111 mmol/L - 104 -  CO2 22 - 32 mmol/L - 16(L) -  Calcium 8.9 - 10.3 mg/dL - 6.7(L) -  Total Protein 6.5 - 8.1 g/dL - 4.8(L) -  Total Bilirubin 0.3 - 1.2 mg/dL - 0.3 -  Alkaline Phos 38 - 126 U/L - 66 -  AST 15 - 41 U/L - 25 -  ALT 0 - 44 U/L - 28 -    CBC Latest Ref Rng & Units  09/14/2020 09/14/2020 09/13/2020  WBC 4.0 - 10.5 K/uL - 6.3 -  Hemoglobin 12.0 - 15.0 g/dL 7.5(L) 7.0(L) 7.5(L)  Hematocrit 36 - 46 % 22.0(L) 22.2(L) 22.0(L)  Platelets 150 - 400 K/uL - 133(L) -    ABG    Component Value Date/Time   PHART 7.400 09/14/2020 0546   PCO2ART 29.9 (L) 09/14/2020 0546   PO2ART 144 (H) 09/14/2020 0546   HCO3 18.7 (L) 09/14/2020 0546   TCO2 20 (L) 09/14/2020 0546   ACIDBASEDEF 6.0 (H) 09/14/2020 0546   O2SAT 99.0 09/14/2020 0546    CBG (last 3)  Recent Labs    09/13/20 2327 09/14/20 0454 09/14/20 0739  GLUCAP 127*  128* Fortine Dewarren Ledbetter, DO 09/14/20 9:52 AM [facility]

## 2020-09-14 NOTE — Plan of Care (Signed)
Patient discussed with Dr. Court Joy from IM resident team, who will assume care tomorrow morning.  Julian Hy, DO 09/14/20 10:15 AM [facility]

## 2020-09-15 ENCOUNTER — Encounter (HOSPITAL_COMMUNITY): Payer: Self-pay | Admitting: General Surgery

## 2020-09-15 DIAGNOSIS — L8990 Pressure ulcer of unspecified site, unspecified stage: Secondary | ICD-10-CM | POA: Insufficient documentation

## 2020-09-15 LAB — CBC
HCT: 22.2 % — ABNORMAL LOW (ref 36.0–46.0)
Hemoglobin: 7.1 g/dL — ABNORMAL LOW (ref 12.0–15.0)
MCH: 28.9 pg (ref 26.0–34.0)
MCHC: 32 g/dL (ref 30.0–36.0)
MCV: 90.2 fL (ref 80.0–100.0)
Platelets: 122 10*3/uL — ABNORMAL LOW (ref 150–400)
RBC: 2.46 MIL/uL — ABNORMAL LOW (ref 3.87–5.11)
RDW: 17.8 % — ABNORMAL HIGH (ref 11.5–15.5)
WBC: 4.3 10*3/uL (ref 4.0–10.5)
nRBC: 0.5 % — ABNORMAL HIGH (ref 0.0–0.2)

## 2020-09-15 LAB — BASIC METABOLIC PANEL
Anion gap: 14 (ref 5–15)
BUN: 38 mg/dL — ABNORMAL HIGH (ref 6–20)
CO2: 23 mmol/L (ref 22–32)
Calcium: 6.3 mg/dL — CL (ref 8.9–10.3)
Chloride: 99 mmol/L (ref 98–111)
Creatinine, Ser: 3.6 mg/dL — ABNORMAL HIGH (ref 0.44–1.00)
GFR calc Af Amer: 16 mL/min — ABNORMAL LOW (ref >60)
GFR calc non Af Amer: 13 mL/min — ABNORMAL LOW (ref >60)
Glucose, Bld: 129 mg/dL — ABNORMAL HIGH (ref 70–99)
Potassium: 3.6 mmol/L (ref 3.5–5.1)
Sodium: 136 mmol/L (ref 135–145)

## 2020-09-15 LAB — GLUCOSE, CAPILLARY
Glucose-Capillary: 105 mg/dL — ABNORMAL HIGH (ref 70–99)
Glucose-Capillary: 115 mg/dL — ABNORMAL HIGH (ref 70–99)
Glucose-Capillary: 119 mg/dL — ABNORMAL HIGH (ref 70–99)
Glucose-Capillary: 119 mg/dL — ABNORMAL HIGH (ref 70–99)
Glucose-Capillary: 121 mg/dL — ABNORMAL HIGH (ref 70–99)
Glucose-Capillary: 128 mg/dL — ABNORMAL HIGH (ref 70–99)

## 2020-09-15 LAB — MAGNESIUM: Magnesium: 1.6 mg/dL — ABNORMAL LOW (ref 1.7–2.4)

## 2020-09-15 MED ORDER — MAGNESIUM SULFATE 2 GM/50ML IV SOLN
2.0000 g | Freq: Once | INTRAVENOUS | Status: AC
  Administered 2020-09-15: 2 g via INTRAVENOUS
  Filled 2020-09-15: qty 50

## 2020-09-15 MED ORDER — POTASSIUM CHLORIDE 10 MEQ/100ML IV SOLN
10.0000 meq | INTRAVENOUS | Status: AC
  Administered 2020-09-15 (×4): 10 meq via INTRAVENOUS
  Filled 2020-09-15 (×4): qty 100

## 2020-09-15 MED ORDER — MORPHINE SULFATE (PF) 2 MG/ML IV SOLN
2.0000 mg | INTRAVENOUS | Status: DC | PRN
  Administered 2020-09-16: 2 mg via INTRAVENOUS
  Filled 2020-09-15: qty 1

## 2020-09-15 MED ORDER — METHOCARBAMOL 500 MG PO TABS: 500.0000 mg | ORAL_TABLET | Freq: Four times a day (QID) | ORAL | Status: DC | PRN

## 2020-09-15 MED ORDER — DOCUSATE SODIUM 100 MG PO CAPS
100.0000 mg | ORAL_CAPSULE | Freq: Two times a day (BID) | ORAL | Status: DC
  Administered 2020-09-15 – 2020-09-16 (×2): 100 mg via ORAL
  Filled 2020-09-15 (×4): qty 1

## 2020-09-15 MED ORDER — CALCIUM GLUCONATE-NACL 1-0.675 GM/50ML-% IV SOLN
1.0000 g | Freq: Once | INTRAVENOUS | Status: AC
  Administered 2020-09-15: 1000 mg via INTRAVENOUS
  Filled 2020-09-15 (×2): qty 50

## 2020-09-15 MED ORDER — POLYETHYLENE GLYCOL 3350 17 G PO PACK
17.0000 g | PACK | Freq: Every day | ORAL | Status: DC
  Filled 2020-09-15: qty 1

## 2020-09-15 NOTE — Progress Notes (Signed)
New Admission Note:   Arrival Method:  Via Size Wise Bed Mental Orientation:  A & O x 3 - disoriented to name of hospital Telemetry:  Placed on Tele #15 - NSR Assessment: Completed Skin:  Abdominal Incision, MASD to abdominal skin folds, Stage II to Coccyx bone IV:  Rt DL CVC Pain: Denies Tubes:  Foley Catheter Safety Measures: Safety Fall Prevention Plan has been given, discussed and signed Admission: Completed 5 MW Orientation: Patient has been orientated to the room, unit and staff.  Family:  None at bedside  Patient transferred from 2 Heart.  No complaints of pain.  Reoriented to unit.  Orders have been reviewed and implemented. Will continue to monitor the patient. Call light has been placed within reach and bed alarm has been activated.   Earleen Reaper RN- BC, [facility] Phone number: [phone]

## 2020-09-15 NOTE — Anesthesia Postprocedure Evaluation (Signed)
Anesthesia Post Note  Patient: Cristina Jordan  Procedure(s) Performed: EXPLORATORY LAPAROTOMY (N/A ) Primary Repair Of Incisional Hernia (N/A Abdomen) LYSIS OF ADHESION (N/A Abdomen)     Patient location during evaluation: ICU Anesthesia Type: General Level of consciousness: sedated and patient remains intubated per anesthesia plan Pain management: pain level controlled Vital Signs Assessment: post-procedure vital signs reviewed and stable Respiratory status: patient remains intubated per anesthesia plan Cardiovascular status: stable Postop Assessment: no apparent nausea or vomiting Anesthetic complications: no   No complications documented.  Last Vitals:  Vitals:   09/15/20 0006 09/15/20 0502  BP: 102/67 (!) 124/59  Pulse: 93 91  Resp: 17 17  Temp: 36.6 C 36.6 C  SpO2: 99% 99%    Last Pain:  Vitals:   09/15/20 0502  TempSrc: Oral  PainSc:                  Audry Pili

## 2020-09-15 NOTE — Progress Notes (Addendum)
Subjective: Patient was seen and evaluated at bedside on morning rounds. No acute events overnight. She mentions she is thirsty and asks for water. She is able to drink water w/o difficulty. Denies abdominal pain. No further N/V. She reports passing gas but no bowel movement.  Objective:  Vital signs in last 24 hours: Vitals:   09/14/20 2345 09/14/20 2346 09/15/20 0006 09/15/20 0502  BP: 99/77 108/61 102/67 (!) 124/59  Pulse: 95 95 93 91  Resp: 14 13 17 17   Temp: (!) 97.2 F (36.2 C) (!) 97.2 F (36.2 C) 97.9 F (36.6 C) 97.8 F (36.6 C)  TempSrc:   Oral Oral  SpO2: 100% 100% 99% 99%  Weight:      Height:       Constitutional: Obese lady, ill appearing, no acute distress.  Cardiovascular: RRR, nl S1S2, no murmur, no LEE Respiratory: Effort normal and breath sounds normal. No respiratory distress. No wheezes.  GI: Soft. Surgery site and dressing is clean. No distension. There is no tenderness. BM are present but decreased. Neurological: Is alert and oriented at baseline, residual right side paralysis Psychiatric:  Normal mood and affect. Behavior is normal. Judgment and thought content normal.    Assessment/Plan:  Active Problems:   Rhabdomyolysis   Hypokalemia   Hypocalcemia   Acute renal failure (HCC)   Malnutrition of moderate degree   Palliative care by specialist   DNR (do not resuscitate) discussion   Lethargy   DNR (do not resuscitate)   Weakness generalized   Vaginal bleeding   Colitis   Chronic gastritis without bleeding   Morbid obesity ([place])   Hiatal hernia   Non-intractable vomiting   Regurgitation of food   Iron deficiency anemia due to chronic blood loss   Anemia  59 year old female with past medical history of stroke with residual right-sided Who initially presented with poor p.o. intake, (worsened after all of her teeth were extracted 3 days before admission), weakness, diarrhea, severe electrolyte abnormality and AKI/ATN.  She has had a long  hospitalization complicated with vaginal bleeding and anemia (and required transfusion), diarrhea, continues nausea and vomiting. She found to have SBO and underwent ex lap. She required ICU admission after surgery as she remained on ventilator. She is now transferred to the IMTS service.  # SBO: S/p exploratory laparotomy, lysis of adhesions, and incisional hernia repair with mesh on 10/01.  POD #3 Pt denies abd pain, N/V. Abdomen is soft, surgical incision site and the dressing is clean.  -Continue clear liquid diet per surgery -Awaiting BM -CBC daily -Appreciate surgery team f/u and recommendations   #AKI #Sever electrolytes abnormality: Hypo K, hypo Ca, hypo Mg # Prolonged Qtc # Compensated metabolic acidosis  On arrival, Cr was 10.2 (was 1.7 on 2019). CK 1266>resolved. Kidney function gradualy improved w IVF and manegement by nephrology She is on Bicarbonate for continue low PH. (Likley RTA1) Electrolyte derangement: K< 2, corrected Ca ~6.4, Likely 2/2 poor p.o. intake and diarrhea. Improved.  -AI/ATN improved -CMP daily -Mg daily -Ordering K, Mg and Calcium today to keep K>4 and Mg>2 -Calcitriol, Vit D 25 Oh, PTH intact -Continue IV Na Bicarb. Will switch to PO when able to have PO  #Anemia #Vaginal  bleeding and likely surgery blood loss Required transfusion earlier this admission. (RBC transfusion 9/7 and 9/12)  -Hb borderline at 7.1 today. Will monitor closely. Repeat CBC this PM and transfuse if needed -Resume Megestrol when can take PO -Was seen by OBGYN this admission. F/u o/p  with OBGYN and continue megestrol  # Esophageal dysmotility: #Diarrhea & Colitis:  Earlier this admission. Improved. likely viral.  C--diff and GI panel were negative.  Got metronidazole (9/5>9/7). GI performed EGD and flex sigmoidoscopy 9/10: w/o active bleeding negative H pylori.   She was on On Diltiazem for esophageal dysmotility. -On phenergan,  w monitoring QTC.  Prior to  Admission Living Arrangement: Anticipated Discharge Location: Barriers to Discharge: Dispo: Anticipated discharge depend on clinical improvement  [name], MD 09/15/2020, 8:36 AM Pager: [phone] After 5pm on weekdays and 1pm on weekends: On Call pager [phone]

## 2020-09-15 NOTE — Progress Notes (Signed)
   09/15/20 0548  Provider Notification  Provider Name/Title Critical Care  Date Provider Notified 09/15/20  Time Provider Notified 0530  Notification Type Page  Notification Reason Change in status (UOP 50cc since 0600 09/14/2020)  Response No new orders   Patient arrived to the floor from [place] at approximately [place].  She has a Foley Catheter in place.  This morning, patient only had 50 cc urine in bag.  Last urine output document 09/14/2020 at 0600 - 125 cc.  Critical Care made aware.  No new orders at this time.  Will continue to monitor patient.  Earleen Reaper RN

## 2020-09-15 NOTE — Progress Notes (Signed)
[facility] Progress Note  3 Days Post-Op  Subjective: CC-  Transferred out of ICU yesterday. Tolerating some clear liquids although not taking in very much. Denies any nausea or vomiting. No flatus or BM. Passed for D2 diet with SLP once bowel function returns.  Objective: Vital signs in last 24 hours: Temp:  [96.8 F (36 C)-97.9 F (36.6 C)] 97.5 F (36.4 C) (10/04 0916) Pulse Rate:  [90-96] 90 (10/04 0916) Resp:  [11-22] 18 (10/04 0916) BP: (89-126)/(54-80) 120/71 (10/04 0916) SpO2:  [99 %-100 %] 100 % (10/04 0916) Arterial Line BP: (88-116)/(56-66) 96/58 (10/03 2145) Last BM Date: 09/09/20  Intake/Output from previous day: 10/03 0701 - 10/04 0700 In: 2164.5 [I.V.:2164.5] Out: 525 [Urine:525] Intake/Output this shift: Total I/O In: 300 [P.O.:300] Out: -   PE: Gen:  Alert, NAD Pulm:  rate and effort normal Psych: Alert, oriented to self, location, time. Answers questions appropriately  Abd: open midline wound clean without drainage, no cellulitis, retention sutures in place     Lab Results:  Recent Labs    09/13/20 0222 09/13/20 0627 09/14/20 0412 09/14/20 0546  WBC 7.6  --  6.3  --   HGB 8.1*   < > 7.0* 7.5*  HCT 25.3*   < > 22.2* 22.0*  PLT 218  --  133*  --    < > = values in this interval not displayed.   BMET Recent Labs    09/13/20 0222 09/13/20 0627 09/14/20 0412 09/14/20 0546  NA 135   < > 136 137  K 4.0   < > 3.6 4.5  CL 105  --  104  --   CO2 15*  --  16*  --   GLUCOSE 125*  --  135*  --   BUN 33*  --  32*  --   CREATININE 3.25*  --  3.62*  --   CALCIUM 7.2*  --  6.7*  --    < > = values in this interval not displayed.   PT/INR No results for input(s): LABPROT, INR in the last 72 hours. CMP     Component Value Date/Time   NA 137 09/14/2020 0546   K 4.5 09/14/2020 0546   CL 104 09/14/2020 0412   CO2 16 (L) 09/14/2020 0412   GLUCOSE 135 (H) 09/14/2020 0412   BUN 32 (H) 09/14/2020 0412   CREATININE 3.62 (H)  09/14/2020 0412   CALCIUM 6.7 (L) 09/14/2020 0412   PROT 4.8 (L) 09/14/2020 0412   ALBUMIN 2.0 (L) 09/14/2020 0412   AST 25 09/14/2020 0412   ALT 28 09/14/2020 0412   ALKPHOS 66 09/14/2020 0412   BILITOT 0.3 09/14/2020 0412   GFRNONAA 13 (L) 09/14/2020 0412   GFRAA 15 (L) 09/14/2020 0412   Lipase     Component Value Date/Time   LIPASE 263 (H) 08/17/2020 0505       Studies/Results: DG CHEST PORT 1 VIEW  Result Date: 09/14/2020 CLINICAL DATA:  Respiratory failure EXAM: PORTABLE CHEST 1 VIEW COMPARISON:  September 12, 2020 FINDINGS: The cardiomediastinal silhouette is unchanged and enlarged in contour.RIGHT chest CVC tip terminates over the RIGHT atrium. Interval extubation removal of enteric tube. Low lung volumes with bronchovascular crowding. Small LEFT pleural effusion. No pneumothorax. Persistent homogeneous opacification of the LEFT lung base likely reflecting atelectasis. Visualized abdomen is unremarkable. Multilevel degenerative changes of the thoracic spine. IMPRESSION: 1. Low lung volumes with bronchovascular crowding. 2. Small LEFT pleural effusion. 3. Persistent retrocardiac opacity likely reflecting atelectasis. Electronically  Signed   By: Valentino Saxon MD   On: 09/14/2020 08:13    Anti-infectives: Anti-infectives (From admission, onward)   Start     Dose/Rate Route Frequency Ordered Stop   09/16/2020 1400  cefoTEtan (CEFOTAN) 2 g in sodium chloride 0.9 % 100 mL IVPB       Note to Pharmacy: Pharmacy may adjust dose strength for optimal dosing.   Send with patient on call to the OR.  Anesthesia to complete antibiotic administration <48min prior to incision per [facility].   2 g 200 mL/hr over 30 Minutes Intravenous On call to O.R. 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 Donne Hazel   LOS: 52 days    Wellington Hampshire, [facility] 09/15/2020, 9:27 AM Please see Amion for pager number during day hours 7:00am-4:30pm

## 2020-09-16 ENCOUNTER — Encounter: Payer: Medicaid Other | Admitting: Obstetrics & Gynecology

## 2020-09-16 DIAGNOSIS — Z9889 Other specified postprocedural states: Secondary | ICD-10-CM

## 2020-09-16 DIAGNOSIS — K565 Intestinal adhesions [bands], unspecified as to partial versus complete obstruction: Secondary | ICD-10-CM

## 2020-09-16 DIAGNOSIS — N898 Other specified noninflammatory disorders of vagina: Secondary | ICD-10-CM

## 2020-09-16 DIAGNOSIS — R9431 Abnormal electrocardiogram [ECG] [EKG]: Secondary | ICD-10-CM

## 2020-09-16 LAB — CBC
HCT: 22 % — ABNORMAL LOW (ref 36.0–46.0)
Hemoglobin: 7.1 g/dL — ABNORMAL LOW (ref 12.0–15.0)
MCH: 29.2 pg (ref 26.0–34.0)
MCHC: 32.3 g/dL (ref 30.0–36.0)
MCV: 90.5 fL (ref 80.0–100.0)
Platelets: 112 10*3/uL — ABNORMAL LOW (ref 150–400)
RBC: 2.43 MIL/uL — ABNORMAL LOW (ref 3.87–5.11)
RDW: 17.9 % — ABNORMAL HIGH (ref 11.5–15.5)
WBC: 4.4 10*3/uL (ref 4.0–10.5)
nRBC: 0.5 % — ABNORMAL HIGH (ref 0.0–0.2)

## 2020-09-16 LAB — BPAM RBC
Blood Product Expiration Date: 202110312359
Blood Product Expiration Date: 202110312359
Blood Product Expiration Date: 202110312359
Blood Product Expiration Date: 202110312359
ISSUE DATE / TIME: 202110011551
ISSUE DATE / TIME: 202110011551
ISSUE DATE / TIME: 202110011551
ISSUE DATE / TIME: 202110011551
Unit Type and Rh: 5100
Unit Type and Rh: 5100
Unit Type and Rh: 5100
Unit Type and Rh: 5100

## 2020-09-16 LAB — BASIC METABOLIC PANEL
Anion gap: 13 (ref 5–15)
BUN: 39 mg/dL — ABNORMAL HIGH (ref 6–20)
CO2: 24 mmol/L (ref 22–32)
Calcium: 6.3 mg/dL — CL (ref 8.9–10.3)
Chloride: 99 mmol/L (ref 98–111)
Creatinine, Ser: 3.63 mg/dL — ABNORMAL HIGH (ref 0.44–1.00)
GFR calc Af Amer: 15 mL/min — ABNORMAL LOW (ref >60)
GFR calc non Af Amer: 13 mL/min — ABNORMAL LOW (ref >60)
Glucose, Bld: 112 mg/dL — ABNORMAL HIGH (ref 70–99)
Potassium: 4.1 mmol/L (ref 3.5–5.1)
Sodium: 136 mmol/L (ref 135–145)

## 2020-09-16 LAB — TYPE AND SCREEN
ABO/RH(D): O POS
Antibody Screen: NEGATIVE
Unit division: 0
Unit division: 0
Unit division: 0
Unit division: 0

## 2020-09-16 LAB — GLUCOSE, CAPILLARY
Glucose-Capillary: 102 mg/dL — ABNORMAL HIGH (ref 70–99)
Glucose-Capillary: 109 mg/dL — ABNORMAL HIGH (ref 70–99)
Glucose-Capillary: 112 mg/dL — ABNORMAL HIGH (ref 70–99)
Glucose-Capillary: 102 mg/dL — ABNORMAL HIGH (ref 70–99)
Glucose-Capillary: 108 mg/dL — ABNORMAL HIGH (ref 70–99)

## 2020-09-16 LAB — CBC WITH DIFFERENTIAL/PLATELET
Abs Immature Granulocytes: 0.11 10*3/uL — ABNORMAL HIGH (ref 0.00–0.07)
Basophils Absolute: 0 10*3/uL (ref 0.0–0.1)
Basophils Relative: 0 %
Eosinophils Absolute: 0.1 10*3/uL (ref 0.0–0.5)
Eosinophils Relative: 2 %
HCT: 22.2 % — ABNORMAL LOW (ref 36.0–46.0)
Hemoglobin: 7.1 g/dL — ABNORMAL LOW (ref 12.0–15.0)
Immature Granulocytes: 3 %
Lymphocytes Relative: 28 %
Lymphs Abs: 1.3 10*3/uL (ref 0.7–4.0)
MCH: 28.9 pg (ref 26.0–34.0)
MCHC: 32 g/dL (ref 30.0–36.0)
MCV: 90.2 fL (ref 80.0–100.0)
Monocytes Absolute: 0.3 10*3/uL (ref 0.1–1.0)
Monocytes Relative: 6 %
Neutro Abs: 2.7 10*3/uL (ref 1.7–7.7)
Neutrophils Relative %: 61 %
Platelets: 109 10*3/uL — ABNORMAL LOW (ref 150–400)
RBC: 2.46 MIL/uL — ABNORMAL LOW (ref 3.87–5.11)
RDW: 17.5 % — ABNORMAL HIGH (ref 11.5–15.5)
WBC: 4.5 10*3/uL (ref 4.0–10.5)
nRBC: 0.4 % — ABNORMAL HIGH (ref 0.0–0.2)

## 2020-09-16 LAB — MAGNESIUM: Magnesium: 2 mg/dL (ref 1.7–2.4)

## 2020-09-16 LAB — PHOSPHORUS: Phosphorus: 2.3 mg/dL — ABNORMAL LOW (ref 2.5–4.6)

## 2020-09-16 LAB — TRIGLYCERIDES: Triglycerides: 88 mg/dL (ref <150)

## 2020-09-16 LAB — VITAMIN D 25 HYDROXY (VIT D DEFICIENCY, FRACTURES): Vit D, 25-Hydroxy: 10.73 ng/mL — ABNORMAL LOW (ref 30–100)

## 2020-09-16 MED ORDER — CALCIUM CARBONATE ANTACID 500 MG PO CHEW
2.0000 | CHEWABLE_TABLET | Freq: Three times a day (TID) | ORAL | Status: AC
  Filled 2020-09-16 (×3): qty 2

## 2020-09-16 MED ORDER — VITAMIN D (ERGOCALCIFEROL) 1.25 MG (50000 UNIT) PO CAPS
50000.0000 [IU] | ORAL_CAPSULE | ORAL | Status: DC
  Filled 2020-09-16: qty 1

## 2020-09-16 MED ORDER — SODIUM PHOSPHATES 45 MMOLE/15ML IV SOLN
20.0000 mmol | Freq: Once | INTRAVENOUS | Status: AC
  Administered 2020-09-16: 20 mmol via INTRAVENOUS
  Filled 2020-09-16: qty 6.67

## 2020-09-16 MED ORDER — CALCIUM GLUCONATE-NACL 1-0.675 GM/50ML-% IV SOLN
1.0000 g | Freq: Once | INTRAVENOUS | Status: DC
  Filled 2020-09-16: qty 50

## 2020-09-16 MED ORDER — MEGESTROL ACETATE 40 MG PO TABS
40.0000 mg | ORAL_TABLET | Freq: Two times a day (BID) | ORAL | Status: DC
  Filled 2020-09-16 (×3): qty 1

## 2020-09-16 MED ORDER — BISACODYL 10 MG RE SUPP: 10.0000 mg | Freq: Every day | RECTAL | Status: DC | PRN

## 2020-09-16 NOTE — Progress Notes (Addendum)
HD#31 Subjective:   Overnight: No acute events. Calcium repleted.   Patient evaluated at bedside during rounds. She is laying in bed, appears comfortable, in no acute distress, though difficult to understand due to hypophonia. Reports that she is passing gas. Has not had a BM yet.   Objective:   Vital signs in last 24 hours: Vitals:   09/15/20 0916 09/15/20 1614 09/15/20 2121 09/16/20 0456  BP: 120/71 (!) 113/50 130/74 120/72  Pulse: 90 92 98 93  Resp: 18 18 18 18   Temp: (!) 97.5 F (36.4 C) (!) 97.5 F (36.4 C) 97.8 F (36.6 C) 97.8 F (36.6 C)  TempSrc: Oral Oral Oral   SpO2: 100% 100% 100% 100%  Weight:      Height:       Supplemental O2: Room Air SpO2: 100 % O2 Flow Rate (L/min): 2 L/min FiO2 (%): 40 %  Physical Exam Constitutional: Obese, sitting in bed, in no acute distress Cardiovascular: regular rate and rhythm, no m/r/g Pulmonary/Chest: normal work of breathing on 2L [place], lungs clear to auscultation bilaterally Abdominal: soft, non-distended, midline dressing in place with no erythema, purulence, or drainage.  Neurological: alert & oriented x 3, hypophonic and slightly slurred speech, somewhat difficult to understand. Globally weak, not moving right upper extremity Psych: Flat affect  Filed Weights   09/28/2020 1408 09/13/20 0300 09/14/20 0500  Weight: (!) 139.3 kg (!) 145.6 kg (!) 146.1 kg    Intake/Output Summary (Last 24 hours) at 09/16/2020 0723 Last data filed at 09/16/2020 0615 Gross per 24 hour  Intake 2506.38 ml  Output 600 ml  Net 1906.38 ml   Net IO Since Admission: 38,864.08 mL [09/16/20 0723]  Pertinent Labs: CBC Latest Ref Rng & Units 09/16/2020 09/15/2020 09/15/2020  WBC 4.0 - 10.5 K/uL 4.5 4.4 4.3  Hemoglobin 12.0 - 15.0 g/dL 7.1(L) 7.1(L) 7.1(L)  Hematocrit 36 - 46 % 22.2(L) 22.0(L) 22.2(L)  Platelets 150 - 400 K/uL 109(L) 112(L) 122(L)    CMP Latest Ref Rng & Units 09/16/2020 09/15/2020 09/14/2020  Glucose 70 - 99 mg/dL 112(H) 129(H) -   BUN 6 - 20 mg/dL 39(H) 38(H) -  Creatinine 0.44 - 1.00 mg/dL 3.63(H) 3.60(H) -  Sodium 135 - 145 mmol/L 136 136 137  Potassium 3.5 - 5.1 mmol/L 4.1 3.6 4.5  Chloride 98 - 111 mmol/L 99 99 -  CO2 22 - 32 mmol/L 24 23 -  Calcium 8.9 - 10.3 mg/dL 6.3(LL) 6.3(LL) -  Total Protein 6.5 - 8.1 g/dL - - -  Total Bilirubin 0.3 - 1.2 mg/dL - - -  Alkaline Phos 38 - 126 U/L - - -  AST 15 - 41 U/L - - -  ALT 0 - 44 U/L - - -    Imaging: No results found.  Assessment/Plan:   Active Problems:   Rhabdomyolysis   Hypokalemia   Hypocalcemia   Acute renal failure (HCC)   Malnutrition of moderate degree   Palliative care by specialist   DNR (do not resuscitate) discussion   Lethargy   DNR (do not resuscitate)   Weakness generalized   Vaginal bleeding   Colitis   Chronic gastritis without bleeding   Morbid obesity ([place])   Hiatal hernia   Non-intractable vomiting   Regurgitation of food   Iron deficiency anemia due to chronic blood loss   Anemia   Pressure injury of skin   Patient Summary: 59 year old female with past medical history of stroke with residual right-sided weakness, Who initially presented  with poor p.o. intake, weakness, diarrhea, severe electrolyte abnormality and AKI/ATN.  SBO in setting of ventral hernia and adhesions s/p laparotomy POD 4 S/p exploratory laparotomy, lysis of adhesions, and incisional hernia repair with mesh on 10/01. Passing Flatus, but no BM yet. Surgical site well dressed with no signs of infection.  - Advancing to dysphagia 2 diet per surgery - Continue monitoring for BM - Trend CBC - Appreciate surgical team's recommendations  AKI Severe electrolytes abnormality: Hypo K, hypo Ca, hypo Mg Prolonged QTc Compensated metabolic acidosis On arrival, Cr was 10.2 (was 1.7 on 2019). CK 1266>resolved. Kidney function gradualy improved w IVF and management by nephrology, we are continuing her on bicarbonate for continued low pH (L[place]ley  RTA1). Electrolyte derangement: K< 2, corrected Ca ~6.4, Likely 2/2 poor p.o. intake and diarrhea. Improved. - AI/ATN improved - Trend CMP  - Trend K, Mg and Calcium to keep K>4 and Mg>2 - Vit D 25 Oh: Decreased to 10.73 - Calcitriol  PTH: pending - Continue IV Na Bicarb. Will switch to PO when patient tolerates better oral intake.  Anemia Vaginal bleeding and likely surgery blood loss Required transfusion earlier this admission. (RBC transfusion 9/7 and 9/12) - Hgb: 7.1, will continue to monitor with serial CBC - Resume Megestrol when patient better tolerates PO intake. - Was seen by OBGYN this admission. F/u o/p with OBGYN and continue megestrol  Esophageal dysmotility: Diarrhea & Colitis:  Earlier this admission. Improved. likely viral.  C--diff and GI panel were negative.  Got metronidazole (9/5>9/7). GI performed EGD and flex sigmoidoscopy 9/10: w/o active bleeding negative H pylori. On Diltiazem for esophageal dysmotility. - On phenergan,  monitoring QTc.  Prior to Admission Living Arrangement: Anticipated Discharge Location: Barriers to Discharge: Dispo: Anticipated discharge depend on clinical improvement  Diet: Clear liquid diet IVF: Na bicarb 100 cc/hr VTE: Heparin Code: DNR  Please contact the on call pager after 5 pm and on weekends at [phone].  Alexandria Lodge, MD PGY-1 Internal Medicine Teaching Service Pager: [phone] 09/16/2020

## 2020-09-16 NOTE — Progress Notes (Signed)
[facility] Progress Note  4 Days Post-Op  Subjective: CC-  Difficult to understand, but denies any abdominal pain. No n/v. Passing some flatus, no BM. Not taking in much but tolerating clear liquids.  Objective: Vital signs in last 24 hours: Temp:  [97.5 F (36.4 C)-97.8 F (36.6 C)] 97.8 F (36.6 C) (10/05 0456) Pulse Rate:  [90-98] 93 (10/05 0456) Resp:  [18] 18 (10/05 0456) BP: (113-130)/(50-74) 120/72 (10/05 0456) SpO2:  [100 %] 100 % (10/05 0456) Last BM Date: 09/09/20  Intake/Output from previous day: 10/04 0701 - 10/05 0700 In: 2506.4 [P.O.:720; I.V.:1736.4; IV Piggyback:50] Out: 600 [Urine:600] Intake/Output this shift: No intake/output data recorded.  PE: Gen:  Alert, NAD Pulm:  rate and effort normal Abd: open midline wound clean without drainage, no cellulitis, retention sutures in place   Lab Results:  Recent Labs    09/15/20 2359 09/16/20 0335  WBC 4.4 4.5  HGB 7.1* 7.1*  HCT 22.0* 22.2*  PLT 112* 109*   BMET Recent Labs    09/15/20 1013 09/16/20 0335  NA 136 136  K 3.6 4.1  CL 99 99  CO2 23 24  GLUCOSE 129* 112*  BUN 38* 39*  CREATININE 3.60* 3.63*  CALCIUM 6.3* 6.3*   PT/INR No results for input(s): LABPROT, INR in the last 72 hours. CMP     Component Value Date/Time   NA 136 09/16/2020 0335   K 4.1 09/16/2020 0335   CL 99 09/16/2020 0335   CO2 24 09/16/2020 0335   GLUCOSE 112 (H) 09/16/2020 0335   BUN 39 (H) 09/16/2020 0335   CREATININE 3.63 (H) 09/16/2020 0335   CALCIUM 6.3 (LL) 09/16/2020 0335   PROT 4.8 (L) 09/14/2020 0412   ALBUMIN 2.0 (L) 09/14/2020 0412   AST 25 09/14/2020 0412   ALT 28 09/14/2020 0412   ALKPHOS 66 09/14/2020 0412   BILITOT 0.3 09/14/2020 0412   GFRNONAA 13 (L) 09/16/2020 0335   GFRAA 15 (L) 09/16/2020 0335   Lipase     Component Value Date/Time   LIPASE 263 (H) 08/17/2020 0505       Studies/Results: No results found.  Anti-infectives: Anti-infectives (From admission,  onward)   Start     Dose/Rate Route Frequency Ordered Stop   10/10/2020 1400  cefoTEtan (CEFOTAN) 2 g in sodium chloride 0.9 % 100 mL IVPB       Note to Pharmacy: Pharmacy may adjust dose strength for optimal dosing.   Send with patient on call to the OR.  Anesthesia to complete antibiotic administration <31min prior to incision per [facility].   2 g 200 mL/hr over 30 Minutes Intravenous On call to O.R. 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 Advance to D2 diet, add bowel regimen colace/miralax. - BID wet to dry  dressing changes, continue retention sutures - abdominal binder  ID - none currently FEN - D2 diet VTE - SCDs, sq heparin Foley - in place Follow up - Dr. Donne Hazel    LOS: 67 days    Wellington Hampshire, [facility] 09/16/2020, 8:26 AM Please see Amion for pager number during day hours 7:00am-4:30pm

## 2020-09-17 DIAGNOSIS — E559 Vitamin D deficiency, unspecified: Secondary | ICD-10-CM | POA: Diagnosis present

## 2020-09-17 LAB — GLUCOSE, CAPILLARY
Glucose-Capillary: 113 mg/dL — ABNORMAL HIGH (ref 70–99)
Glucose-Capillary: 117 mg/dL — ABNORMAL HIGH (ref 70–99)
Glucose-Capillary: 117 mg/dL — ABNORMAL HIGH (ref 70–99)

## 2020-09-17 LAB — BASIC METABOLIC PANEL
Anion gap: 15 (ref 5–15)
BUN: 42 mg/dL — ABNORMAL HIGH (ref 6–20)
CO2: 23 mmol/L (ref 22–32)
Calcium: 5.9 mg/dL — CL (ref 8.9–10.3)
Chloride: 97 mmol/L — ABNORMAL LOW (ref 98–111)
Creatinine, Ser: 3.86 mg/dL — ABNORMAL HIGH (ref 0.44–1.00)
GFR calc non Af Amer: 12 mL/min — ABNORMAL LOW (ref >60)
Glucose, Bld: 110 mg/dL — ABNORMAL HIGH (ref 70–99)
Potassium: 4 mmol/L (ref 3.5–5.1)
Sodium: 135 mmol/L (ref 135–145)

## 2020-09-17 LAB — PHOSPHORUS: Phosphorus: 3.1 mg/dL (ref 2.5–4.6)

## 2020-09-17 LAB — CBC WITH DIFFERENTIAL/PLATELET
Abs Immature Granulocytes: 0.12 10*3/uL — ABNORMAL HIGH (ref 0.00–0.07)
Basophils Absolute: 0 10*3/uL (ref 0.0–0.1)
Basophils Relative: 0 %
Eosinophils Absolute: 0.1 10*3/uL (ref 0.0–0.5)
Eosinophils Relative: 1 %
HCT: 21.1 % — ABNORMAL LOW (ref 36.0–46.0)
Hemoglobin: 7 g/dL — ABNORMAL LOW (ref 12.0–15.0)
Immature Granulocytes: 2 %
Lymphocytes Relative: 25 %
Lymphs Abs: 1.5 10*3/uL (ref 0.7–4.0)
MCH: 29.8 pg (ref 26.0–34.0)
MCHC: 33.2 g/dL (ref 30.0–36.0)
MCV: 89.8 fL (ref 80.0–100.0)
Monocytes Absolute: 0.3 10*3/uL (ref 0.1–1.0)
Monocytes Relative: 5 %
Neutro Abs: 4.1 10*3/uL (ref 1.7–7.7)
Neutrophils Relative %: 67 %
Platelets: 84 10*3/uL — ABNORMAL LOW (ref 150–400)
RBC: 2.35 MIL/uL — ABNORMAL LOW (ref 3.87–5.11)
RDW: 17.6 % — ABNORMAL HIGH (ref 11.5–15.5)
WBC: 6.2 10*3/uL (ref 4.0–10.5)
nRBC: 0.3 % — ABNORMAL HIGH (ref 0.0–0.2)

## 2020-09-17 LAB — PARATHYROID HORMONE, INTACT (NO CA): PTH: 282 pg/mL — ABNORMAL HIGH (ref 15–65)

## 2020-09-17 LAB — CALCITRIOL (1,25 DI-OH VIT D): Vit D, 1,25-Dihydroxy: <5 pg/mL — ABNORMAL LOW (ref 19.9–79.3)

## 2020-09-17 LAB — ALBUMIN: Albumin: 1.8 g/dL — ABNORMAL LOW (ref 3.5–5.0)

## 2020-09-17 LAB — MAGNESIUM: Magnesium: 1.9 mg/dL (ref 1.7–2.4)

## 2020-09-17 MED ORDER — BISACODYL 10 MG RE SUPP
10.0000 mg | Freq: Every day | RECTAL | Status: DC
  Administered 2020-09-17: 10 mg via RECTAL
  Filled 2020-09-17: qty 1

## 2020-09-17 MED ORDER — MAGNESIUM SULFATE IN D5W 1-5 GM/100ML-% IV SOLN
1.0000 g | Freq: Once | INTRAVENOUS | Status: AC
  Administered 2020-09-17: 1 g via INTRAVENOUS
  Filled 2020-09-17: qty 100

## 2020-09-17 MED ORDER — CALCIUM CARBONATE ANTACID 500 MG PO CHEW: 2.0000 | CHEWABLE_TABLET | Freq: Three times a day (TID) | ORAL | Status: DC

## 2020-09-17 MED ORDER — CALCIUM GLUCONATE-NACL 1-0.675 GM/50ML-% IV SOLN
1.0000 g | Freq: Once | INTRAVENOUS | Status: AC
  Administered 2020-09-17: 1000 mg via INTRAVENOUS
  Filled 2020-09-17: qty 50

## 2020-09-17 MED ORDER — MIRTAZAPINE 15 MG PO TBDP
15.0000 mg | ORAL_TABLET | Freq: Every day | ORAL | Status: DC
  Filled 2020-09-17: qty 1

## 2020-09-29 ENCOUNTER — Encounter (HOSPITAL_COMMUNITY): Payer: Self-pay | Admitting: General Surgery

## 2020-10-03 IMAGING — CT CT ABD-PELV W/O CM
2 of 4 series · 14 of 46 positions shown, 16 images · non-contrast
Comparison: CT 09/02/2020

CLINICAL DATA: Bowel obstruction suspected Patient with history of
colitis and diarrhea now days without bowel movement and possible
ileus on XR

EXAM:
CT ABDOMEN AND PELVIS WITHOUT CONTRAST
TECHNIQUE: Multidetector CT imaging of the abdomen and pelvis was performed
following the standard protocol without IV contrast.

[Series 3: a/p w/o 5mm · axial · non-contrast · 0.98mm/px · z∈[+840,+1240]mm · 11 of 96 slices shown, 13 images]
[im 8/96  soft-tissue]
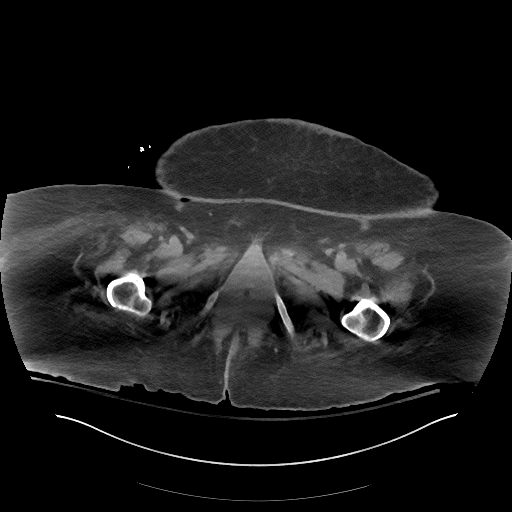
[im 8/96  bone]
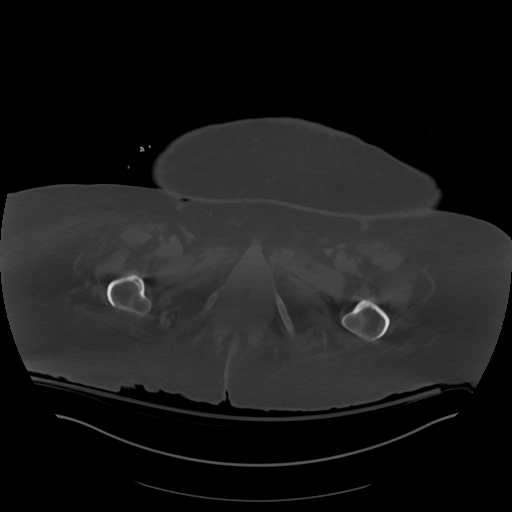
[im 16/96  soft-tissue]
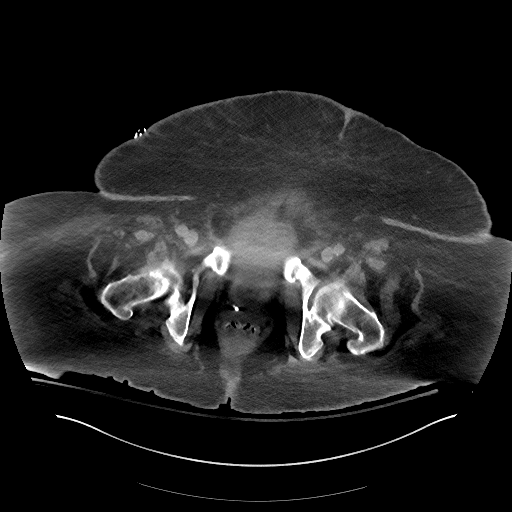
[im 23/96  soft-tissue]
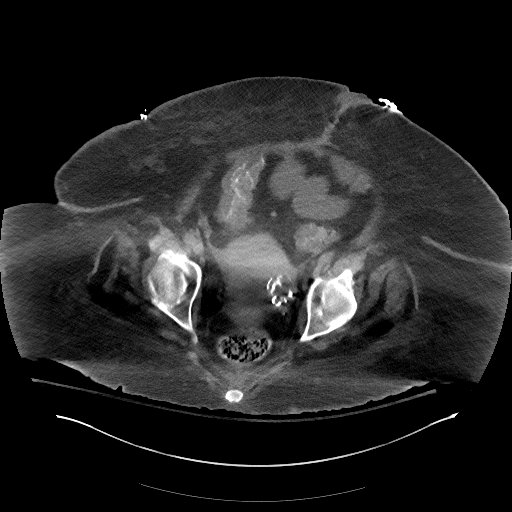
[im 31/96  soft-tissue]
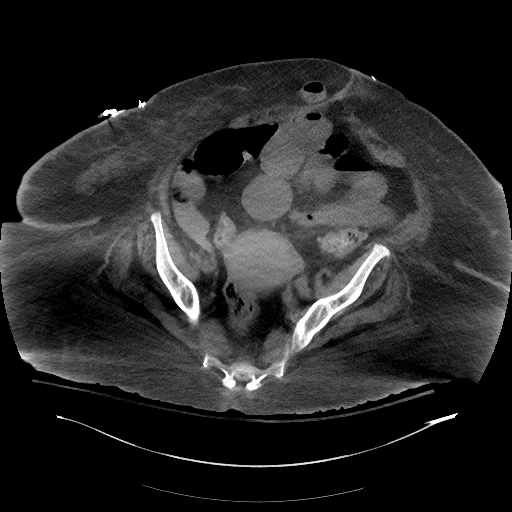
[im 39/96  soft-tissue]
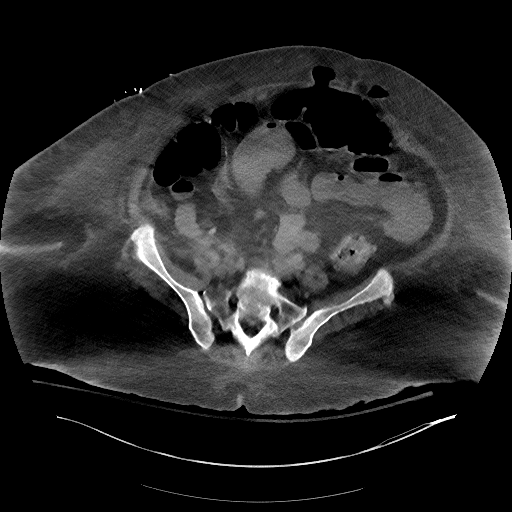
[im 50/96  soft-tissue]
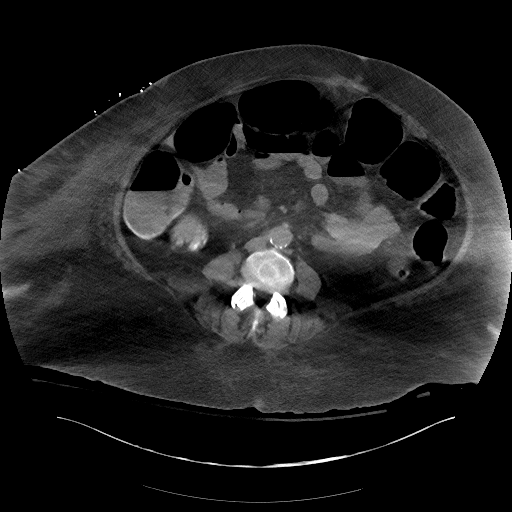
[im 58/96  soft-tissue]
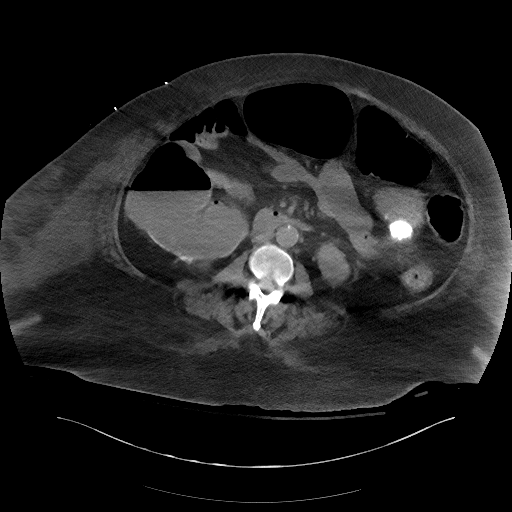
[im 65/96  soft-tissue]
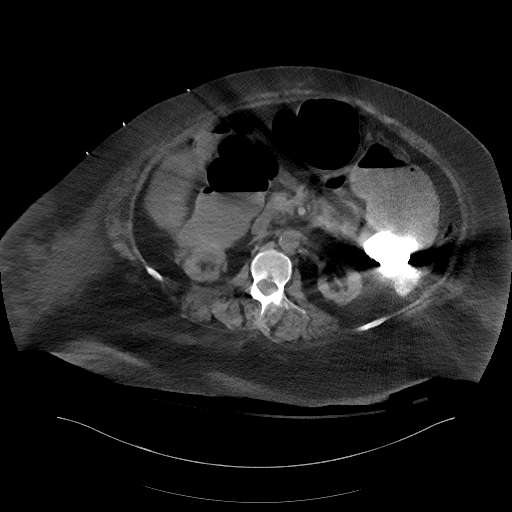
[im 73/96  soft-tissue]
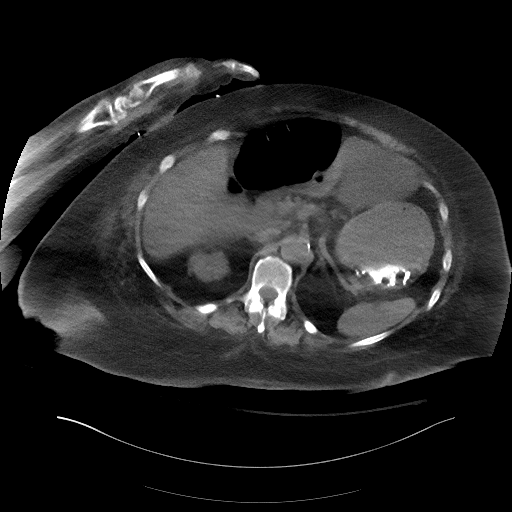
[im 73/96  bone]
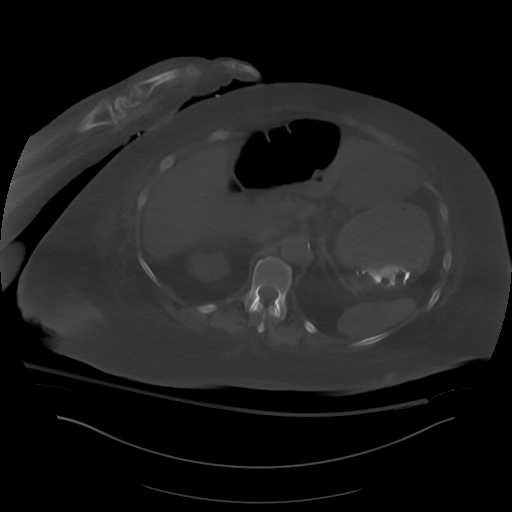
[im 80/96  soft-tissue]
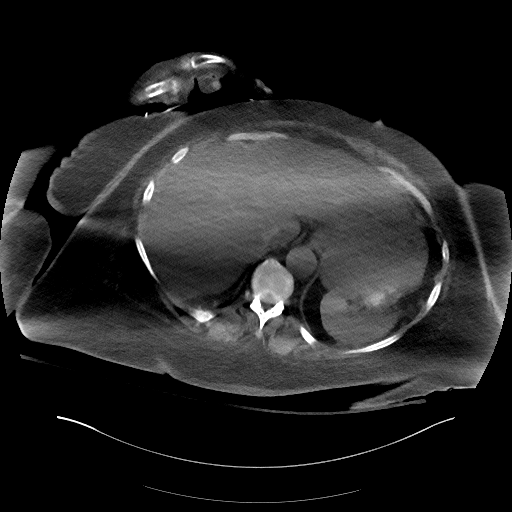
[im 88/96  soft-tissue]
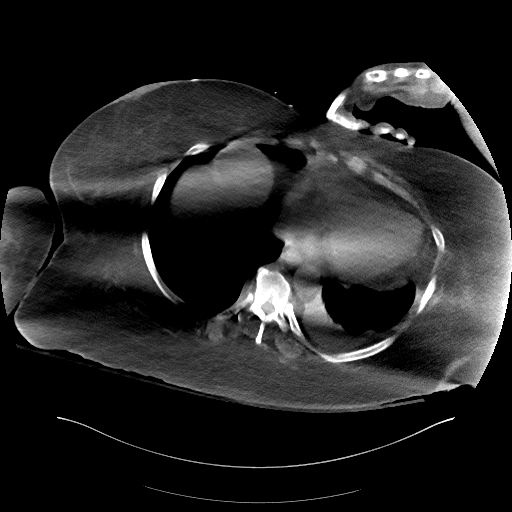

[Series 6: a/p w/o cor · coronal · non-contrast · 0.94mm/px · 3 of 186 slices shown]
[im 62/186  soft-tissue]
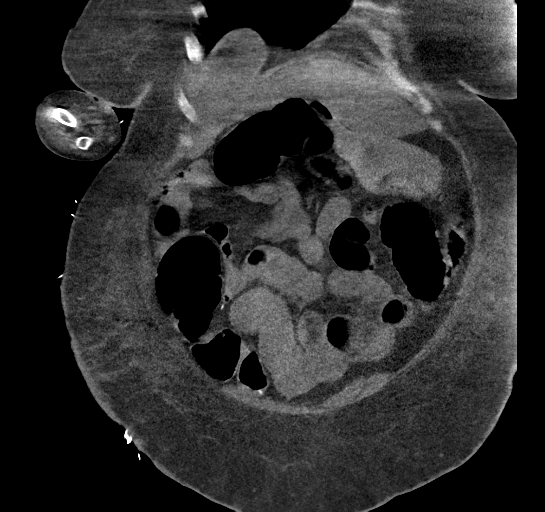
[im 83/186  soft-tissue]
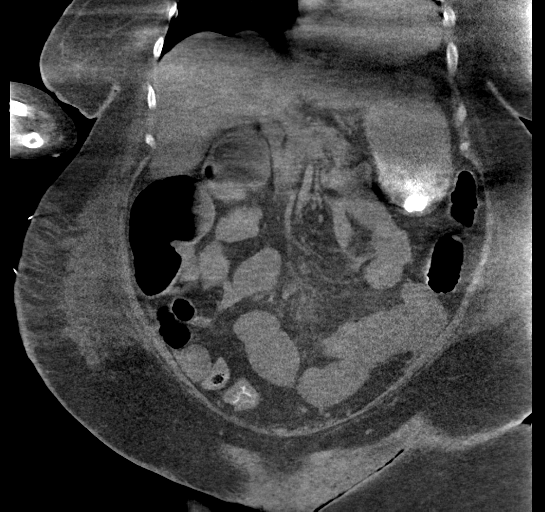
[im 103/186  soft-tissue]
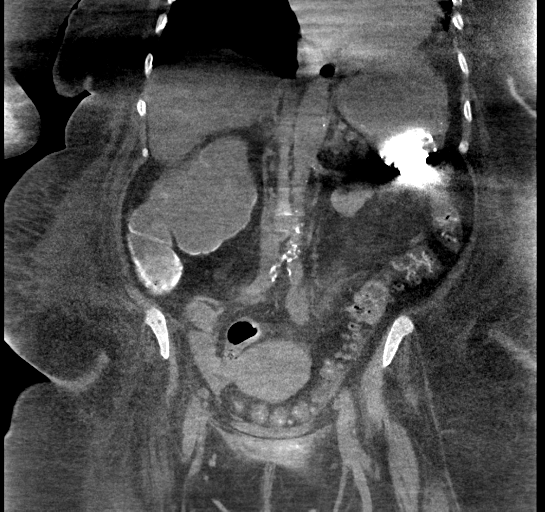

[14 of 46 positions shown; findings below may reference images not displayed]

FINDINGS: Lower chest: Small left pleural effusion with adjacent compressive
atelectasis. Pleural effusion is unchanged from prior exam. Heart is
normal in size. Motion artifact limits basilar assessment.

Hepatobiliary: Limited by motion artifact, soft tissue attenuation
from habitus, and streak artifact from arms down positioning.
Hepatic steatosis. No obvious focal abnormality, detailed assessment
is limited. Cholecystectomy without biliary dilatation.

Pancreas: Equivocal edema adjacent to the pancreatic head, series 3,
image 30. Alternatively this may be related to motion artifact.
There is no ductal dilatation or peripancreatic collection.

Spleen: Normal in size without focal abnormality.

Adrenals/Urinary Tract: No adrenal nodule. Bilateral renal
parenchymal thinning. No hydronephrosis. Portions of the left kidney
are obscured by dense contrast in the adjacent bowel. No renal
calculi. Decompressed ureters without evidence of ureteral stone.
Urinary bladder is decompressed. Foley catheter in place.

Stomach/Bowel: There is circumferential colonic wall thickening
extending from the splenic flexure through the sigmoid. The degree
of colonic wall thickening has slightly increased from prior exam.
The more proximal colon is air distended. No wall thickening of the
air distended portions. Richter hernia of the mid transverse colon
to the right on the left of the umbilicus with herniation of the
anti mesenteric border of transverse colon, series 3 images 56 and
50 no associated wall thickening or obstruction. There is liquid
stool in the cecum and ascending colon. Normal appendix. Ventral
abdominal wall hernia in the lower abdominal wall to the right of
midline contains short segment of small bowel, series 3, image 65,
there is no small bowel wall thickening. More proximal small bowel
are slightly dilated and fluid-filled, small-bowel distention of
cm. The distal small bowel loops are decompressed. Air fluid
distention of the stomach. There is no gastric or small bowel wall
thickening or inflammatory change. There is no bowel pneumatosis.

Vascular/Lymphatic: No portal venous or mesenteric gas. Aortic
atherosclerosis. No aortic aneurysm. There is no bulky
abdominopelvic adenopathy.

Reproductive: Heterogeneously enlarged uterus with fibroids,
including a calcified fibroid in the left lower uterine body. There
is questionable endometrial thickening, though not well assessed on
the current exam.

Other: No free air or ascites. Ventral abdominal wall hernias as
described above.

Musculoskeletal: Diffuse degenerative change in the spine. There are
no acute or suspicious osseous abnormalities.
IMPRESSION: 1. Infraumbilical hernia containing short segment of small bowel may
be causing proximal obstruction.
2. Persistent circumferential colonic wall thickening extending from
the splenic flexure through the sigmoid, consistent with colitis.
The degree of colonic wall thickening has slightly increased from
prior exam. There is gaseous distension of the more proximal colon.
3. Two separate Kiimi hernias of the anterior abdominal wall
containing anti mesenteric border of transverse colon. No associated
wall thickening. No associated colonic obstruction.
4. Prominent colonic diverticulosis without focal diverticulitis.
5. Equivocal edema adjacent to the pancreatic head, which may be
related to motion artifact or pancreatitis. Recommend correlation
with pancreatic enzymes.
6. Hepatic steatosis.
7. Heterogeneously enlarged uterus with fibroids, including a
calcified fibroid in the left lower uterine body. There is
questionable endometrial thickening, though not well assessed on the
current exam. Recommend nonemergent pelvic ultrasound for
characterization on an elective outpatient basis.
8. Small left pleural effusion with adjacent compressive
atelectasis, unchanged from prior exam.

Aortic Atherosclerosis ([id number]-FBJ.J).

## 2020-10-06 IMAGING — DX DG CHEST 1V PORT
1 series · 1 of 1 positions shown · non-contrast
Comparison: September 12, 2020

CLINICAL DATA: Respiratory failure

EXAM:
PORTABLE CHEST 1 VIEW

[chest ap]
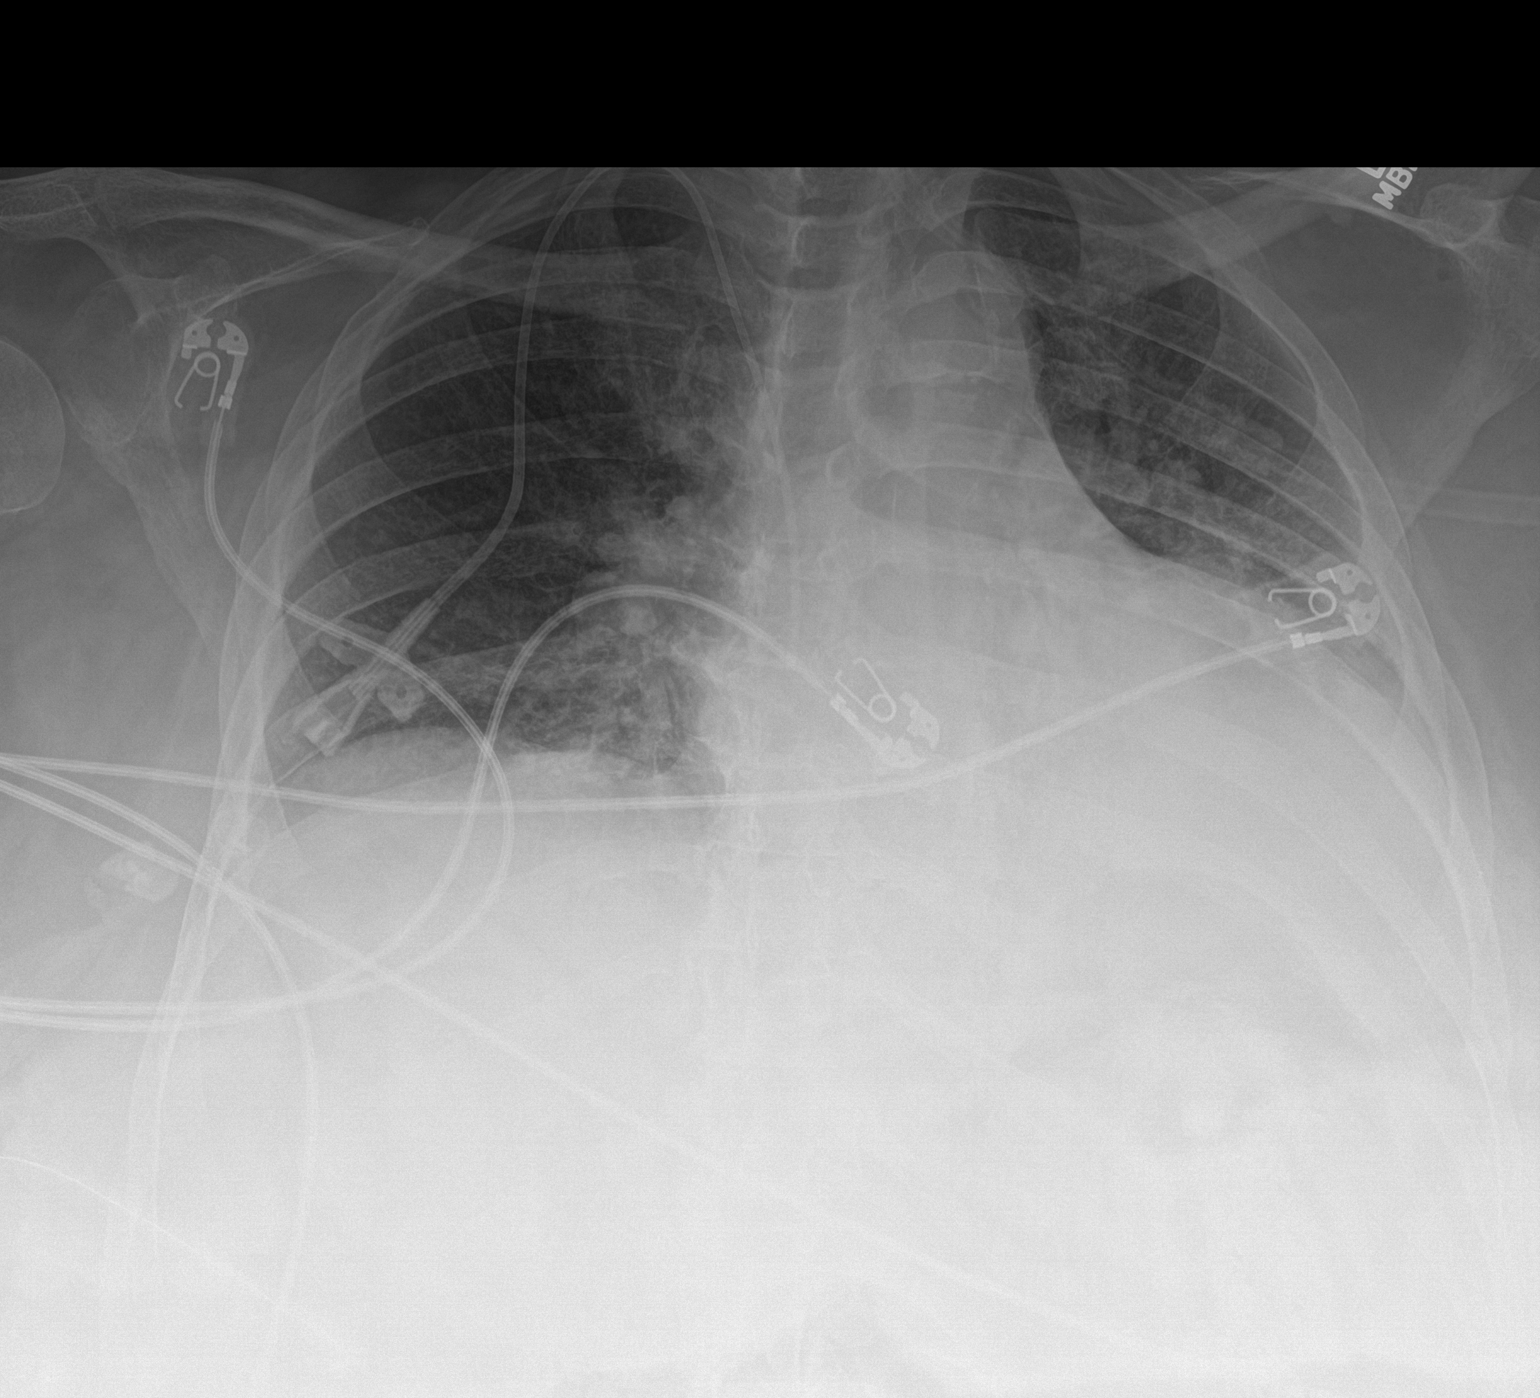

[1 of 1 positions shown; findings below may reference images not displayed]

FINDINGS: The cardiomediastinal silhouette is unchanged and enlarged in
contour.RIGHT chest CVC tip terminates over the RIGHT atrium.
Interval extubation removal of enteric tube. Low lung volumes with
bronchovascular crowding. Small LEFT pleural effusion. No
pneumothorax. Persistent homogeneous opacification of the LEFT lung
base likely reflecting atelectasis. Visualized abdomen is
unremarkable. Multilevel degenerative changes of the thoracic spine.
IMPRESSION: 1. Low lung volumes with bronchovascular crowding.
2. Small LEFT pleural effusion.
3. Persistent retrocardiac opacity likely reflecting atelectasis.

## 2020-10-13 NOTE — Progress Notes (Signed)
RN assessing patient and notice patient is not able to answer any orientation questions. Pt's eyes are open , but pt unable to speak; which is new to this RN. RN called MD ; which she is reporting that they have come by this morning to check on the patient and noticed and that she was like that yesterday. RN checked vitals see below and vitals are stable. MD ordered to continue to monitor,    09-29-2020 0829  Vitals  Temp (!) 97.4 F (36.3 C)  Temp Source Axillary  BP (!) 110/49  MAP (mmHg) 97  BP Location Left Arm  BP Method Automatic  Patient Position (if appropriate) Lying  Pulse Rate 86  Pulse Rate Source Monitor  Resp 12  MEWS COLOR  MEWS Score Color Green  Oxygen Therapy  SpO2 97 %  O2 Device Room Air  MEWS Score  MEWS Temp 0  MEWS Systolic 0  MEWS Pulse 0  MEWS RR 1  MEWS LOC 0  MEWS Score 1

## 2020-10-13 NOTE — Progress Notes (Signed)
SLP Cancellation Note  Patient Details Name: Cristina Jordan MRN: [id number] DOB: 1961/04/21   Cancelled treatment:    F/u for swallowing.  Pt not responding to questions, not focusing gaze on examiner.  Reviewed notes from SLP on 10/3 when MS was better.  She is not sufficiently alert to try POs today- D/W RN, who stated that staff held breakfast tray due to similar concerns. SLP will follow along.   Luann Aspinwall L. Tivis Ringer, [place] CCC/SLP Acute Rehabilitation Services Office number [phone] Pager [phone]        Juan Quam Laurice 2020/09/21, 10:03 AM

## 2020-10-13 NOTE — Progress Notes (Signed)
RN was called by [place] at 1139. CCMD was reporting that pt's heart rate was  26. This RN went in immediately to check on patient and noticed pt was unresponsive, pale and large amount of green/dark stool coming from patient mouth. This RN tried to palpate pulse on Radial, Femoral, carotid without any success. Charge Nurse Wyvonnia Lora attempted to feel for pulse without any success. Medical team arrived to the floor and verified death. Death occurred at 45 AM. Medical team advised that they would call family to inform of patients passing.

## 2020-10-13 NOTE — Progress Notes (Signed)
[facility] Progress Note  5 Days Post-Op  Subjective: CC-  Daughter at bedside. Patient more lethargic this morning. She does open eyes to verbal stimulation and attempt to answer questions, but she is somewhat more difficult to understand today. Cannot tell me if she is passing any flatus. No BM. Taking in minimal PO. No emesis. WBC 6.2, afebrile  Objective: Vital signs in last 24 hours: Temp:  [97.4 F (36.3 C)-98 F (36.7 C)] 97.4 F (36.3 C) (10/06 0829) Pulse Rate:  [86-99] 86 (10/06 0829) Resp:  [12-18] 12 (10/06 0829) BP: (97-117)/(41-65) 110/49 (10/06 0829) SpO2:  [97 %-100 %] 97 % (10/06 0829) Last BM Date: 09/09/20  Intake/Output from previous day: 10/05 0701 - 10/06 0700 In: 1758.7 [I.V.:1758.7] Out: 1000 [Urine:1000] Intake/Output this shift: No intake/output data recorded.  PE: Gen: Lethargic but opens eyes to verbal stimulation, NAD Pulm: rate and effort normal Abd: open midline wound clean without drainage, no cellulitis or drainage, retention sutures in place, +BS, nontender  Lab Results:  Recent Labs    09/16/20 0335 09/27/20 0344  WBC 4.5 6.2  HGB 7.1* 7.0*  HCT 22.2* 21.1*  PLT 109* 84*   BMET Recent Labs    09/16/20 0335 27-Sep-2020 0344  NA 136 135  K 4.1 4.0  CL 99 97*  CO2 24 23  GLUCOSE 112* 110*  BUN 39* 42*  CREATININE 3.63* 3.86*  CALCIUM 6.3* 5.9*   PT/INR No results for input(s): LABPROT, INR in the last 72 hours. CMP     Component Value Date/Time   NA 135 September 27, 2020 0344   K 4.0 09/27/2020 0344   CL 97 (L) 09/27/20 0344   CO2 23 2020/09/27 0344   GLUCOSE 110 (H) 09/27/2020 0344   BUN 42 (H) Sep 27, 2020 0344   CREATININE 3.86 (H) 09/27/20 0344   CALCIUM 5.9 (LL) 2020-09-27 0344   PROT 4.8 (L) 09/14/2020 0412   ALBUMIN 1.8 (L) 09/27/2020 0344   AST 25 09/14/2020 0412   ALT 28 09/14/2020 0412   ALKPHOS 66 09/14/2020 0412   BILITOT 0.3 09/14/2020 0412   GFRNONAA 12 (L) September 27, 2020 0344   GFRAA 15  (L) 09/16/2020 0335   Lipase     Component Value Date/Time   LIPASE 263 (H) 08/17/2020 0505       Studies/Results: No results found.  Anti-infectives: Anti-infectives (From admission, onward)   Start     Dose/Rate Route Frequency Ordered Stop   10/11/2020 1400  cefoTEtan (CEFOTAN) 2 g in sodium chloride 0.9 % 100 mL IVPB       Note to Pharmacy: Pharmacy may adjust dose strength for optimal dosing.   Send with patient on call to the OR.  Anesthesia to complete antibiotic administration <58min prior to incision per [facility].   2 g 200 mL/hr over 30 Minutes Intravenous On call to O.R. 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 Her prealbumin is 5.5. I think that she will need some sort of supplemental nutrition. Recommend Cortrak and initiation of tube feedings - this was communicated with the primary team. - continue bowel regimen - abdominal binder  ID -none currently FEN -D2 diet VTE -SCDs, sq heparin Foley -in place Follow up -Dr. Donne Hazel    LOS: 32 days    Wellington Hampshire, [facility] 2020-09-29, 9:41 AM Please see Amion  for pager number during day hours 7:00am-4:30pm

## 2020-10-13 NOTE — Discharge Summary (Signed)
Name: Shir Bergman MRN: [id number] DOB: Jun 14, 1961 59 y.o.  Date of Admission: 08/25/2020 12:27 PM Date of Discharge: 09/18/2020 Attending Physician: Joni Reining, MD  Discharge Diagnosis: Principal Problem:   Colitis Active Problems:   Rhabdomyolysis   Hypokalemia   Hypocalcemia   Acute renal failure (HCC)   Malnutrition of moderate degree   Palliative care by specialist   DNR (do not resuscitate) discussion   Lethargy   DNR (do not resuscitate)   Weakness generalized   Vaginal bleeding   Chronic gastritis without bleeding   Morbid obesity ([place])   Hiatal hernia   Non-intractable vomiting   Regurgitation of food   Iron deficiency anemia due to chronic blood loss   Anemia   Pressure injury of skin   Vitamin D deficiency   Hypomagnesemia  Cause of death:   Cardiac arrest secondary to complications following prolonged colitis  Time of death: 11:40 am  Disposition and follow-up:    Ms.Dayra Syler was discharged from [facility] in expired condition.    Hospital Course:   Ms. Nafisa Olds was a 59 year old woman with history of stroke in 2017 with residual right-sided weakness, hypertension, wheelchair bound, who presented from home with 1-week of worsening weakness in the setting of two weeks of nausea, vomiting, abdominal pain, and diarrhea. She also had all of her teeth removed a few days prior to presentation. She was found to have severe acute kidney injury (serum creatinine 10.2 vs 1.7 in 2019), and nephrology was consulted. Renal ultrasound showed increased echogenicity consistent with medical renal disease. Palliative care was consulted, and she was changed from full code to DNR/DNI and discussions regarding complications of renal disease and advantages of comfort care were discussed. She also had several electrolyte derangements, including hypokalemia, hypocalcemia, hypomagnesemia. CT abdomen pelvis was suggestive of diffuse colonic wall  thickening with pericolonic edema involving the descending/sigmoid colon suspicious for colitis. GI was consulted. EGD biopsies were suggestive of chronic peptic duodenitis, chronic gastritis with reactive esophagitis, and flex sigmoidoscopy consistent with likely acute, infectious, self-limited colitis. Repeat CT abdomen/pelvis 09/02/20 showed improved colitis possibly ischemic in nature with gastric inflammation. Patient's vomiting and diarrhea continued though it was somewhat improved and patient denied nausea or abdominal pain. Her vomiting was suspected to be due to an upper GI source, most likely esophageal dysmotility. Her nausea and vomiting continued, with diffuse abdominal pain and throat pain but resolved diarrhea. Esophogram showed likely mid-distal esophageal dysmotility which was suspected to be the most likely cause for her continued symptoms in addition to duodenitis, gastritis, and esophagitis.   The patient also had normocytic anemia which stabilized after several transfusions. Suspected to be due to blood loss from vaginal bleeding. Ob-gyn was consulted and recommended initiation of Megace and eventual outpatient follow-up for endometrial biopsy.  For context, nutrition was an ongoing problem over the last few weeks with the patient intermittently refusing or accepting a diet. Tube feeds were offered previously and the patient was reluctant.  On 09/11/20, repeat CT abdomen pelvis was obtained due to significant worsening of the patient's nausea, vomiting, and new high-pitched bowel sounds on physical exam. The imaging showed persistent ventral hernias with possible incarcerated bowel concerning for small bowel obstruction. Surgery was consulted immediately and patient was taken to the OR by general surgery. She had exploratory laparotomy, lysis of adhesions, and incisional hernia repair with mesh on 09/13/2020. After surgery, she required vasopressors and remained on the ventilator so was  transferred to the ICU. She was  extubated on 09/14/20 and transferred back to the floor on 09/15/20. The patient would not accept medications or food by mouth. Electrolyte replacement and IV fluids containing bicarbonate were continued, meanwhile her mental status gradually declined and during morning rounds on 10-14-2020 she would arouse to voice however not respond meaningfully other than moans. She appeared comfortable, her vitals were stable, her lungs were clear, her abdomen soft, and dressing in place was clean and dry. We formulated a plan to place a feeding tube for nutrition in consultation with surgery and nutrition as well as re-involve palliative care. However, unfortunately per nursing, patient was found to be bradycardic on telemetry and upon immediate evaluation was found by RN to be unresponsive and pulseless. Patient was DNR/DNI. Our team arrived at bedside immediately and verified death. We then contacted the patient's family to notify them.  Signed: Alexandria Lodge, MD 09/18/2020, 5:52 PM

## 2020-10-13 NOTE — Progress Notes (Signed)
HD#32 Subjective:   Overnight: No acute events. Calcium repleted. Per report, patient is passing gas but no BM yet. Patient has also been refusing most PO medications, including dysphagia diet.  Patient evaluated at bedside during rounds. Patient unresponsive to questions, quietly moans, appears comfortable. Not responding to verbal commands.    Objective:   Vital signs in last 24 hours: Vitals:   09/16/20 0834 09/16/20 1625 09/16/20 2054 2020/09/23 0550  BP: (!) 105/49 117/65 97/60 (!) 101/41  Pulse: 92 95 99 94  Resp: 14 16 18 18   Temp: (!) 97.5 F (36.4 C) (!) 97.5 F (36.4 C) 97.8 F (36.6 C) 98 F (36.7 C)  TempSrc: Oral Oral Oral   SpO2: 100% 100% 100% 98%  Weight:      Height:        Physical Exam Constitutional: Obese, laying in bed, in no acute distress Cardiovascular: regular rate and rhythm, no m/r/g Pulmonary/Chest: normal work of breathing on room air, lungs clear to auscultation bilaterally Abdominal: soft, non-distended, midline dressing in place with no erythema, purulence, or drainage.  Neurological: Patient is awake spontaneously, not responding to our questions or following commands, moans quietly, moves her head spontaneously  Pertinent Labs: CBC Latest Ref Rng & Units 09/16/2020 09/15/2020 09/15/2020  WBC 4.0 - 10.5 K/uL 4.5 4.4 4.3  Hemoglobin 12.0 - 15.0 g/dL 7.1(L) 7.1(L) 7.1(L)  Hematocrit 36 - 46 % 22.2(L) 22.0(L) 22.2(L)  Platelets 150 - 400 K/uL 109(L) 112(L) 122(L)    CMP Latest Ref Rng & Units 09/23/2020 09/16/2020 09/15/2020  Glucose 70 - 99 mg/dL 110(H) 112(H) 129(H)  BUN 6 - 20 mg/dL 42(H) 39(H) 38(H)  Creatinine 0.44 - 1.00 mg/dL 3.86(H) 3.63(H) 3.60(H)  Sodium 135 - 145 mmol/L 135 136 136  Potassium 3.5 - 5.1 mmol/L 4.0 4.1 3.6  Chloride 98 - 111 mmol/L 97(L) 99 99  CO2 22 - 32 mmol/L 23 24 23   Calcium 8.9 - 10.3 mg/dL 5.9(LL) 6.3(LL) 6.3(LL)  Total Protein 6.5 - 8.1 g/dL - - -  Total Bilirubin 0.3 - 1.2 mg/dL - - -  Alkaline Phos  38 - 126 U/L - - -  AST 15 - 41 U/L - - -  ALT 0 - 44 U/L - - -    Assessment/Plan:   Active Problems:   Rhabdomyolysis   Hypokalemia   Hypocalcemia   Acute renal failure (HCC)   Malnutrition of moderate degree   Palliative care by specialist   DNR (do not resuscitate) discussion   Lethargy   DNR (do not resuscitate)   Weakness generalized   Vaginal bleeding   Colitis   Chronic gastritis without bleeding   Morbid obesity (HCC)   Hiatal hernia   Non-intractable vomiting   Regurgitation of food   Iron deficiency anemia due to chronic blood loss   Anemia   Pressure injury of skin   Patient Summary: 59 year old female with past medical history of stroke with residual right-sided weakness, Who initially presented with poor p.o. intake, weakness, diarrhea, severe electrolyte abnormality and AKI/ATN.  SBO in setting of ventral hernia and adhesions s/p laparotomy POD 5 S/p exploratory laparotomy, lysis of adhesions, and incisional hernia repair with mesh on 10/01. Passing gas, but no BM yet. Surgical site well dressed with no signs of infection. Patient has been refusing most PO medications and all food. Will discuss initiation of tube feeds for nutrition. Hemoglobin stable at 7.0 this morning. - Will discuss with surgery initiation of tube feeds for nutrition  -  Continue monitoring for BM - Trend CBC - Appreciate surgical team's recommendations  AKI Severe electrolytes abnormality: hypo K, hypo Ca, hypo Mg Prolonged QTc Compensated metabolic acidosis On arrival, Cr was 10.2 (was 1.7 on 2019). CK 1266>resolved. Kidney function gradualy improved w IVF and management by nephrology, sCr stable, 3.83 this morning. We are continuing her on bicarbonate for continued low pH ([place] RTA1). Electrolyte derangements: hypokalemia resolved, 4.0 this morning; corrected Ca 5.9 this morning, however corrects to ~7.7 with albumin of 1.8. Slight hypo magnesemia at 1.9 this morning. Likely 2/2  poor p.o. intake. - AI/ATN improved - Trend CMP  - Trend K, Mg and Calcium to keep K>4 and Mg>2 - Vit D 25 Oh: Decreased to 10.73. Ordered Vit D however patient is not taking PO meds. - Calcitriol and PTH: pending - Continue IV Na Bicarb. Will switch to PO when patient tolerates better oral intake.  Anemia Vaginal bleeding and likely surgery blood loss Required transfusion earlier this admission. (RBC transfusion 9/7 and 9/12) - Hgb: 7.1, will continue to monitor with serial CBC - Patient is refusing PO medsResume Megestrol when patient better tolerates PO intake. - Was seen by OBGYN this admission. F/u o/p with OBGYN and continue megestrol  Esophageal dysmotility: Diarrhea & Colitis:  Earlier this admission. Improved. likely viral.  C--diff and GI panel were negative.  Got metronidazole (9/5>9/7). GI performed EGD and flex sigmoidoscopy 9/10: w/o active bleeding negative H pylori. On Diltiazem for esophageal dysmotility. - On phenergan,  monitoring QTc.  FEN/GI: - Patient has been refusing most PO medications and has not eaten dysphagia 2 diet - Will discuss initiation of tube feeds - Diet: Dysphagia 2 - IVF: Na bicarb 100 cc/hr  Disposition:  - Plan to re-involve palliative given that patient is not taking PO  VTE: Heparin Code: DNR  Please contact the on call pager after 5 pm and on weekends at [phone].  Alexandria Lodge, MD PGY-1 Internal Medicine Teaching Service Pager: [phone] 10-13-20

## 2020-10-13 NOTE — Progress Notes (Signed)
   16-Oct-2020 1500  Clinical Encounter Type  Visited With Patient and family together  Visit Type Death  Referral From Nurse  Consult/Referral To Chaplain  Spiritual Encounters  Spiritual Needs Grief support;Emotional;Prayer  The chaplain responded to page that the patient had passed away. The chaplain went into the room and daughters Information systems manager and Aldona Bar) were at bedside with a NT. The chaplain offered social, emotional, and mental support. The daughters had varying concerns and the chaplain provided active listening and spiritual support. The daughter Aldona Bar) asked the chaplain to offer prayer. The chaplain prayed and continued to listen to the daughters. The chaplain spent a significant amount of time with the family of the patient. The chaplain will follow up with nurse Laverda Sorenson) as needed to provide staff support and any support to the family. The chaplain gave the family a patient placement card.

## 2020-10-13 DEATH — deceased

## 2020-10-17 ENCOUNTER — Ambulatory Visit: Payer: Medicaid Other | Admitting: Gastroenterology
